# Patient Record
Sex: Female | Born: 1975
Health system: Southern US, Community
[De-identification: ages and names within clinical notes are randomized; demographics above are authoritative.]

## PROBLEM LIST (undated history)

## (undated) DIAGNOSIS — G43909 Migraine, unspecified, not intractable, without status migrainosus: Secondary | ICD-10-CM

## (undated) DIAGNOSIS — R569 Unspecified convulsions: Secondary | ICD-10-CM

## (undated) DIAGNOSIS — F32A Depression, unspecified: Secondary | ICD-10-CM

## (undated) DIAGNOSIS — F410 Panic disorder [episodic paroxysmal anxiety] without agoraphobia: Secondary | ICD-10-CM

## (undated) DIAGNOSIS — F418 Other specified anxiety disorders: Secondary | ICD-10-CM

## (undated) DIAGNOSIS — F329 Major depressive disorder, single episode, unspecified: Secondary | ICD-10-CM

## (undated) DIAGNOSIS — T8859XA Other complications of anesthesia, initial encounter: Secondary | ICD-10-CM

## (undated) HISTORY — PX: DILATION AND CURETTAGE, DIAGNOSTIC / THERAPEUTIC: SUR384

## (undated) HISTORY — DX: Migraine, unspecified, not intractable, without status migrainosus: G43.909

---

## 2007-06-03 ENCOUNTER — Ambulatory Visit: Payer: Self-pay | Admitting: Thoracic Surgery

## 2007-06-16 ENCOUNTER — Inpatient Hospital Stay (HOSPITAL_COMMUNITY): Admission: AD | Admit: 2007-06-16 | Discharge: 2007-06-23 | Payer: Self-pay | Admitting: Thoracic Surgery

## 2007-06-17 ENCOUNTER — Encounter: Payer: Self-pay | Admitting: Thoracic Surgery

## 2007-06-17 HISTORY — PX: SOFT TISSUE TUMOR RESECTION: SHX1054

## 2007-06-17 HISTORY — PX: OTHER SURGICAL HISTORY: SHX169

## 2007-06-19 ENCOUNTER — Ambulatory Visit: Payer: Self-pay | Admitting: Thoracic Surgery

## 2007-07-01 ENCOUNTER — Ambulatory Visit: Payer: Self-pay | Admitting: Thoracic Surgery

## 2007-07-01 ENCOUNTER — Encounter: Admission: RE | Admit: 2007-07-01 | Discharge: 2007-07-01 | Payer: Self-pay | Admitting: Thoracic Surgery

## 2007-07-08 ENCOUNTER — Encounter: Admission: RE | Admit: 2007-07-08 | Discharge: 2007-07-08 | Payer: Self-pay | Admitting: Thoracic Surgery

## 2007-07-08 ENCOUNTER — Ambulatory Visit: Payer: Self-pay | Admitting: Thoracic Surgery

## 2007-07-11 ENCOUNTER — Ambulatory Visit: Payer: Self-pay | Admitting: Cardiothoracic Surgery

## 2007-07-15 ENCOUNTER — Ambulatory Visit: Payer: Self-pay | Admitting: Thoracic Surgery

## 2007-07-22 ENCOUNTER — Ambulatory Visit: Payer: Self-pay | Admitting: Thoracic Surgery

## 2007-07-30 ENCOUNTER — Ambulatory Visit: Payer: Self-pay | Admitting: Thoracic Surgery

## 2007-08-05 ENCOUNTER — Ambulatory Visit (HOSPITAL_COMMUNITY): Admission: RE | Admit: 2007-08-05 | Discharge: 2007-08-05 | Payer: Self-pay | Admitting: Thoracic Surgery

## 2007-08-05 ENCOUNTER — Ambulatory Visit: Payer: Self-pay | Admitting: Thoracic Surgery

## 2007-08-14 ENCOUNTER — Ambulatory Visit: Payer: Self-pay | Admitting: Thoracic Surgery

## 2007-08-26 ENCOUNTER — Ambulatory Visit: Payer: Self-pay | Admitting: Thoracic Surgery

## 2007-09-10 ENCOUNTER — Ambulatory Visit: Payer: Self-pay | Admitting: Thoracic Surgery

## 2007-10-02 ENCOUNTER — Ambulatory Visit: Payer: Self-pay | Admitting: Thoracic Surgery

## 2007-10-29 ENCOUNTER — Ambulatory Visit: Payer: Self-pay | Admitting: Thoracic Surgery

## 2008-10-13 ENCOUNTER — Ambulatory Visit: Payer: Self-pay | Admitting: Thoracic Surgery

## 2008-10-13 ENCOUNTER — Encounter: Admission: RE | Admit: 2008-10-13 | Discharge: 2008-10-13 | Payer: Self-pay | Admitting: Thoracic Surgery

## 2009-04-20 ENCOUNTER — Encounter: Admission: RE | Admit: 2009-04-20 | Discharge: 2009-04-20 | Payer: Self-pay | Admitting: Thoracic Surgery

## 2009-04-20 ENCOUNTER — Ambulatory Visit: Payer: Self-pay | Admitting: Thoracic Surgery

## 2009-05-17 ENCOUNTER — Emergency Department (HOSPITAL_BASED_OUTPATIENT_CLINIC_OR_DEPARTMENT_OTHER): Admission: EM | Admit: 2009-05-17 | Discharge: 2009-05-17 | Payer: Self-pay | Admitting: Emergency Medicine

## 2009-05-17 ENCOUNTER — Ambulatory Visit: Payer: Self-pay | Admitting: Radiology

## 2009-10-19 ENCOUNTER — Encounter: Admission: RE | Admit: 2009-10-19 | Discharge: 2009-10-19 | Payer: Self-pay | Admitting: Thoracic Surgery

## 2009-10-19 ENCOUNTER — Ambulatory Visit: Payer: Self-pay | Admitting: Thoracic Surgery

## 2010-02-02 ENCOUNTER — Ambulatory Visit: Payer: Self-pay | Admitting: Family Medicine

## 2010-02-02 LAB — CONVERTED CEMR LAB: Rapid Strep: NEGATIVE

## 2010-03-07 ENCOUNTER — Ambulatory Visit: Payer: Self-pay | Admitting: Family Medicine

## 2010-03-07 DIAGNOSIS — J069 Acute upper respiratory infection, unspecified: Secondary | ICD-10-CM | POA: Insufficient documentation

## 2010-03-07 DIAGNOSIS — M940 Chondrocostal junction syndrome [Tietze]: Secondary | ICD-10-CM | POA: Insufficient documentation

## 2010-03-07 DIAGNOSIS — D499 Neoplasm of unspecified behavior of unspecified site: Secondary | ICD-10-CM | POA: Insufficient documentation

## 2010-04-02 ENCOUNTER — Ambulatory Visit: Payer: Self-pay | Admitting: Emergency Medicine

## 2010-04-02 DIAGNOSIS — M62838 Other muscle spasm: Secondary | ICD-10-CM

## 2010-04-19 ENCOUNTER — Ambulatory Visit: Payer: Self-pay | Admitting: Thoracic Surgery

## 2010-04-19 ENCOUNTER — Encounter
Admission: RE | Admit: 2010-04-19 | Discharge: 2010-04-19 | Payer: Self-pay | Source: Home / Self Care | Attending: Thoracic Surgery | Admitting: Thoracic Surgery

## 2010-05-21 ENCOUNTER — Encounter: Payer: Self-pay | Admitting: Thoracic Surgery

## 2010-05-30 NOTE — Assessment & Plan Note (Signed)
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  She was lifting heavy baskets of clothes yesterday morning, then went to [employer] and was pushing around a cart for a long time.  Felt constant L shoulder/neck pain, cramping, tightness.  Worse with movement and pressing on that area.  Of note, she slipped and fell on ice 10 months ago and went to ER, normal Xrays, and got better.  However since then she has had intermittant discomfort in that same area.  No neurologic signs or radiation of pain.  Current Meds ADVIL 200 MG TABS (IBUPROFEN) 4 prn TYLENOL 325 MG TABS (ACETAMINOPHEN) 2 prn FLEXERIL 10 MG TABS (CYCLOBENZAPRINE HCL) 1/2-1 tab by mouth up to three times a day as needed for spasms MELOXICAM 7.5 MG TABS (MELOXICAM) 1 tab by mouth two times a day for 5 days, then prn  REVIEW OF SYSTEMS Constitutional Symptoms      Denies fever, chills, night sweats, weight loss, weight gain, and fatigue.  Eyes       Denies change in vision, eye pain, eye discharge, glasses, contact lenses, and eye surgery. Ear/Nose/Throat/Mouth       Denies hearing loss/aids, change in hearing, ear pain, ear discharge, dizziness, frequent runny nose, frequent nose bleeds, sinus problems, sore throat, hoarseness, and tooth pain or bleeding.  Respiratory       Denies dry cough, productive cough, wheezing, shortness of breath, asthma,  bronchitis, and emphysema/COPD.  Cardiovascular       Denies murmurs, chest pain, and tires easily with exhertion.    Gastrointestinal       Denies stomach pain, nausea/vomiting, diarrhea, constipation, blood in bowel movements, and indigestion. Genitourniary       Denies painful urination, kidney stones, and loss of urinary control. Neurological       Denies paralysis, seizures, and fainting/blackouts. Musculoskeletal       Complains of muscle pain and joint pain.      Denies joint stiffness, decreased range of motion, redness, swelling, muscle weakness, and gout.  Skin       Denies bruising, unusual mles/lumps or sores, and hair/skin or nail changes.  Psych       Denies mood changes, temper/anger issues, anxiety/stress, speech problems, depression, and sleep problems.  Past History:  Past Medical History: Reviewed history from 02/02/2010 and no changes required. Unremarkable  Past Surgical History: Reviewed history from 02/02/2010 and no changes required. 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 Mother, Anemia Father, D, MVA  Social History: Reviewed history from 02/02/2010 and no changes required. Married Never Smoked Alcohol use-no Drug  use-no Regular exercise-no Physical Exam General appearance: well developed, obese, no acute distress Neck: FROM, full strength, Spurling neg.  No bony midline tenderness.  Resisted lateral deviation and rotation cause discomfort on the L side Extremities: L shoulder exam normal.  +TTP Left middle and upper trapezius with palpable spasm.  Neurological: grossly intact and non-focal Skin: no obvious rashes or lesions MSE: oriented to time, place, and person Assessment New Problems: MUSCLE SPASM, TRAPEZIUS MUSCLE, LEFT (ICD-728.85)   Patient Education: Patient and/or caregiver instructed in the following: rest.  Plan New Medications/Changes: MELOXICAM 7.5 MG TABS  (MELOXICAM) 1 tab by mouth two times a day for 5 days, then prn  #24 x 0, 04/02/2010, Hoyt Koch MD FLEXERIL 10 MG TABS (CYCLOBENZAPRINE HCL) 1/2-1 tab by mouth up to three times a day as needed for spasms  #15 x 0, 04/02/2010, Hoyt Koch MD  New Orders: Est. Patient Level III [id number] Planning Comments:   Rx for Flexeril and Mobic.  Go buy a heating pad and use a few times a day for 10-15 mins.  Home massage.  Since this seems to be a recurrent problem, also wrote referral to Physical therapy to work on shoulder & neck strength, ultrasound, massage, etc to try to prevent further occurences.  Follow-up with your primary care physician if not improving or if getting worse, but will likely take at least 5-7 days to feel better.  Hold off on Xray since last one was negative.    The patient and/or caregiver has been counseled thoroughly with regard to medications prescribed including dosage, schedule, interactions, rationale for use, and possible side effects and they verbalize understanding.  Diagnoses and expected course of recovery discussed and will return if not improved as expected or if the condition worsens. Patient and/or caregiver verbalized understanding.  Prescriptions: MELOXICAM 7.5 MG TABS (MELOXICAM) 1 tab by mouth two times a day for 5 days, then prn  #24 x 0   Entered and Authorized by:   Hoyt Koch MD   Signed by:   Hoyt Koch MD on 04/02/2010   Method used:   Print then Give to Patient   RxID:   [id number] FLEXERIL 10 MG TABS (CYCLOBENZAPRINE HCL) 1/2-1 tab by mouth up to three times a day as needed for spasms  #15 x 0   Entered and Authorized by:   Hoyt Koch MD   Signed by:   Hoyt Koch MD on 04/02/2010   Method used:   Print then Give to Patient   RxID:   [id number]   Orders Added: 1)  Est. Patient Level III [29528]

## 2010-05-30 NOTE — Assessment & Plan Note (Signed)
Summary: Cough-dryx 2 dys, chest congestion x 6 dys rm 4   Vital Signs:  Patient Profile:   35 Years Old Female CC:      Cold & URI symptoms Height:     64 inches Weight:      250 pounds O2 Sat:      98 % O2 treatment:    Room Air Temp:     98.0 degrees F oral Pulse rate:   83 / minute Pulse rhythm:   regular Resp:     14 per minute BP sitting:   115 / 79  (left arm) Cuff size:   regular  Vitals Entered By: Areta Haber CMA (March 07, 2010 8:48 AM)                History of Present Illness Chief Complaint: Cold & URI symptoms History of Present Illness:  Subjective: Patient complains of URI symptoms that started 6 days ago with scratchy throat and sinus congestion, now improved.  She developed a cough 2 days ago.  She complains of sternal chest discomfort with cough.  She had a thoracic cystic teratoma resected in 2009, and tends to have anterior chest pain with any recurrent URI.  She had episodes of frequent bronchitis prior to her surgery.  Cough is non-productive and worse at night.   No pleuritic pain No wheezing + post-nasal drainage No sinus pain/pressure No itchy/red eyes No earache No hemoptysis No SOB No fever/chills No nausea No vomiting No abdominal pain No diarrhea No skin rashes + fatigue No myalgias No headache Used OTC meds without relief   Current Problems: Hx of TERATOMA (ICD-239.9) COSTOCHONDRITIS, ACUTE (ICD-733.6) URI (ICD-465.9) GROUP B STREPTOCOCCUS INFECTION, FAMILY HX (ICD-V18.8)   Current Meds AZITHROMYCIN 250 MG TABS (AZITHROMYCIN) Two tabs by mouth on day 1, then 1 tab daily on days 2 through 5 BENZONATATE 200 MG CAPS (BENZONATATE) One by mouth hs as needed cough  REVIEW OF SYSTEMS Constitutional Symptoms      Denies fever, chills, night sweats, weight loss, weight gain, and fatigue.  Eyes       Denies change in vision, eye pain, eye discharge, glasses, contact lenses, and eye surgery. Ear/Nose/Throat/Mouth  Denies hearing loss/aids, change in hearing, ear pain, ear discharge, dizziness, frequent runny nose, frequent nose bleeds, sinus problems, sore throat, hoarseness, and tooth pain or bleeding.  Respiratory       Complains of dry cough, wheezing, and shortness of breath.      Denies productive cough, asthma, bronchitis, and emphysema/COPD.      Comments: chest congestion x 6dys Cardiovascular       Denies murmurs, chest pain, and tires easily with exhertion.    Gastrointestinal       Denies stomach pain, nausea/vomiting, diarrhea, constipation, blood in bowel movements, and indigestion. Genitourniary       Denies painful urination, kidney stones, and loss of urinary control. Neurological       Denies paralysis, seizures, and fainting/blackouts. Musculoskeletal       Denies muscle pain, joint pain, joint stiffness, decreased range of motion, redness, swelling, muscle weakness, and gout.  Skin       Denies bruising, unusual mles/lumps or sores, and hair/skin or nail changes.  Psych       Denies mood changes, temper/anger issues, anxiety/stress, speech problems, depression, and sleep problems. Other Comments: cough x 2 dys; Pt has not seen her PCP for this.   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  Extraocular movement is intact.  Conjunctivae are not inflamed.  Ears:  Canals normal.  Tympanic membranes normal.   Nose:  Normal septum.  Normal turbinates, mildly  congested.    No sinus tenderness present.  Pharynx:  Normal  Neck:  Supple.  No adenopathy is present.  No thyromegaly is present  Lungs:  Clear to auscultation.  Breath sounds are equal.  Chest:  Tenderness over mid-sternum Heart:  Regular rate and rhythm without murmurs, rubs, or gallops.  Abdomen:  Nontender without masses or hepatosplenomegaly.  Bowel sounds are present.  No CVA or flank tenderness.  Extremities:  No edema.   Assessment New Problems: Hx of TERATOMA (ICD-239.9) COSTOCHONDRITIS, ACUTE (ICD-733.6) URI (ICD-465.9)  VIRAL URI.  PATIENT PROBABLY MORE LIKELY TO HAVE COMPLICATION OF BACTERIAL BRONCHITIS WITH HER PAST THORACIC SURGICAL HISTORY  Plan New Medications/Changes: BENZONATATE 200 MG CAPS (BENZONATATE) One by mouth hs as needed cough  #12 x 0, 03/07/2010, Donna Christen MD AZITHROMYCIN 250 MG TABS (AZITHROMYCIN) Two tabs by mouth on day 1, then 1 tab daily on days 2 through 5  #6 tabs x 0, 03/07/2010, Donna Christen MD  New Orders: Est. Patient Level IV [04540] Planning Comments:   Begin Z-pack, expectorant/decongestant daytime, cough suppressant at night, increase fluids. Begin Ibuprofen 200mg , 4 tabs every 8 hours with food for anterior chest discomfort. Follow-up with PCP if not improving.   The patient and/or caregiver has been counseled thoroughly with regard to medications prescribed including dosage, schedule, interactions, rationale for use, and possible side effects and they verbalize understanding.  Diagnoses and expected course of recovery discussed and will return if not improved as expected or if the condition worsens. Patient and/or caregiver verbalized understanding.  Prescriptions: BENZONATATE 200 MG CAPS (BENZONATATE) One by mouth hs as needed cough  #12 x 0   Entered and Authorized by:   Donna Christen MD   Signed by:   Donna Christen MD on 03/07/2010   Method used:   Print then Give to Patient   RxID:   [id number] AZITHROMYCIN 250 MG TABS  (AZITHROMYCIN) Two tabs by mouth on day 1, then 1 tab daily on days 2 through 5  #6 tabs x 0   Entered and Authorized by:   Donna Christen MD   Signed by:   Donna Christen MD on 03/07/2010   Method used:   Print then Give to Patient   RxID:   [id number]   Patient Instructions: 1)  May use Mucinex D (guaifenesin with decongestant) twice daily for congestion. 2)  Increase fluid intake, rest. 3)  May use Afrin nasal spray (or generic oxymetazoline) twice daily for about 5 days.  Also recommend using saline nasal spray several times daily and/or saline nasal irrigation. 4)  Followup with family doctor if not improving 7 to 10 days.  Orders Added: 1)  Est. Patient Level IV [13244]

## 2010-05-30 NOTE — Assessment & Plan Note (Signed)
Summary: SORE THROAT/TJ x last night rm 5   Vital Signs:  Patient Profile:   35 Years Old Female CC:      Sorethroat Height:     64 inches Weight:      239 pounds O2 Sat:      100 % O2 treatment:    Room Air Temp:     97.7 degrees F oral Pulse rate:   83 / minute Pulse rhythm:   regular Resp:     16 per minute BP sitting:   113 / 77  (left arm) Cuff size:   regular  Vitals Entered By: Areta Haber CMA (February 02, 2010 5:58 PM)                  Prior Medication List:  No prior medications documented  History of Present Illness Chief Complaint: Sorethroat History of Present Illness: Patient is here for treatment of strep . Her oldest daughter DX today and the rest of the families have alll had sore throats and coughs. She has a sore throat and swo,len glands in thebL eft of her throat.  Current Problems: GROUP B STREPTOCOCCUS INFECTION, FAMILY HX (ICD-V18.8) ACUTE NASOPHARYNGITIS (ICD-460)   REVIEW OF SYSTEMS Constitutional Symptoms      Denies fever, chills, night sweats, weight loss, weight gain, and fatigue.  Eyes       Denies change in vision, eye pain, eye discharge, glasses, contact lenses, and eye surgery. Ear/Nose/Throat/Mouth       Complains of frequent runny nose and sore throat.      Denies hearing loss/aids, change in hearing, ear pain, ear discharge, dizziness, frequent nose bleeds, sinus problems, hoarseness, and tooth pain or bleeding.      Comments: x last night Respiratory       Denies dry cough, productive cough, wheezing, shortness of breath, asthma, bronchitis, and emphysema/COPD.  Cardiovascular       Denies murmurs, chest pain, and tires easily with exhertion.    Gastrointestinal       Denies stomach pain, nausea/vomiting, diarrhea, constipation, blood in bowel movements, and indigestion. Genitourniary       Denies painful urination, kidney stones, and loss of urinary control. Neurological       Denies paralysis, seizures, and  fainting/blackouts. Musculoskeletal       Denies muscle pain, joint pain, joint stiffness, decreased range of motion, redness, swelling, muscle weakness, and gout.  Skin       Denies bruising, unusual mles/lumps or sores, and hair/skin or nail changes.  Psych       Denies mood changes, temper/anger issues, anxiety/stress, speech problems, depression, and sleep problems.  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  Social History: Reviewed history and no changes required. Married Never Smoked Alcohol use-no Drug use-no Regular exercise-no Smoking Status:  never Drug Use:  no Does Patient Exercise:  no Physical Exam General appearance: well developed, well nourished, no acute distress Head: normocephalic, atraumatic Ears: normal, no lesions or deformities Neck: neck supple,  trachea midline, no masses Chest/Lungs: no rales, wheezes, or rhonchi bilateral, breath sounds equal without effort Heart: regular rate and  rhythm, no murmur Skin: no obvious rashes or lesions MSE: oriented to time, place, and person scar over chest where  teratoma was removed Assessment New Problems: GROUP B STREPTOCOCCUS INFECTION, FAMILY HX (ICD-V18.8) ACUTE NASOPHARYNGITIS (ICD-460)  sore throat  nasal pharyngitis  exposure to strep  Plan New Orders: New Patient Level III [04540] Rapid Strep [87880] Bicillin LA 1.2 million units Injection [J0561] Admin of Therapeutic Inj  intramuscular or subcutaneous [96372] Follow Up: Follow up on an as needed basis, Follow up with Primary Physician  The patient and/or caregiver has been counseled thoroughly with regard to medications prescribed including  dosage, schedule, interactions, rationale for use, and possible side effects and they verbalize understanding.  Diagnoses and expected course of recovery discussed and will return if not improved as expected or if the condition worsens. Patient and/or caregiver verbalized understanding.   Patient Instructions: 1)  Please schedule a follow-up appointment as needed. 2)  Please schedule an appointment with your primary doctor in 7to 14 days if not feeeling better.   Medication Administration  Injection # 1:    Medication: Bicillin LA 1.2 million units Injection    Diagnosis: GROUP B STREPTOCOCCUS INFECTION, FAMILY HX (ICD-V18.8)    Route: IM    Site: RUOQ gluteus    Exp Date: 04/29/2012    Lot #: [id number]    Mfr: Brooke Dare    Patient tolerated injection without complications    Given by: Areta Haber CMA (February 02, 2010 7:19 PM)  Orders Added: 1)  New Patient Level III [99203] 2)  Rapid Strep [14782] 3)  Bicillin LA 1.2 million units Injection [J0561] 4)  Admin of Therapeutic Inj  intramuscular or subcutaneous [96372]  Laboratory Results  Date/Time Received: February 02, 2010 6:47 PM  Date/Time Reported: February 02, 2010 6:47 PM   Other Tests  Rapid Strep: negative  Kit Test Internal QC: Negative   (Normal Range: Negative)   Medication Administration  Injection # 1:    Medication: Bicillin LA 1.2 million units Injection    Diagnosis: GROUP B STREPTOCOCCUS INFECTION, FAMILY HX (ICD-V18.8)    Route: IM    Site: RUOQ gluteus    Exp Date: 04/29/2012    Lot #: [id number]    Mfr: Brooke Dare    Patient tolerated injection without complications    Given by: Areta Haber CMA (February 02, 2010 7:19 PM)  Orders Added: 1)  New Patient Level III [99203] 2)  Rapid Strep [30865] 3)  Bicillin LA 1.2 million units Injection [J0561] 4)  Admin of Therapeutic Inj  intramuscular or subcutaneous [78469]

## 2010-08-31 ENCOUNTER — Other Ambulatory Visit: Payer: Self-pay | Admitting: Thoracic Surgery

## 2010-08-31 DIAGNOSIS — J9859 Other diseases of mediastinum, not elsewhere classified: Secondary | ICD-10-CM

## 2010-08-31 DIAGNOSIS — D369 Benign neoplasm, unspecified site: Secondary | ICD-10-CM

## 2010-09-12 NOTE — Assessment & Plan Note (Signed)
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  Her chest x-ray  shows no evidence of recurrence today.  She did ask about the reading on  some mild central airway thickening and I told her that is just some  thickening of her trachea and not to be concerned about this.  We will  see her back again in 6 months with a CT scan.  Her blood pressure is  139/82, pulse 84, respirations 18.  I appreciate the opportunity of  seeing the patient.   Ines Bloomer, M.D.  Electronically Signed   DPB/MEDQ  D:  04/19/2010  T:  04/19/2010  Job:  [id number]

## 2010-09-12 NOTE — Op Note (Signed)
NAMECHRYSTINE, FROGGE                  ACCOUNT NO.:  [id number]   MEDICAL RECORD NO.:  [id number]          PATIENT TYPE:  INP   LOCATION:  2302                         FACILITY:  [facility]   PHYSICIAN:  Ines Bloomer, M.D. DATE OF BIRTH:  03-31-1976   DATE OF PROCEDURE:  06/17/2007  DATE OF DISCHARGE:                               OPERATIVE REPORT   PREOPERATIVE DIAGNOSIS:  Probable cystic teratoma.   POSTOPERATIVE DIAGNOSIS:  Cystic teratoma.   OPERATION PERFORMED:  Median sternotomy, resection of cystic teratoma  with pump standby.   SURGEON:  Ines Bloomer, M.D.   CO SURGEON:  Evelene Croon, M.D.   ANESTHESIA:  General anesthesia.   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  It was stuck to the fifth third  intercostal space as well as the lung and was compressing the right  mainstem bronchus.  It was multiloculated but at least 15 x 15 cm with  diameter at the largest space.   PROCEDURE IN DETAIL:  She was brought to the operating room for  resection.  After general anesthesia she was prepped and draped in the  usual sterile manner.  Midline incision was made and dissection was  carried down through the subcutaneous tissue to the sternum.  The  sternum was divided with saw and Kinney retractor was inserted with deep  blades.  The patient was obese and had large breasts which further  complicated the situation.  Dissection was first started up around the  superior to innominate vein and the part of the thymus gland was  dissected off and then we realized that the tumor was just enormous.  It  was involving the right lateral wall of the pericardium and pressing on  the right atrium.  We first freed it up superiorly off the chest wall  and then also superiorly up off the thymus  gland, resecting portions of  the thymus gland and then it went down inferiorly to the cardiophrenic  angle at the diaphragm and we dissected it off there.  After it had been  dissected with both sharp and blunt dissection as much as possible we  then opened the pericardium and found that there was a marked amount of  pericardial adhesions requiring careful taking down of the adhesions  with electrocautery as well as by sharp dissection.  We identified the  atrial appendage and dissected the atrial appendage off of the  pericardium and then inferiorly we divided the pericardium down to the  inferior vena cava.  Superiorly we freed up the ascending arch as well  as the superior vena cava but it was obvious that the cyst was stuck to  the right atrium such that removing the cyst off the right atrium could  possibly cause catastrophic bleeding.  We also identified that it was  stuck to the phrenic nerve over a long  period of time.  There was a  small cyst superiorly which we resected and it was approximately 4 cm in  size.  Frozen section was benign.  So we elected to open this  mediastinal mass and remove at least 1 liter of proteinaceous material  that had calcium in it and we sent part of it for culture although it  did not appear to be infected.  After we decompressed the mass cyst we  then resected the cyst leaving approximately 3-5 cm in diameter on the  right atrium and approximately 2 cm along the phrenic nerve.  We were  able to get it off the lung although we had leaking from the lung that  had to be oversewn with 3-0 Vicryl.  Finally after a long tedious  dissection the entire mass was removed and frozen section revealed it  was a cystic teratoma.  We coagulated the area over the right atrium.  We irrigated copiously, irrigated all material and then put in 3 chest  tubes, a straight 28 chest tube in the right chest, a right angle chest  tube and a straight 36 chest tube around the  mediastinum and then closed  the chest with double stranded wires in a twisted fashion.  The fascia  with #1 Dexon, subcutaneous tissue with 2-0 Dexon and 3-0 Vicryl at  subcutaneous.  Steri-Strips were also applied.  The patient was returned  on the ventilator to the SICU in stable condition.      Ines Bloomer, M.D.  Electronically Signed     DPB/MEDQ  D:  06/17/2007  T:  06/18/2007  Job:  [id number]   cc:   Evelene Croon, M.D.

## 2010-09-12 NOTE — Assessment & Plan Note (Signed)
OFFICE VISIT   Regina [name]  DOB:  [birth year]                                        August 26, 2007  CHART #:  [id number]   SUBJECTIVE:  The patient comes back today postoperative secondary wound  closure which was done on August 05, 2007 by Dr. Edwyna [name].  The patient is  without complaints.  She denies fever, drainage, opening of wound.  She  states that she has been treated recently for strep throat.   PHYSICAL EXAMINATION:  Vital Signs:  Blood pressure 120/82, pulse 94,  respirations 18, O2 sat 97%.  Respiratory:  Clear to auscultation  bilaterally.  Cardiac:  Regular rate and rhythm.  Wound:  The distal  sternal wound is healing well.  No cellulitis or purulent drainage.  Good granulation tissue noted.   IMPRESSION AND PLAN:  The patient is status post secondary wound  closure.  Dr. Edwyna [name] saw and evaluated the patient.  Remaining sutures  were removed.  Silver nitrate applied.  The patient tolerated it well.  Refill on Lortab was given 7.5/500, total of 30.  The patient is  released to return to work.  We will plan to follow up with her in 2  weeks.  She is instructed that if she develops fevers, opening or  drainage from her wound, she is to contact us prior.  The patient  acknowledged her understanding.   Regina [name], M.D.  Electronically Signed   KMD/MEDQ  D:  08/26/2007  T:  08/26/2007  Job:  [id number]   cc:   Regina [name], M.D.

## 2010-09-12 NOTE — Assessment & Plan Note (Signed)
OFFICE VISIT   Regina [name], Regina [name]  DOB:  11/07/1975                                        July 22, 2007  CHART #:  [id number]   The patient is status post resection of cystic teratoma on June 17, 2007.  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  The patient was last seen July 15, 2007.  The patient was switched to Keflex and doxycycline.  She returns  today for further evaluation.  The patient is without complaint.   PHYSICAL EXAMINATION:  VITAL SIGNS:  Blood pressure 132/88, pulse 98,  respirations 20, O2 saturation 99% on room air.  SKIN:  The patient's distal sternal incision is clean with good  granulation tissue.  There was no cellulitis or purulent drainage noted.  One stitch was removed from the wound.  Noted to be healing well.   IMPRESSION AND PLAN:  The patient seen with sternal wound which is  healing well.  Dr. Edwyna Shell evaluated the patient.  He discussed with  patient following back up in one week for delayed closure of her distal  sternal wound.  She is to continue Keflex and doxycycline.  Wound was  redressed with a wet dressing and bandaged with sterile gauze.  The  patient was instructed if she develops any fevers, increased pain,  opening drainage, erythema around wound, she is to contact our office.  Will plan to see the patient in one week.   Ines Bloomer, M.D.  Electronically Signed   KMD/MEDQ  D:  07/22/2007  T:  07/22/2007  Job:  [id number]

## 2010-09-12 NOTE — Letter (Signed)
October 13, 2008   Gwendlyn Deutscher, MD  [address]  [place], [place] [address]   Re:  WAYNETTE, TOWERS                  DOB:  11-May-1975   Dear Dr. Jeanie Sewer:   I saw the patient back today 1 year after her surgery and her wounds are  all healed.  Blood pressure was 130/70, pulse 78, respirations 18, and  saturations were 97%.  Lungs were clear to auscultation and percussion.  Doing well overall.  CT scan showed no evidence of recurrence of her  teratoma.  Because of this we will continue to follow her with CT scans.  I will see her in 6 months with a chest x-ray and then we will probably  repeat her CT scan in 1 year.  By at least 1 year, things look good.  I  appreciate the opportunity of seeing the patient.   Sincerely,   Ines Bloomer, M.D.  Electronically Signed   DPB/MEDQ  D:  10/13/2008  T:  10/14/2008  Job:  [id number]

## 2010-09-12 NOTE — Assessment & Plan Note (Signed)
OFFICE VISIT   [name], [name]  DOB:  May 14, 1975                                        Sep 10, 2007  CHART #:  [id number]   Patient returns today and I examined the area of secondary healing in  her lower portion of her sternal incision.  There still was some  granulation tissue, but it was healing better and was clean and I think  should go ahead and heal completely within the next week or so.  I will  see her back in three weeks for a final check.   Ines Bloomer, M.D.  Electronically Signed   DPB/MEDQ  D:  09/10/2007  T:  09/10/2007  Job:  [id number]

## 2010-09-12 NOTE — H&P (Signed)
NAME:  Regina [name], Regina [name]               ACCOUNT NO.:  [id number]   MEDICAL RECORD NO.:  [id number]           PATIENT TYPE:   LOCATION:                                 FACILITY:   PHYSICIAN:  Ines Bloomer, M.D.      DATE OF BIRTH:   DATE OF ADMISSION:  DATE OF DISCHARGE:                              HISTORY & PHYSICAL   CHIEF COMPLAINT:  Shortness of breath.   HISTORY OF PRESENT ILLNESS:  This 35 year old Caucasian female had a  chest x-ray and subsequent CT scan because of shortness of breath.  She  also has morbid obesity.  A CT scan showed a 12 cm anterior mediastinal  mass with cystic in nature and areas of calcification.  It was growing  into the second intercostal space and there was some compression of the  superior vena cava and trachea.  She has had no recent weight loss,  fever, chills, excessive sputum, or hemoptysis.  Pulmonary function test  showed an FVC of 2.12, with an FEV1 of 1.71 or 59% of predicted.   ALLERGIES:  No known drug allergies.   PAST SURGICAL HISTORY:  No previous surgeries.   MEDICATIONS:  Doxycycline.   FAMILY HISTORY:  Noncontributory.   SOCIAL HISTORY:  She is married to a Engineer, civil (consulting).  She has three children,  works in [employer], and does not drink alcohol on a regular  basis.  She does not smoke.   REVIEW OF SYSTEMS:  She is 235 pounds and she is 5 feet 3 inches.  CARDIOVASCULAR:  She has shortness of breath with exertion and some  tightness.  No angina or atrial fibrillation.  PULMONARY:  She has had a  cough and bronchitis, rhinitis.  GASTROINTESTINAL:  No nausea, vomiting,  constipation, diarrhea, or GERD.  GENITOURINARY:  No kidney disease,  dysuria, or frequent urination.  VASCULAR:  No claudication, DVT, TIA.  NEUROLOGY:  No headaches, blackouts, or seizures.  MUSCULOSKELETAL:  No  joint pain or rash.  PSYCHIATRIC:  No psychiatric illness.  HEENT:  No  changes in her eye sight or hearing.  No problems with bleeding or  clotting  disorders or anemia.   PHYSICAL EXAMINATION:  GENERAL:  She is an obese Caucasian female in no  acute distress.  VITAL SIGNS:  Blood pressure 145/86, pulse 99, respirations 18, and  saturations are 92%.  HEENT:  Atraumatic.  Pupils equal, round, and reactive to light and  accommodation.  Extraocular movements normal.  Ears; tympanic membranes  are intact.  Nose; there is no septal deviation.  Throat is without  lesion.  Uvula is in the midline.  NECK:  Supple without thyromegaly.  No supraclavicular or axillary  adenopathy.  CHEST:  There are decreased breath sounds in the right upper lobe,  normal breath sounds on the left.  HEART:  Regular sinus rhythm with no murmurs.  BREASTS:  No masses, but they are pendulous.  ABDOMEN:  Obese.  There is no hepatosplenomegaly.  Bowel sounds are  normal.  There are no masses.  EXTREMITIES:  1+ pulses and no edema.  NEUROLOGY:  She is oriented x3.  Sensory and motor intact.  Cranial  nerves II-XII grossly intact.   IMPRESSION:  1. Right upper lobe anterior mediastinal mass, rule out a cystic      teratoma.  2. Morbid obesity.   PLAN:  Median sternotomy with resection, possible pump standby.      Ines Bloomer, M.D.  Electronically Signed     DPB/MEDQ  D:  06/11/2007  T:  06/12/2007  Job:  [id number]

## 2010-09-12 NOTE — Letter (Signed)
October 19, 2009   Weston Settle, MD  [address]  [place], [place] [address]   Re:  Regina [name], GARDUNO                  DOB:  16-Dec-1975   Dear Dr. Melvyn Neth:   I saw the patient back today.  We repeated her CT scan.  She is now  almost 2 years since we did a surgery.  There is no evidence of  recurrence of her immature teratoma.  Her incisions are well healed.  Her blood pressure was 120/82, pulse 63, respirations 18, and sats were  99%.  She still complains of cold sensitivity and dysesthesias where we  did her median sternotomy.  I plan to see her back again in 6 months  with a chest x-ray.   Ines Bloomer, M.D.  Electronically Signed   DPB/MEDQ  D:  10/19/2009  T:  10/19/2009  Job:  [id number]   cc:   Gwendlyn Deutscher II, M.D.

## 2010-09-12 NOTE — Assessment & Plan Note (Signed)
OFFICE VISIT   [name], [name]  DOB:  12-04-1975                                        July 15, 2007  CHART #:  [id number]   The patient came today and removed 1 suture from her wound.  It seems to  be starting to granulate well and looks clean.  We switched her to  Keflex and doxycycline as this was MRSA and we will continue to refill  her pain medications.  I will see her back again in 1 week for wound  check.   Ines Bloomer, M.D.  Electronically Signed   DPB/MEDQ  D:  07/15/2007  T:  07/16/2007  Job:  [id number]

## 2010-09-12 NOTE — Op Note (Signed)
Regina [name], Regina [name]                  ACCOUNT NO.:  [id number]   MEDICAL RECORD NO.:  [id number]          PATIENT TYPE:  AMB   LOCATION:  SDS                          FACILITY:  [facility]   PHYSICIAN:  Ines Bloomer, M.D. DATE OF BIRTH:  [birth year]   DATE OF PROCEDURE:  08/05/2007  DATE OF DISCHARGE:                               OPERATIVE REPORT   PREOPERATIVE DIAGNOSIS:  Status post resection of cystic teratoma with  partial wound dehiscence, median sternotomy.   POSTOPERATIVE DIAGNOSIS:  Status post resection of cystic teratoma with  partial wound dehiscence, median sternotomy.   OPERATION PERFORMED:  Secondary wound closure.   SURGEON:  Dr. Patricia Nettle. Bur[place].   ANESTHESIA:  General anesthesia.   After adequate general anesthesia, the area of wound necrosis that was  near the xiphoid was prepped and draped in usual sterile manner.  It was  very clean and it was closed with interrupted #1 Prolene and 0 Vicryl  using alternating stitches.  The patient tolerated the procedure well  and was returned to the recovery room in stable condition.      Ines Bloomer, M.D.  Electronically Signed     DPB/MEDQ  D:  08/05/2007  T:  08/05/2007  Job:  [id number]

## 2010-09-12 NOTE — Letter (Signed)
April 20, 2009   DeQuincy A. Melvyn Neth, MD  [address]  [place], [place] [address]   Re:  MAYZEE, REICHENBACH                  DOB:  06-25-1975   Dear Dr. Melvyn Neth,   I saw the patient back today and checked her chest x-ray.  There is no  evidence of recurrence of her cancer.  Incision is well healed.  She is  doing well overall.  Her blood pressure is 126/90, pulse 76,  respirations 18.  I have rescheduled for another CT scan in June.  I  appreciate the opportunity of seeing the patient.   Sincerely,   Ines Bloomer, M.D.  Electronically Signed   DPB/MEDQ  D:  04/20/2009  T:  04/20/2009  Job:  [id number]

## 2010-09-12 NOTE — Assessment & Plan Note (Signed)
OFFICE VISIT   Regina [name], Regina [name]  DOB:  [birth year]                                        July 30, 2007  CHART #:  [id number]   [name] came today and her wounds continue to heal well.  I think it  is time for a secondary wound closure and I will plan to do this on  April 7 under light, general anesthesia at [facility].  We will get  an EKG, chest x-ray and lab work prior to her surgery.   Ines Bloomer, M.D.  Electronically Signed   DPB/MEDQ  D:  07/30/2007  T:  07/30/2007  Job:  [id number]

## 2010-09-12 NOTE — Letter (Signed)
July 01, 2007   Gwendlyn Deutscher, M.D.  [address]  [place], [place] [address]   Re:  Regina, SHANDS                  DOB:  03-13-1976   Dear Dr. Jeanie Sewer:   I saw [name] back for her first postoperative visit.  We did a median  sternotomy and resection of a gigantic cystic teratoma which was benign.  We did have to leave part of it on the right atrium, but that should not  cause any problems.  Her chest x-ray today showed normal postoperative  changes.  Her incision was well-healed except for one small area in the  inferior portion of the sternum which is just a very superficial area  which should heal without difficulty.  We removed her chest tube sutures  and they are healing well.  I appreciate the opportunity of seeing this  very interesting case.  This is one of the largest mediastinal masses  that I have ever had to remove growing into the right chest and taking  it off the heart.  I will see her back again in one week.  Her blood  pressure is 119/78, pulse 96, respirations 97.  Lungs are clear to  auscultation and percussion.   Ines Bloomer, M.D.  Electronically Signed   DPB/MEDQ  D:  07/01/2007  T:  07/01/2007  Job:  [id number]

## 2010-09-12 NOTE — Discharge Summary (Signed)
Regina [name], Regina [name]                  ACCOUNT NO.:  [id number]   MEDICAL RECORD NO.:  [id number]          PATIENT TYPE:  INP   LOCATION:  3308                         FACILITY:  [facility]   PHYSICIAN:  Ines Bloomer, M.D. DATE OF BIRTH:  1975-10-10   DATE OF ADMISSION:  06/16/2007  DATE OF DISCHARGE:                               DISCHARGE SUMMARY   FINAL DIAGNOSIS:  Cystic teratoma.   IN-HOSPITAL DIAGNOSES:  Acute blood loss anemia postoperatively.   SECONDARY DIAGNOSES:  History of anxiety.   IN-HOSPITAL OPERATIONS AND PROCEDURES:  Median sternotomy with resection  of cystic teratoma with pump standby.   HISTORY AND PHYSICAL AND HOSPITAL COURSE:  The patient is 35 year old  Caucasian female who had a chest x-ray and had subsequent CT scan  secondary to shortness of breath.  CT scan showed a 12 cm anterior  mediastinal mass that was cystic in nature and areas of calcification.  It was growing into the second intercostal space, and there was some  compression of the superior vena cava and trachea.  The patient has had  no recent weight loss, fevers, chills, excessive sputum hemoptysis.  PFTs done showed an FVC of 2.12 with an FEV-1 of 1.7 or 59% of  predicted.  The patient was seen and evaluated by Dr. Edwyna Shell.  Dr.  Edwyna Shell discussed with the patient undergoing resection of the cystic  teratoma.  He discussed risks and benefits with the patient.  The  patient acknowledged her understanding and agreed to proceed.  Surgery  was scheduled for June 17, 2007.   PAST MEDICAL HISTORY AND PHYSICAL EXAMINATION:  For details, please see  dictated H&P.   The patient was taken to the operating room on June 17, 2007 where  she underwent median sternotomy and resection of cystic teratoma with  pump standby.  The patient tolerated this procedure and was transferred  to the intensive care unit in stable condition.  The patient was noted  to be stable with a low hematocrit of 27%.  She  received 1 unit of  packed red blood cells at this time.  The patient remained intubated  overnight.   By postoperative day #1, the patient was stable and was ready to be  weaned and extubated.  She was extubated without difficulty.  Post  extubation, the patient was noted to be alert and oriented x4.  Neurologically intact.  She was placed on 2 liters nasal cannula.  The  patient's hemoglobin and hematocrit continued to be monitored.  It  continued to be low.  The patient received another unit on postoperative  day #2, and the third unit on postoperative day #3.  The patient was  eventually started on p.o. iron.  The patient's hemoglobin and  hematocrit were followed closely.  By June 22, 2007, she was stable  with a hemoglobin of 9.6.  Postoperatively, the patient had daily chest  x-rays obtained.  These were stable.  The patient had minimal drainage  from the mediastinal tube, and this was able to be discontinued on  postoperative day #2.  Follow-up chest x-ray  remained stable.  The  patient was encouraged to use her incentive spirometer and was able to  be weaned off oxygen, sating greater than 90% on room air.  Postoperatively. the patient was ambulating well without difficulty.  She was tolerating diet well.  No nausea or vomiting noted.  She is  tentatively ready for discharge home in the a.m., June 23, 2007.  On  June 22, 2007, the patient's vital signs were noted to be stable.  She was afebrile.  She was satting greater than 90% on room air.  The  patient will need to obtain PA and lateral chest x-ray 30 minutes prior  to her appointment.  Laboratories on June 22, 2007 showed a white  count of 10.1, hemoglobin 9.6, hematocrit 28.1, platelet count of 301.  Sodium was 136, potassium 3.2, chloride of 99, bicarbonate of 28, BUN of  9, creatinine 0.51, glucose 110.   FOLLOW-UP APPOINTMENTS:  Follow-up appointment will be arranged with Dr.  Edwyna Shell for in 1 week.  Our  office will contact the patient with this  information.  The patient will need to obtain PA and lateral chest x-ray  30 minutes prior to this appointment.   ACTIVITY:  Patient instructed no driving until released to do so, no  heavy lifting over 10 pounds.  She is told to ambulate 3-4 times per day  and progress as tolerated.  Continue breathing exercises.   INCISIONAL CARE:  The patient is told to shower, washing her incisions  using soap and water.  She is to contact the office if she develops any  drainage or opening from any of her incision sites.   DIET:  The patient was educated on diet to be low-fat, low-salt.   DISCHARGE MEDICATIONS:  1. Niferex 150 mg daily.  2. Xanax 0.25 mg p.r.n.  3. Percocet 5/325, 1-2 tablets q.4-6h. p.r.n.      Theda Belfast, [place]      Ines Bloomer, M.D.  Electronically Signed    KMD/MEDQ  D:  06/22/2007  T:  06/23/2007  Job:  [id number]

## 2010-09-12 NOTE — Letter (Signed)
June 03, 2007   Gwendlyn Deutscher, M.D.  [address]  [place], [place] [address]   Re:  KILA, GODINA                 DOB:  1976/01/30   Dear Dr. Jeanie Sewer:   I appreciate the opportunity of seeing Ms. Regina [name].  This 35 year old  Caucasian female obtained a chest x-ray and then a subsequent CT scan.  She had had some shortness of breath.  She also has morbid obesity.  The  CT scan showed a large 12 cm anterior mediastinal mass that was cystic  in nature and had some areas of calcification.  It was growing into the  second intercostal space and also had compression on the superior vena  cava and trachea.  She has had no recent weight loss, no hemoptysis,  fever, chills or excessive sputum.  Pulmonary function tests showed an  FVC of 2.12 with an FEV1 of 1.71 or 59% of predicted.  She has had no  change in her weight.   Her medications include doxicycline.  She has no allergies.  No previous  surgery.   FAMILY HISTORY:  Noncontributory.   SOCIAL HISTORY:  She is married to a Engineer, civil (consulting).  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  She does not smoke or drink alcohol on a  regular basis.   REVIEW OF SYSTEMS:  Her weight is 235, 5 feet 3 inches.  CARDIAC:  She gets shortness of breath with exertion and some chest  tightness.  No angina or atrial fibrillation.  PULMONARY:  She has a cough and bronchitis.  No hemoptysis or asthma.  GI:  No nausea, vomiting, constipation or diarrhea.  GU:  No kidney disease or dysuria.  VASCULAR:  No claudication, DVTs, TIAs.  NEUROLOGICAL:  No headaches or seizures.  MUSCULOSKELETAL:  No arthritis, joint pain or rash.  PSYCHIATRIC:  No psychiatric illnesses.  EYE/ENT:  No change in eyesight or hearing.  HEMOLOGICAL:  No problems with bleeding or clotting disorders.   PHYSICAL EXAMINATION:  She is an obese, Caucasian female in no acute  distress.  Her blood pressure is 145/86, pulse 99, respiration s18.  Saturations were 92%.  Head, ears, eyes,  nose and throat are  unremarkable.  Neck:  Supple without thyromegaly.  There is no  supraclavicular axial adenopathy.  Chest:  There are decreased breath  sounds in the right upper lobe, normal breath sounds on  the left.  Heart:  Regular sinus rhythm, no murmurs.  Breasts:  No masses, but very  pendulous and very large breasts.  Abdomen:  Obese.  There is  hepatosplenomegaly.  Bowel sounds are normal.  No palpable masses.  Extremities:  There are 1+ pulses.  There is no edema.  Neurological:  She is oriented x3.  Sensory and motor are intact.  Cranial nerves  intact.   The CT scan is very suggestive that his is a cystic teratoma,  particularly with calcifications in the cystic mass.  She will require a  median sternotomy and full resection, and hopefully it can be completely  resected.   We plan to do this on the 17th at [facility] with pump stand-by.  I  have explained the risks of the procedure to her and she agrees with  this.  I also explained the risk of possible infection and wound  problems, given her morbid obesity.   I appreciate the opportunity of seeing Ms. Regina [name].  Ines Bloomer, M.D.  Electronically Signed   DPB/MEDQ  D:  06/03/2007  T:  06/04/2007  Job:  [id number]   cc:   Weston Settle, MD

## 2010-09-12 NOTE — Assessment & Plan Note (Signed)
OFFICE VISIT   [name], [name]  DOB:  10/20/1975                                        August 14, 2007  CHART #:  [id number]   She came back today after a secondary wound closure.  We removed about  two thirds of the sutures, getting every other one.  The wound is  healing much better than I thought it was.  We will see her back in a  week to remove the rest of the sutures.   Her blood pressure was 118/76, pulse 96, respirations 18, sats were 100.   Ines Bloomer, M.D.  Electronically Signed   DPB/MEDQ  D:  08/14/2007  T:  08/14/2007  Job:  [id number]

## 2010-09-12 NOTE — Assessment & Plan Note (Signed)
OFFICE VISIT   [name], [name]  DOB:  11-29-1975                                        July 11, 2007  CHART #:  [id number]   CURRENT PROBLEMS:  1. Status post mediastinotomy for resection of a cystic benign      teratoma by Dr. Edwyna Shell on June 16, 2007.  2. Superficial necrosis of the lower sternal incision.   HISTORY OF PRESENT ILLNESS:  This patient is a 35 year old female who  underwent a sternotomy for resection of a large cystic teratoma which  was benign.  She has been followed in the office with a non-healing area  of the lower sternal incision.  The patient has noticed increased  drainage and she presents to the office today for wound check.  She  denies fever.  She denies shortness of breath.   PHYSICAL EXAMINATION:  Vital signs:  Blood pressure 130/70, pulse 90,  saturation 98%, temperature 98 degrees.  General:  She is alert and  calm.  Lungs:  Breath sounds are clear.  Cardiac:  Rhythm is regular.  Chest:  The upper sternal incision is stable and well healed.  The lower  part of the sternal incision at the inter breast segment has some skin  separation with a subcutaneous suture showing and some fat necrosis.   This area is examined and a Q-Tip can pass approximately 2-cm in a  tunnel going superiorly.  The exposed suture is removed and some  necrotic fat is debrided and a 2 x 2 wet-to-dry dressing is applied and  packed into the defect.   PLAN:  The patient will continue b.i.d. wet-to-dry dressing changes at  home.  Her husband is a Engineer, civil (consulting).  She has materials at home.  I have  provided her with a prescription for Keflex 500 t.i.d.  x7 days.  She will let us know if she has increased drainage, fever, or  more discomfort.  She will keep her prearranged appointment with Dr.  Edwyna Shell for a wound check later this month.   Kerin Perna, M.D.  Electronically Signed   PV/MEDQ  D:  07/11/2007  T:  07/11/2007  Job:  [id number]

## 2010-09-12 NOTE — Letter (Signed)
October 19, 2009   To Whom It May Concern.   Re:  [name], [name]                  DOB:  03-10-1976   The patient underwent extensive thoracic surgery including a median  sternotomy approximately 2 years ago.  She still has residual  dysesthesias from her incision secondary to her surgery and this causes  her particularly cold sensitivity such that her being outside causes her  discomfort and she also has some pain from her surgical incisions.  Because of which, it would be best to minimize her exposure to cold  particularly in the upcoming winter.  If you have any questions, please  call me at the above number.  We will continue her long-term followup.   Ines Bloomer, M.D.  Electronically Signed   DPB/MEDQ  D:  10/19/2009  T:  10/20/2009  Job:  [id number]

## 2010-09-12 NOTE — Assessment & Plan Note (Signed)
OFFICE VISIT   RAINN, ZUPKO L  DOB:  Jul 06, 1975                                        October 02, 2007  CHART #:  [id number]   Ms. Escudero comes in today for a three week followup.  She is status post  a secondary wound closure of the sternum on 08/05/2007.  She has had  some difficulty with healing in the lower portion of the sternum and has  been doing local wound care.  She has remained stable since her previous  office visit.   PHYSICAL EXAMINATION:  Blood pressure is 124/64.  Pulse is 94,  respirations 18.  Oxygen saturation 97% on room air.  The upper portion  of the sternum is well healed.  The distal portion of the sternum shows  an area that appears to be clean and granulating, but is still not  completely closed.  There is no drainage or purulence, and no erythema  surrounding the area.   ASSESSMENT/PLAN:  Dr. Edwyna Shell has seen and evaluated the patient today,  and has treated this area with topical silver nitrate.  We will plan on  seeing her back again in three more weeks for followup.  She is to  continue local wound care in the interim.   Ines Bloomer, M.D.  Electronically Signed   GC/MEDQ  D:  10/02/2007  T:  10/02/2007  Job:  [id number]

## 2010-09-12 NOTE — Assessment & Plan Note (Signed)
OFFICE VISIT   [name], [name]  DOB:  [birth year]                                        July 08, 2007  CHART #:  [id number]   The patient came today.  Her chest x-ray was stable.  Blood pressure was  134/81.  Pulse 100.  Respirations 16.  Sats were 93%.  Her sternum was  stable.  The area at the distal part of her incision was healing better  and was doing better and the 1 chest tube site was also starting to  heal.  I think it will heal without difficulty.  There is no evidence of  the infection.  She was taking pain medication on a very regular basis  and we cautioned her to start cutting back on her pain medication.  We  did give her a refill for Lortab 7.5/500 at [facility].   Ines Bloomer, M.D.  Electronically Signed   DPB/MEDQ  D:  07/08/2007  T:  07/09/2007  Job:  [id number]

## 2010-09-12 NOTE — Assessment & Plan Note (Signed)
OFFICE VISIT   [name], Regina [name]  DOB:  12/02/1975                                        October 29, 2007  CHART #:  [id number]   The patient came for followup today.  Her incisions are well healed.  She is feeling much better.  Blood pressure is 137/89, pulse 98,  respirations 18, and sats were 98%.  She looks great.  Incision is  well  healed, and I will see her back again in September, and we will get her  first CT scan for followup and then we will do CT scans yearly.   Ines Bloomer, M.D.  Electronically Signed   DPB/MEDQ  D:  10/29/2007  T:  10/30/2007  Job:  [id number]

## 2010-10-10 ENCOUNTER — Other Ambulatory Visit: Payer: Self-pay

## 2010-10-10 ENCOUNTER — Ambulatory Visit: Payer: Self-pay | Admitting: Thoracic Surgery

## 2010-10-11 ENCOUNTER — Ambulatory Visit (INDEPENDENT_AMBULATORY_CARE_PROVIDER_SITE_OTHER): Payer: BC Managed Care – PPO | Admitting: Thoracic Surgery

## 2010-10-11 ENCOUNTER — Ambulatory Visit
Admission: RE | Admit: 2010-10-11 | Discharge: 2010-10-11 | Disposition: A | Discharge status: Home / Self Care | Payer: BC Managed Care – PPO | Source: Ambulatory Visit | Attending: Thoracic Surgery | Admitting: Thoracic Surgery

## 2010-10-11 DIAGNOSIS — D369 Benign neoplasm, unspecified site: Secondary | ICD-10-CM

## 2010-10-11 DIAGNOSIS — D150 Benign neoplasm of thymus: Secondary | ICD-10-CM

## 2010-10-11 DIAGNOSIS — J9859 Other diseases of mediastinum, not elsewhere classified: Secondary | ICD-10-CM

## 2010-10-12 NOTE — Assessment & Plan Note (Signed)
OFFICE VISIT  Regina [name], Regina [name] DOB:  07-Mar-1976                                        October 11, 2010 CHART #:  [id number]  [name] comes today.  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  She is doing well overall.  Her blood pressure is 122/78, pulse 80, respirations 20, sats were 98%.  She looks good.  CT scan showed no evidence of recurrence, although we do not have the final report.  We will of course contact her should she need to have anything else.  We will contact her if there is any change in the final report and I will see her again in the year with another CT scan.  Ines Bloomer, M.D. Electronically Signed  DPB/MEDQ  D:  10/11/2010  T:  10/12/2010  Job:  [id number]

## 2011-01-19 LAB — AFB CULTURE WITH SMEAR (NOT AT ARMC): Acid Fast Smear: NONE SEEN

## 2011-01-19 LAB — CROSSMATCH: ABO/RH(D): A POS

## 2011-01-19 LAB — BASIC METABOLIC PANEL
BUN: 5 — ABNORMAL LOW
CO2: 21
CO2: 23
Calcium: 7.8 — ABNORMAL LOW
Chloride: 106
Chloride: 99
Creatinine, Ser: 0.52
GFR calc Af Amer: >60
GFR calc Af Amer: >60
GFR calc non Af Amer: >60
GFR calc non Af Amer: >60
GFR calc non Af Amer: >60
Glucose, Bld: 102 — ABNORMAL HIGH
Glucose, Bld: 110 — ABNORMAL HIGH
Glucose, Bld: 137 — ABNORMAL HIGH
Potassium: 3.2 — ABNORMAL LOW
Potassium: 3.2 — ABNORMAL LOW
Potassium: 3.7
Potassium: 4.1
Sodium: 133 — ABNORMAL LOW
Sodium: 134 — ABNORMAL LOW
Sodium: 136

## 2011-01-19 LAB — URINALYSIS, ROUTINE W REFLEX MICROSCOPIC
Hgb urine dipstick: NEGATIVE
Nitrite: NEGATIVE
Specific Gravity, Urine: 1.018
Urobilinogen, UA: 0.2

## 2011-01-19 LAB — POCT I-STAT 3, ART BLOOD GAS (G3+)
Acid-base deficit: 1
Acid-base deficit: 3 — ABNORMAL HIGH
Bicarbonate: 19.2 — ABNORMAL LOW
Bicarbonate: 20
Bicarbonate: 21.2
Bicarbonate: 23.1
O2 Saturation: 100
O2 Saturation: 95
Operator id: 299391
Patient temperature: 99.4
TCO2: 20
TCO2: 21
TCO2: 22
pCO2 arterial: 25.2 — ABNORMAL LOW
pCO2 arterial: 38.1
pCO2 arterial: 38.1
pH, Arterial: 7.333 — ABNORMAL LOW
pO2, Arterial: 109 — ABNORMAL HIGH
pO2, Arterial: 262 — ABNORMAL HIGH
pO2, Arterial: 82
pO2, Arterial: 93

## 2011-01-19 LAB — FUNGUS CULTURE W SMEAR

## 2011-01-19 LAB — COMPREHENSIVE METABOLIC PANEL
ALT: 10
ALT: 18
AST: 17
AST: 9
Albumin: 2.2 — ABNORMAL LOW
Albumin: 3.3 — ABNORMAL LOW
Alkaline Phosphatase: 108
Alkaline Phosphatase: 52
BUN: 8
CO2: 24
Chloride: 105
Chloride: 105
GFR calc Af Amer: >60
GFR calc Af Amer: >60
GFR calc non Af Amer: >60
Potassium: 3.7
Potassium: 3.7
Total Bilirubin: 0.4
Total Bilirubin: 0.6

## 2011-01-19 LAB — POCT I-STAT 7, (LYTES, BLD GAS, ICA,H+H)
Calcium, Ion: 1.23
Hemoglobin: 9.9 — ABNORMAL LOW
O2 Saturation: 100
Operator id: 238531
Patient temperature: 35.5
Patient temperature: 35.9
Potassium: 3.8
Potassium: 4.2
Sodium: 138
TCO2: 24
pCO2 arterial: 40.9
pH, Arterial: 7.37

## 2011-01-19 LAB — POCT I-STAT 4, (NA,K, GLUC, HGB,HCT)
HCT: 34 — ABNORMAL LOW
Operator id: 3406
Sodium: 136

## 2011-01-19 LAB — NA AND K (SODIUM & POTASSIUM), RAND UR: Potassium Urine: 69

## 2011-01-19 LAB — CBC
HCT: 23.6 — ABNORMAL LOW
HCT: 27.3 — ABNORMAL LOW
HCT: 28.1 — ABNORMAL LOW
HCT: 35.2 — ABNORMAL LOW
Hemoglobin: 9.3 — ABNORMAL LOW
Hemoglobin: 9.6 — ABNORMAL LOW
Hemoglobin: 9.8 — ABNORMAL LOW
MCHC: 34.5
MCV: 82.8
MCV: 83.2
MCV: 84.5
Platelets: 217
Platelets: 244
Platelets: 327
RBC: 2.65 — ABNORMAL LOW
RBC: 3.34 — ABNORMAL LOW
RBC: 3.41 — ABNORMAL LOW
RDW: 12.5
RDW: 13.5
RDW: 13.6
WBC: 10.1
WBC: 10.6 — ABNORMAL HIGH
WBC: 12.8 — ABNORMAL HIGH
WBC: 8.2
WBC: 9.1

## 2011-01-19 LAB — WOUND CULTURE: Culture: NO GROWTH

## 2011-01-23 LAB — CBC
HCT: 35 — ABNORMAL LOW
Hemoglobin: 11.9 — ABNORMAL LOW
MCHC: 34.1
RDW: 13.6

## 2011-01-23 LAB — URINALYSIS, ROUTINE W REFLEX MICROSCOPIC
Glucose, UA: NEGATIVE
Ketones, ur: NEGATIVE
Nitrite: NEGATIVE
Protein, ur: NEGATIVE

## 2011-01-23 LAB — COMPREHENSIVE METABOLIC PANEL
BUN: 7
Calcium: 9.1
Glucose, Bld: 108 — ABNORMAL HIGH
Total Protein: 6.8

## 2011-01-23 LAB — HCG, SERUM, QUALITATIVE: Preg, Serum: NEGATIVE

## 2011-01-23 LAB — APTT: aPTT: 33

## 2011-01-23 LAB — URINE MICROSCOPIC-ADD ON

## 2011-01-23 LAB — PROTIME-INR
INR: 1
Prothrombin Time: 13.2

## 2011-02-27 ENCOUNTER — Encounter: Payer: Self-pay | Admitting: Emergency Medicine

## 2011-02-27 ENCOUNTER — Inpatient Hospital Stay (INDEPENDENT_AMBULATORY_CARE_PROVIDER_SITE_OTHER)
Admission: RE | Admit: 2011-02-27 | Discharge: 2011-02-27 | Disposition: A | Discharge status: Home / Self Care | Payer: BC Managed Care – PPO | Source: Ambulatory Visit | Attending: Emergency Medicine | Admitting: Emergency Medicine

## 2011-02-27 DIAGNOSIS — J069 Acute upper respiratory infection, unspecified: Secondary | ICD-10-CM

## 2011-02-27 DIAGNOSIS — J039 Acute tonsillitis, unspecified: Secondary | ICD-10-CM

## 2011-02-27 DIAGNOSIS — R05 Cough: Secondary | ICD-10-CM

## 2011-03-05 ENCOUNTER — Telehealth (INDEPENDENT_AMBULATORY_CARE_PROVIDER_SITE_OTHER): Payer: Self-pay | Admitting: *Deleted

## 2011-03-09 ENCOUNTER — Encounter: Payer: Self-pay | Admitting: *Deleted

## 2011-03-09 ENCOUNTER — Emergency Department
Admission: EM | Admit: 2011-03-09 | Discharge: 2011-03-09 | Disposition: A | Discharge status: Home / Self Care | Payer: BC Managed Care – PPO | Source: Home / Self Care | Attending: Family Medicine | Admitting: Family Medicine

## 2011-03-09 DIAGNOSIS — J209 Acute bronchitis, unspecified: Secondary | ICD-10-CM

## 2011-03-09 DIAGNOSIS — H6590 Unspecified nonsuppurative otitis media, unspecified ear: Secondary | ICD-10-CM

## 2011-03-09 DIAGNOSIS — J329 Chronic sinusitis, unspecified: Secondary | ICD-10-CM

## 2011-03-09 MED ORDER — FEXOFENADINE-PSEUDOEPHED ER 180-240 MG PO TB24: 1.0000 | ORAL_TABLET | Freq: Every day | ORAL | Status: AC

## 2011-03-09 MED ORDER — LEVOFLOXACIN 750 MG PO TABS: 750.0000 mg | ORAL_TABLET | Freq: Every day | ORAL | Status: AC

## 2011-03-09 MED ORDER — HYDROCOD POLST-CHLORPHEN POLST 10-8 MG/5ML PO LQCR: 5.0000 mL | Freq: Two times a day (BID) | ORAL | Status: DC | PRN

## 2011-03-09 NOTE — ED Notes (Signed)
Pt c/o cough x 12 days. She took dayquil today. She was seen on 02/27/11 given augmentin which she finished 03/06/11 and cheratussin which she is still taking.

## 2011-03-09 NOTE — ED Provider Notes (Signed)
History     CSN: [id number] Arrival date & time: 03/09/2011  4:29 PM   First MD Initiated Contact with Patient 03/09/11 1626      Chief Complaint  Patient presents with  . URI    (Consider location/radiation/quality/duration/timing/severity/associated sxs/prior treatment) Patient is a 35 y.o. female presenting with URI. The history is provided by the patient.  URI The primary symptoms include fatigue, ear pain, swollen glands, cough and wheezing. Primary symptoms do not include fever or nausea. The current episode started more than 1 week ago. This is a recurrent problem. The problem has been gradually worsening.  The ear pain began more than 2 days ago. The ear pain has been gradually worsening since its onset. The left ear is affected. The pain is severe.    The swollen gland was first noticed more than 1 week ago.  The cough began more than 1 week ago. The cough is recurrent. The cough is nocturnal, harsh and barking. The sputum is yellow.  Symptoms associated with the illness include congestion.   Should be noted the patient was seen here about 3 or 4 days at the started she was put on Augmentin 875-8 days injury tests and blood did show some improvement before became worse after she ran out of Augmentin. History reviewed. No pertinent past medical history.  Past Surgical History  Procedure Date  . Soft tissue tumor resection 06/17/07    chest/ wound reclosure 4/09'    Family History  Problem Relation Age of Onset  . Hypertension Mother   . Depression Father     History  Substance Use Topics  . Smoking status: Never Smoker   . Smokeless tobacco: Not on file  . Alcohol Use: No    OB History    Grav Para Term Preterm Abortions TAB SAB Ect Mult Living                  Review of Systems  Constitutional: Positive for fatigue. Negative for fever.  HENT: Positive for ear pain and congestion.   Respiratory: Positive for cough and wheezing.   Gastrointestinal:  Negative for nausea.    Allergies  Review of patient's allergies indicates no known allergies.  Home Medications  No current outpatient prescriptions on file.  BP 133/87  Pulse 84  Temp(Src) 98.3 F (36.8 C) (Oral)  Resp 24  Ht 5\' 4"  (1.626 m)  Wt 266 lb 8 oz (120.884 kg)  BMI 45.74 kg/m2  SpO2 100%  LMP 03/01/2011  Physical Exam  Nursing note and vitals reviewed. Constitutional: She is oriented to person, place, and time. She appears well-developed and well-nourished. No distress.  HENT:  Head: Normocephalic and atraumatic.  Right Ear: Tympanic membrane, external ear and ear canal normal.  Left Ear: Ear canal normal. Tympanic membrane is injected. A middle ear effusion is present.  Nose: Mucosal edema and rhinorrhea present. Right sinus exhibits maxillary sinus tenderness. Left sinus exhibits maxillary sinus tenderness.  Mouth/Throat: Oropharynx is clear and moist. No oral lesions. No oropharyngeal exudate.  Eyes: Right eye exhibits no discharge. Left eye exhibits no discharge. No scleral icterus.  Neck: Neck supple.  Cardiovascular: Normal rate, regular rhythm and normal heart sounds.   Pulmonary/Chest: Effort normal and breath sounds normal. She has no wheezes. She has no rales.       Actively coughing  Lymphadenopathy:    She has no cervical adenopathy.  Neurological: She is alert and oriented to person, place, and time.  Skin: Skin is  warm and dry.    ED Course  Procedures (including critical care time)  Bronchitis  Sinusitis L otitis media     MDM          Hassan Rowan, MD 03/09/11 1658

## 2011-04-02 NOTE — Telephone Encounter (Signed)
  Phone Note Call from Patient   Caller: Patient Summary of Call: Received a message from patient left on thursday 03/01/11. Patient was not feeling any better after 2 days. I called thepatient back and left a message asking her to call back if she is still no better after taking the antibiotic for 6 days.

## 2011-04-02 NOTE — Progress Notes (Signed)
Summary: URI   Vital Signs:  Patient Profile:   35 Years Old Female CC:      productive cough, sore throat, fever, ear pain Height:     64 inches Weight:      262 pounds O2 Sat:      97 % O2 treatment:    Room Air Temp:     98.4 degrees F oral Pulse rate:   82 / minute Resp:     16 per minute BP sitting:   116 / 78  (left arm) Cuff size:   large  Vitals Entered By: Lajean Saver RN (February 27, 2011 9:39 AM)                  Updated Prior Medication List: No Medications Current Allergies: No known allergies History of Present Illness Chief Complaint: productive cough, sore throat, fever, ear pain History of Present Illness: 35 Years Old Female complains of onset of cold symptoms for 5-6 days.  Kallen has been using OTC meds which is helping a little bit. + sore throat + cough No pleuritic pain No wheezing + nasal congestion + post-nasal drainage + sinus pain/pressure No chest congestion No itchy/red eyes No earache No hemoptysis No SOB + chills/sweats + fever No nausea No vomiting No abdominal pain No diarrhea No skin rashes No fatigue No myalgias No headache   REVIEW OF SYSTEMS Constitutional Symptoms       Complains of fever and chills.     Denies night sweats, weight loss, weight gain, and fatigue.  Eyes       Denies change in vision, eye pain, eye discharge, glasses, contact lenses, and eye surgery. Ear/Nose/Throat/Mouth       Complains of ear pain, sinus problems, and sore throat.      Denies hearing loss/aids, change in hearing, ear discharge, dizziness, frequent runny nose, frequent nose bleeds, hoarseness, and tooth pain or bleeding.  Respiratory       Complains of productive cough.      Denies dry cough, wheezing, shortness of breath, asthma, bronchitis, and emphysema/COPD.  Cardiovascular       Denies murmurs, chest pain, and tires easily with exhertion.    Gastrointestinal       Denies stomach pain, nausea/vomiting, diarrhea, constipation,  blood in bowel movements, and indigestion. Genitourniary       Denies painful urination, blood or discharge from vagina, kidney stones, and loss of urinary control. Neurological       Denies paralysis, seizures, and fainting/blackouts. Musculoskeletal       Denies muscle pain, joint pain, joint stiffness, decreased range of motion, redness, swelling, muscle weakness, and gout.  Skin       Denies bruising, unusual mles/lumps or sores, and hair/skin or nail changes.  Psych       Denies mood changes, temper/anger issues, anxiety/stress, speech problems, depression, and sleep problems.  Past History:  Past Medical History: Reviewed history from 02/02/2010 and no changes required. Unremarkable  Past Surgical History: Reviewed history from 02/02/2010 and no changes required. Removal of cystic teratoma in chest D&C  Family History: Reviewed history from 04/02/2010 and no changes required. Mother, Anemia Father, D, MVA  Social History: Reviewed history from 02/02/2010 and no changes required. Married Never Smoked Alcohol use-no Drug use-no Regular exercise-no Physical Exam General appearance: well developed, well nourished, no acute distress Ears: normal, no lesions or deformities Nasal: mucosa pink, nonedematous, no septal deviation, turbinates normal Oral/Pharynx: pharyngeal erythema without exudate, uvula midline without deviation,  tonsils 1-2+ but OP still patent Neck: Tender L ant cerv LAD Chest/Lungs: no rales, wheezes, or rhonchi bilateral, breath sounds equal without effort Heart: regular rate and  rhythm, no murmur MSE: oriented to time, place, and person Assessment New Problems: COUGH (ICD-786.2) UPPER RESPIRATORY INFECTION, ACUTE (ICD-465.9) ACUTE TONSILLITIS (ICD-463)   Plan New Medications/Changes: CHERATUSSIN AC 100-10 MG/5ML SYRP (GUAIFENESIN-CODEINE) 5cc q6 hrs as needed for cough  #5oz x 0, 02/27/2011, Hoyt Koch MD AUGMENTIN [phone] MG TABS  (AMOXICILLIN-POT CLAVULANATE) 1 by mouth two times a day for 8 days  #16 x 6, 02/27/2011, Hoyt Koch MD  New Orders: Est. Patient Level III [04540] Pulse Oximetry (single measurment) [94760] Rapid Strep [98119] T-Culture, Throat [[id number]] Planning Comments:   1)  Take the prescribed antibiotic as instructed. 2)  Use nasal saline solution (over the counter) at least 3 times a day. 3)  Use over the counter decongestants like Zyrtec-D every 12 hours as needed to help with congestion. 4)  Can take tylenol every 6 hours or motrin every 8 hours for pain or fever. 5)  Follow up with your primary doctor  if no improvement in 5-7 days, sooner if increasing pain, fever, or new symptoms.    The patient and/or caregiver has been counseled thoroughly with regard to medications prescribed including dosage, schedule, interactions, rationale for use, and possible side effects and they verbalize understanding.  Diagnoses and expected course of recovery discussed and will return if not improved as expected or if the condition worsens. Patient and/or caregiver verbalized understanding.  Prescriptions: CHERATUSSIN AC 100-10 MG/5ML SYRP (GUAIFENESIN-CODEINE) 5cc q6 hrs as needed for cough  #5oz x 0   Entered and Authorized by:   Hoyt Koch MD   Signed by:   Hoyt Koch MD on 02/27/2011   Method used:   Print then Give to Patient   RxID:   [id number] AUGMENTIN 875-125 MG TABS (AMOXICILLIN-POT CLAVULANATE) 1 by mouth two times a day for 8 days  #16 x 6   Entered and Authorized by:   Hoyt Koch MD   Signed by:   Hoyt Koch MD on 02/27/2011   Method used:   Print then Give to Patient   RxID:   [id number]   Orders Added: 1)  Est. Patient Level III [44034] 2)  Pulse Oximetry (single measurment) [94760] 3)  Rapid Strep [74259] 4)  T-Culture, Throat [[id number]]    Laboratory Results  Date/Time Received: February 27, 2011 9:41 AM  Date/Time Reported:  February 27, 2011 9:41 AM   Other Tests  Rapid Strep: negative  Kit Test Internal QC: Negative   (Normal Range: Negative)

## 2011-06-11 ENCOUNTER — Encounter: Payer: Self-pay | Admitting: *Deleted

## 2011-06-11 ENCOUNTER — Emergency Department
Admission: EM | Admit: 2011-06-11 | Discharge: 2011-06-11 | Disposition: A | Discharge status: Home / Self Care | Payer: BC Managed Care – PPO | Source: Home / Self Care | Attending: Family Medicine | Admitting: Family Medicine

## 2011-06-11 DIAGNOSIS — J069 Acute upper respiratory infection, unspecified: Secondary | ICD-10-CM

## 2011-06-11 DIAGNOSIS — M940 Chondrocostal junction syndrome [Tietze]: Secondary | ICD-10-CM

## 2011-06-11 MED ORDER — HYDROCOD POLST-CHLORPHEN POLST 10-8 MG/5ML PO LQCR: 5.0000 mL | Freq: Every evening | ORAL | Status: DC | PRN

## 2011-06-11 MED ORDER — AZITHROMYCIN 250 MG PO TABS: ORAL_TABLET | ORAL | Status: AC

## 2011-06-11 NOTE — ED Notes (Signed)
Patient c/o dry cough, congestion, and left ear pain x 6 days. Deniers fever. Taken nyquil OTC.

## 2011-06-11 NOTE — ED Provider Notes (Addendum)
History     CSN: [id number]  Arrival date & time 06/11/11  4098   First MD Initiated Contact with Patient 06/11/11 [phone]      Chief Complaint  Patient presents with  . URI     HPI Comments: Patient complains of approximately 6 day history of gradually progressive URI symptoms beginning with a mild sore throat (now improved), followed by progressive nasal congestion.  A cough started about 4 days ago.  Complains of fatigue but no myalgias.  Cough is now worse at night and generally non-productive during the day.  There has been no pleuritic pain, shortness of breath, or wheezes.  However, she complains of pain in her sternum at site of surgical scar.  The history is provided by the patient.    History reviewed. No pertinent past medical history.  Past Surgical History  Procedure Date  . Soft tissue tumor resection 06/17/07    chest/ wound reclosure 4/09'  . Dilation and curettage, diagnostic / therapeutic     Family History  Problem Relation Age of Onset  . Hypertension Mother   . Depression Father     History  Substance Use Topics  . Smoking status: Never Smoker   . Smokeless tobacco: Not on file  . Alcohol Use: No    OB History    Grav Para Term Preterm Abortions TAB SAB Ect Mult Living                  Review of Systems + sore throat resolved + cough No pleuritic pain but has soreness over sternum No wheezing + nasal congestion ? post-nasal drainage No sinus pain/pressure No itchy/red eyes No earache but left ear feels clogged No hemoptysis No SOB No fever, + chills No nausea No vomiting No abdominal pain No diarrhea No urinary symptoms No skin rashes + fatigue No myalgias No headache Used OTC meds without relief  Allergies  Review of patient's allergies indicates no known allergies.  Home Medications   Current Outpatient Rx  Name Route Sig Dispense Refill  . AZITHROMYCIN 250 MG PO TABS  Take 2 tabs today; then begin one tab once daily for  4 more days (Rx void after 06/19/11) 6 each 0  . HYDROCOD POLST-CPM POLST ER 10-8 MG/5ML PO LQCR Oral Take 5 mLs by mouth at bedtime as needed. 115 mL 0  . FEXOFENADINE-PSEUDOEPHED ER 180-240 MG PO TB24 Oral Take 1 tablet by mouth daily. 30 tablet 1    BP 110/64  Pulse 88  Temp(Src) 98.2 F (36.8 C) (Oral)  Resp 16  Ht 5\' 2"  (1.575 m)  Wt 262 lb (118.842 kg)  BMI 47.92 kg/m2  SpO2 98%  Physical Exam Nursing notes and Vital Signs reviewed. Appearance:  Patient appears obese, but otherwise healthy, stated age, and in no acute distress Eyes:  Pupils are equal, round, and reactive to light and accomodation.  Extraocular movement is intact.  Conjunctivae are not inflamed  Ears:  Canals normal.  Tympanic membranes normal.  Nose:  Mildly congested turbinates.  No sinus tenderness.   Pharynx:  Normal Neck:  Supple.   Tender shotty anterior/posterior nodes are palpated bilaterally  Lungs:  Clear to auscultation.  Breath sounds are equal.  Chest:  Distinct tenderness to palpation over the mid-sternum.  Heart:  Regular rate and rhythm without murmurs, rubs, or gallops.  Abdomen:  Nontender without masses or hepatosplenomegaly.  Bowel sounds are present.  No CVA or flank tenderness.  Extremities:  No edema.  No calf tenderness Skin:  No rash present.   ED Course  Procedures none      1. Acute upper respiratory infections of unspecified site   2. Costochondritis, acute       MDM  There is no evidence of bacterial infection today.   Treat symptomatically for now: Take Mucinex D (guaifenesin with decongestant) twice daily for congestion.  Increase fluid intake, rest. May use Afrin nasal spray (or generic oxymetazoline) twice daily for about 5 days.  Also recommend using saline nasal spray several times daily and saline nasal irrigation (AYR is a common brand) Stop all antihistamines for now, and other non-prescription cough/cold preparations. May take Ibuprofen 200mg , 4 tabs every 8  hours with food for chest/sternum discomfort. Begin Azithromycin if not improving about 5 days or if persistent fever develops (given Rx to hold) Follow-up with family doctor if not improving 7 to 10 days.        Donna Christen, MD 06/11/11 1019  Addendum:  Rx for Tussionex at bedtime.  Donna Christen, MD 06/11/11 1019

## 2011-09-06 ENCOUNTER — Other Ambulatory Visit: Payer: Self-pay | Admitting: Thoracic Surgery

## 2011-09-06 DIAGNOSIS — D150 Benign neoplasm of thymus: Secondary | ICD-10-CM

## 2011-10-10 ENCOUNTER — Ambulatory Visit: Payer: BC Managed Care – PPO | Admitting: Thoracic Surgery

## 2011-10-10 ENCOUNTER — Ambulatory Visit (INDEPENDENT_AMBULATORY_CARE_PROVIDER_SITE_OTHER): Payer: BC Managed Care – PPO | Admitting: Thoracic Surgery

## 2011-10-10 ENCOUNTER — Ambulatory Visit
Admission: RE | Admit: 2011-10-10 | Discharge: 2011-10-10 | Disposition: A | Discharge status: Home / Self Care | Payer: BC Managed Care – PPO | Source: Ambulatory Visit | Attending: Thoracic Surgery | Admitting: Thoracic Surgery

## 2011-10-10 ENCOUNTER — Encounter: Payer: Self-pay | Admitting: Thoracic Surgery

## 2011-10-10 VITALS — BP 148/96 | HR 82 | Resp 18 | Ht 62.0 in | Wt 270.0 lb

## 2011-10-10 DIAGNOSIS — J9859 Other diseases of mediastinum, not elsewhere classified: Secondary | ICD-10-CM

## 2011-10-10 DIAGNOSIS — D150 Benign neoplasm of thymus: Secondary | ICD-10-CM

## 2011-10-10 DIAGNOSIS — R222 Localized swelling, mass and lump, trunk: Secondary | ICD-10-CM

## 2011-10-10 NOTE — Progress Notes (Signed)
HPI CT scan today showed no evidence for recurrence of her cystic malignant teratoma. She is now 4 years since resection. She is doing well overall. Incisions are well-healed. We will follow by her primary care physician. Current Outpatient Prescriptions  Medication Sig Dispense Refill  . chlorpheniramine-HYDROcodone (TUSSIONEX PENNKINETIC ER) 10-8 MG/5ML LQCR Take 5 mLs by mouth at bedtime as needed.  115 mL  0  . fexofenadine-pseudoephedrine (ALLEGRA-D 24 HOUR) 180-240 MG per 24 hr tablet Take 1 tablet by mouth daily.  30 tablet  1     Review of Systems: Unchanged  Physical Exam lungs are clear to auscultation percussion   Diagnostic Tests: CT scan showed no evidence of recurrence   Impression: Status post resection of teratoma   Plan: Return as needed

## 2012-01-15 ENCOUNTER — Encounter: Payer: Self-pay | Admitting: *Deleted

## 2012-01-15 ENCOUNTER — Emergency Department
Admission: EM | Admit: 2012-01-15 | Discharge: 2012-01-15 | Disposition: A | Discharge status: Home / Self Care | Payer: BC Managed Care – PPO | Source: Home / Self Care

## 2012-01-15 ENCOUNTER — Telehealth: Payer: Self-pay | Admitting: *Deleted

## 2012-01-15 ENCOUNTER — Ambulatory Visit (HOSPITAL_BASED_OUTPATIENT_CLINIC_OR_DEPARTMENT_OTHER)
Admission: RE | Admit: 2012-01-15 | Discharge: 2012-01-15 | Disposition: A | Discharge status: Home / Self Care | Payer: BC Managed Care – PPO | Source: Ambulatory Visit | Attending: Family Medicine | Admitting: Family Medicine

## 2012-01-15 ENCOUNTER — Other Ambulatory Visit: Payer: Self-pay | Admitting: Family Medicine

## 2012-01-15 DIAGNOSIS — R51 Headache: Secondary | ICD-10-CM

## 2012-01-15 DIAGNOSIS — Z808 Family history of malignant neoplasm of other organs or systems: Secondary | ICD-10-CM | POA: Insufficient documentation

## 2012-01-15 MED ORDER — PROMETHAZINE HCL 25 MG/ML IJ SOLN: 25.0000 mg | Freq: Four times a day (QID) | INTRAMUSCULAR | Status: DC | PRN

## 2012-01-15 MED ORDER — KETOROLAC TROMETHAMINE 60 MG/2ML IM SOLN
60.0000 mg | Freq: Once | INTRAMUSCULAR | Status: AC
  Administered 2012-01-15: 60 mg via INTRAMUSCULAR

## 2012-01-15 MED ORDER — PROMETHAZINE HCL 25 MG/ML IJ SOLN
25.0000 mg | Freq: Once | INTRAMUSCULAR | Status: AC
  Administered 2012-01-15: 25 mg via INTRAMUSCULAR

## 2012-01-15 MED ORDER — IOHEXOL 300 MG/ML  SOLN
80.0000 mL | Freq: Once | INTRAMUSCULAR | Status: AC | PRN
  Administered 2012-01-15: 80 mL via INTRAVENOUS

## 2012-01-15 MED ORDER — SUMATRIPTAN SUCCINATE 25 MG PO TABS: 25.0000 mg | ORAL_TABLET | ORAL | Status: DC | PRN

## 2012-01-15 MED ORDER — PROMETHAZINE HCL 25 MG PO TABS: 25.0000 mg | ORAL_TABLET | Freq: Four times a day (QID) | ORAL | Status: DC | PRN

## 2012-01-15 NOTE — ED Notes (Signed)
Patient reports HA started post panic attack 2 days ago. Taken IBF without much relief. No HX of migraines, c/o nausea and vomiting.

## 2012-01-15 NOTE — ED Provider Notes (Addendum)
History     CSN: [id number]  Arrival date & time 01/15/12  0940   First MD Initiated Contact with Patient 01/15/12 [phone]      Chief Complaint  Patient presents with  . Headache   Patient is a 36 y.o. female presenting with headaches.  Headache The primary symptoms include headaches (headache is described as primarily unilateral; on R side. Throbbing in nature with eye pain. Some eye tearing. + photophobia and nausea. ). Episode onset: 2-3 days ago  The symptoms are unchanged. The neurological symptoms are focal. Context: following severe anxiety attack   The headache is associated with photophobia.  Additional symptoms include photophobia. Associated symptoms comments: Nausea  . 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 Grand mother also currently with brain cancer ). Medical issues do not include seizures. Associated medical issues comments: Pt also reports + family hx/o migraine in mother who had similar onset of migraine in 30s with similar headache pattern. . Workup history does not include CT scan.  Pt denies any prior history of migraine prior to this.    History reviewed. No pertinent past medical history.  Past Surgical History  Procedure Date  . Soft tissue tumor resection 06/17/07    chest/ wound reclosure 4/09'  . Dilation and curettage, diagnostic / therapeutic     Family History  Problem Relation Age of Onset  . Hypertension Mother   . Migraines Mother   . Depression Father     History  Substance Use Topics  . Smoking status: Never Smoker   . Smokeless tobacco: Not on file  . Alcohol Use: No    OB History    Grav Para Term Preterm Abortions TAB SAB Ect Mult Living                  Review of Systems  Eyes: Positive for photophobia.  Neurological: Positive for headaches (headache is described as primarily unilateral; on R side. Throbbing in nature with eye pain. Some eye tearing. + photophobia and nausea.  ).  All other systems reviewed and are negative.    Allergies  Review of patient's allergies indicates no known allergies.  Home Medications   Current Outpatient Rx  Name Route Sig Dispense Refill  . HYDROCOD POLST-CPM POLST ER 10-8 MG/5ML PO LQCR Oral Take 5 mLs by mouth at bedtime as needed. 115 mL 0  . FEXOFENADINE-PSEUDOEPHED ER 180-240 MG PO TB24 Oral Take 1 tablet by mouth daily. 30 tablet 1    BP 134/91  Pulse 91  Resp 16  Ht 5\' 2"  (1.575 m)  Wt 266 lb (120.657 kg)  BMI 48.65 kg/m2  SpO2 100%  LMP 12/17/2011  Physical Exam  Constitutional: She is oriented to person, place, and time.       Obese    HENT:  Head: Normocephalic and atraumatic.  Right Ear: External ear normal.  Left Ear: External ear normal.  Mouth/Throat: Oropharynx is clear and moist.  Eyes: Pupils are equal, round, and reactive to light. Right eye exhibits no discharge.       Mild nystagmus bilaterally. PERRL  + photophobia with R eye funduscopic examination.  No papilledema noted on exam.    Neck: Normal range of motion. Neck supple.  Cardiovascular: Normal rate, regular rhythm and normal heart sounds.   Pulmonary/Chest: Effort normal and breath sounds normal.  Abdominal: Soft.  Musculoskeletal: Normal range of motion.  Lymphadenopathy:    She has cervical adenopathy.  Neurological:  She is alert and oriented to person, place, and time. She displays normal reflexes. No cranial nerve deficit (mild nystagmus; otherwise negative ). She exhibits normal muscle tone. Coordination normal.  Skin: Skin is warm. No erythema.    ED Course  Procedures (including critical care time)  Labs Reviewed - No data to display No results found.   1. Headache       MDM  Suspect this is new onset migraine/cluster headache variant given sxs and family history.  However, given medical history ( prior hx/o mediastinal mass s/p resection and family hx/o brain ca) as well as this being a new onset headache,  will send pt for imaging to r/o intracranial mass causing sxs.  Pt given toradol 60mg  and phenergan 25mg  IM x1 for symptomatic treatment.  Rx for po phenergan called in.  Will place on trial of imitrex pending imaging.  Discussed neuro and general red flags at [facility].  Follow up with PCP in 1-2 weeks.      The patient and/or caregiver has been counseled thoroughly with regard to treatment plan and/or medications prescribed including dosage, schedule, interactions, rationale for use, and possible side effects and they verbalize understanding. Diagnoses and expected course of recovery discussed and will return if not improved as expected or if the condition worsens. Patient and/or caregiver verbalized understanding.             Doree Albee, MD 01/15/12 1036  Update: head CT negative for intracranial mass. Pt notified. Imitrex called in.    Ct Head W Wo Contrast  01/15/2012  *RADIOLOGY REPORT*  Clinical Data: New onset headache, history of mediastinal cystic teratoma, familial history of brain cancer, evaluate for mass  CT HEAD WITHOUT AND WITH CONTRAST  Technique:  Contiguous axial images were obtained from the base of the skull through the vertex without and with intravenous contrast.  Contrast: 80mL OMNIPAQUE IOHEXOL 300 MG/ML  SOLN  Comparison: None.  Findings:  Wallace Cullens white differentiation is maintained.  No CT evidence of acute large territory for infarct.  No enhancing intraparenchymal or extra-axial mass.  No intraparenchymal or extra-axial hemorrhage. Normal size and configuration of the ventricles and basilar cisterns.  No midline shift.  Limited visualization of the paranasal sinuses and mastoid air cells are normal.  Regional soft tissues are normal.  No displaced calvarial fracture.  IMPRESSION:  Negative pre and post contrast head CT.  Specifically, no evidence of enhancing intracranial mass.  Further evaluation with brain MRI may be performed as clinically indicated.    Original Report Authenticated By: Waynard Reeds, M.D.      Doree Albee, MD 01/15/12 1213

## 2012-03-10 ENCOUNTER — Ambulatory Visit (INDEPENDENT_AMBULATORY_CARE_PROVIDER_SITE_OTHER): Payer: BC Managed Care – PPO | Admitting: Sports Medicine

## 2012-03-10 ENCOUNTER — Encounter: Payer: Self-pay | Admitting: Sports Medicine

## 2012-03-10 VITALS — BP 120/86 | HR 86 | Temp 98.4°F | Resp 16 | Ht 62.0 in | Wt 269.0 lb

## 2012-03-10 DIAGNOSIS — G43909 Migraine, unspecified, not intractable, without status migrainosus: Secondary | ICD-10-CM

## 2012-03-10 DIAGNOSIS — D499 Neoplasm of unspecified behavior of unspecified site: Secondary | ICD-10-CM

## 2012-03-10 DIAGNOSIS — E669 Obesity, unspecified: Secondary | ICD-10-CM

## 2012-03-10 DIAGNOSIS — Z299 Encounter for prophylactic measures, unspecified: Secondary | ICD-10-CM

## 2012-03-10 DIAGNOSIS — Z0000 Encounter for general adult medical examination without abnormal findings: Secondary | ICD-10-CM | POA: Insufficient documentation

## 2012-03-10 LAB — COMPREHENSIVE METABOLIC PANEL WITH GFR
AST: 11 U/L (ref 0–37)
Albumin: 3.8 g/dL (ref 3.5–5.2)
Alkaline Phosphatase: 79 U/L (ref 39–117)
BUN: 10 mg/dL (ref 6–23)
Glucose, Bld: 84 mg/dL (ref 70–99)
Potassium: 4.2 meq/L (ref 3.5–5.3)
Sodium: 137 meq/L (ref 135–145)
Total Bilirubin: 0.3 mg/dL (ref 0.3–1.2)

## 2012-03-10 LAB — COMPREHENSIVE METABOLIC PANEL
ALT: 12 U/L (ref 0–35)
CO2: 23 mEq/L (ref 19–32)
Calcium: 8.6 mg/dL (ref 8.4–10.5)
Chloride: 105 mEq/L (ref 96–112)
Creat: 0.56 mg/dL (ref 0.50–1.10)
Total Protein: 6.5 g/dL (ref 6.0–8.3)

## 2012-03-10 LAB — CBC
HCT: 36.9 % (ref 36.0–46.0)
Hemoglobin: 13.2 g/dL (ref 12.0–15.0)
MCH: 28.8 pg (ref 26.0–34.0)
MCHC: 35.8 g/dL (ref 30.0–36.0)
MCV: 80.4 fL (ref 78.0–100.0)
Platelets: 296 10*3/uL (ref 150–400)
RBC: 4.59 MIL/uL (ref 3.87–5.11)
RDW: 12.5 % (ref 11.5–15.5)
WBC: 7.6 10*3/uL (ref 4.0–10.5)

## 2012-03-10 LAB — LIPID PANEL
Cholesterol: 189 mg/dL (ref 0–200)
HDL: 39 mg/dL — ABNORMAL LOW (ref >39)
LDL Cholesterol: 129 mg/dL — ABNORMAL HIGH (ref 0–99)
Total CHOL/HDL Ratio: 4.8 Ratio
Triglycerides: 105 mg/dL (ref <150)
VLDL: 21 mg/dL (ref 0–40)

## 2012-03-10 LAB — TSH: TSH: 2.22 u[IU]/mL (ref 0.350–4.500)

## 2012-03-10 LAB — HEMOGLOBIN A1C
Hgb A1c MFr Bld: 5.1 % (ref <5.7)
Mean Plasma Glucose: 100 mg/dL (ref <117)

## 2012-03-10 MED ORDER — SUMATRIPTAN SUCCINATE 25 MG PO TABS
25.0000 mg | ORAL_TABLET | ORAL | Status: DC | PRN
Start: 2012-03-10 — End: 2013-02-13

## 2012-03-10 NOTE — Progress Notes (Signed)
Subjective:    CC: Establish care.   HPI:  History of teratoma: Status post excision with median sternotomy. Needs chest x-rays yearly.  Headaches: As a throbbing right parietal headache no more often than once per month, associated with photophobia, phonophobia, and nausea. Imitrex in the past has worked well, she does not desire to be preventive treatment. Pain is localized, does not radiate. She did have a CT head when this first occurred with her history of teratoma, this was negative  Preventive care: Up-to-date on Tdap, flu, Pap smear.  Past medical history, Surgical history, Family history, Social history, Allergies, and medications have been entered into the medical record, reviewed, and no changes needed.   Review of Systems: No headache, visual changes, nausea, vomiting, diarrhea, constipation, dizziness, abdominal pain, skin rash, fevers, chills, night sweats, swollen lymph nodes, weight loss, chest pain, body aches, joint swelling, muscle aches, or shortness of breath.   Objective:    General: Well Developed, well nourished, and in no acute distress.  Neuro: Alert and oriented x3, extra-ocular muscles intact.  HEENT: Normocephalic, atraumatic, pupils equal round reactive to light, neck supple, no masses, no lymphadenopathy, thyroid nonpalpable. Oropharynx unremarkable with the exception of enlarged tonsils. Skin: Warm and dry, no rashes noted.  Cardiac: Regular rate and rhythm, no murmurs rubs or gallops.  Respiratory: Clear to auscultation bilaterally. Not using accessory muscles, speaking in full sentences.  Abdominal: Soft, nontender, nondistended, positive bowel sounds, no masses, no organomegaly.  Musculoskeletal: Shoulder, elbow, wrist, hip, knee, ankle stable, and with full range of motion.  Impression and Recommendations:    The patient was counselled, risk factors were discussed, anticipatory guidance given.

## 2012-03-10 NOTE — Assessment & Plan Note (Signed)
Checking a TSH. At future visits we work on weight loss strategies.

## 2012-03-10 NOTE — Assessment & Plan Note (Signed)
Up to date on all preventive care. Checking lipids, TSH, CMET, CBC, A1c.

## 2012-03-10 NOTE — Assessment & Plan Note (Signed)
Headaches present at the most frequently q. monthly. I think that Imitrex when necessary is a good option for her.

## 2012-03-10 NOTE — Assessment & Plan Note (Signed)
Had a CT scan earlier this year, will x-ray next year

## 2012-03-11 ENCOUNTER — Encounter: Payer: Self-pay | Admitting: Sports Medicine

## 2012-03-11 DIAGNOSIS — E785 Hyperlipidemia, unspecified: Secondary | ICD-10-CM | POA: Insufficient documentation

## 2012-03-12 ENCOUNTER — Telehealth: Payer: Self-pay | Admitting: *Deleted

## 2012-03-12 ENCOUNTER — Encounter: Payer: Self-pay | Admitting: *Deleted

## 2012-03-12 MED ORDER — ATORVASTATIN CALCIUM 40 MG PO TABS: 40.0000 mg | ORAL_TABLET | Freq: Every day | ORAL | Status: DC

## 2012-03-12 NOTE — Telephone Encounter (Signed)
I looked at her medicine closet, we do not have any samples of the cholesterol medicine that she needs to be on unfortunately. I will be happy to send in some Lipitor. She should not worry about the holidays, 2 days of binge eating should not make a huge impact on her lipid levels

## 2012-03-12 NOTE — Telephone Encounter (Signed)
Pt states she would like samples if you have any of what you want her to take. If not, then she states to send an rx to the pharmacy. She is concerned that with the holidays that she will not have her cholesterol under control in 6 weeks. Please advise.

## 2012-04-08 ENCOUNTER — Ambulatory Visit (INDEPENDENT_AMBULATORY_CARE_PROVIDER_SITE_OTHER): Payer: BC Managed Care – PPO | Admitting: Sports Medicine

## 2012-04-08 ENCOUNTER — Encounter: Payer: Self-pay | Admitting: Sports Medicine

## 2012-04-08 VITALS — BP 139/98 | HR 80 | Temp 98.0°F | Wt 270.0 lb

## 2012-04-08 DIAGNOSIS — J069 Acute upper respiratory infection, unspecified: Secondary | ICD-10-CM | POA: Insufficient documentation

## 2012-04-08 LAB — POCT INFLUENZA A/B: Influenza A, POC: NEGATIVE

## 2012-04-08 MED ORDER — HYDROCOD POLST-CHLORPHEN POLST 10-8 MG/5ML PO LQCR: 5.0000 mL | Freq: Two times a day (BID) | ORAL | Status: DC | PRN

## 2012-04-08 NOTE — Progress Notes (Signed)
Subjective:    CC: Cough  HPI: Husband comes in to see me for a one-day history of subjective fevers, cough not productive, body aches, chills. She denies any nasal drainage, ear pain, sinus pressure, GI symptoms. Cough is keeping her up at night. She has 3 daughters, one of which was diagnosed with a positive flu test, the other 2 have fevers that are actually recorded, as well as body aches an influenza-like illness symptoms. The first daughter has been already treated with Tamiflu. She is wondering if the other 2 can be treated.  Past medical history, Surgical history, Family history, Social history, Allergies, and medications have been entered into the medical record, reviewed, and no changes needed.   Review of Systems: No fevers, chills, night sweats, weight loss, chest pain, or shortness of breath.   Objective:    General: Well Developed, well nourished, and in no acute distress.  Neuro: Alert and oriented x3, extra-ocular muscles intact.  HEENT: Normocephalic, atraumatic, pupils equal round reactive to light, neck supple, no masses, shotty lymphadenopathy, thyroid nonpalpable. External ear canals, oropharynx, nasopharynx unremarkable. Skin: Warm and dry, no rashes. Cardiac: Regular rate and rhythm, no murmurs rubs or gallops.  Respiratory: Clear to auscultation bilaterally. Not using accessory muscles, speaking in full sentences.  Rapid flu test is negative.  Impression and Recommendations:

## 2012-04-08 NOTE — Assessment & Plan Note (Addendum)
Tussionex, over-the-counter cold medication. Flu test negative. Return to clinic as needed.  Of note, her daughters are febrile, and one has tested positive for influenza. I'm treating her other daughters Wynona Canes and Cataleia Gade) with Tamiflu.

## 2012-05-23 ENCOUNTER — Ambulatory Visit (INDEPENDENT_AMBULATORY_CARE_PROVIDER_SITE_OTHER): Payer: BC Managed Care – PPO | Admitting: Physician Assistant

## 2012-05-23 VITALS — BP 148/81 | HR 76 | Wt 271.0 lb

## 2012-05-23 DIAGNOSIS — R062 Wheezing: Secondary | ICD-10-CM

## 2012-05-23 DIAGNOSIS — R0602 Shortness of breath: Secondary | ICD-10-CM

## 2012-05-23 DIAGNOSIS — R059 Cough, unspecified: Secondary | ICD-10-CM

## 2012-05-23 DIAGNOSIS — R05 Cough: Secondary | ICD-10-CM

## 2012-05-23 DIAGNOSIS — J069 Acute upper respiratory infection, unspecified: Secondary | ICD-10-CM

## 2012-05-23 MED ORDER — PREDNISONE 50 MG PO TABS: ORAL_TABLET | ORAL | Status: DC

## 2012-05-23 MED ORDER — HYDROCOD POLST-CHLORPHEN POLST 10-8 MG/5ML PO LQCR: 5.0000 mL | Freq: Two times a day (BID) | ORAL | Status: DC | PRN

## 2012-05-23 MED ORDER — ALBUTEROL SULFATE HFA 108 (90 BASE) MCG/ACT IN AERS: 2.0000 | INHALATION_SPRAY | Freq: Four times a day (QID) | RESPIRATORY_TRACT | Status: DC | PRN

## 2012-05-23 NOTE — Patient Instructions (Addendum)
Will get Chest X-ray on Monday if not improving.   Cough- honey lozengers/delsym/Umcka cold care   Upper Respiratory Infection, Adult An upper respiratory infection (URI) is also known as the common cold. It is often caused by a type of germ (virus). Colds are easily spread (contagious). You can pass it to others by kissing, coughing, sneezing, or drinking out of the same glass. Usually, you get better in 1 or 2 weeks.  HOME CARE   Only take medicine as told by your doctor.  Use a warm mist humidifier or breathe in steam from a hot shower.  Drink enough water and fluids to keep your pee (urine) clear or pale yellow.  Get plenty of rest.  Return to work when your temperature is back to normal or as told by your doctor. You may use a face mask and wash your hands to stop your cold from spreading. GET HELP RIGHT AWAY IF:   After the first few days, you feel you are getting worse.  You have questions about your medicine.  You have chills, shortness of breath, or brown or red spit (mucus).  You have yellow or brown snot (nasal discharge) or pain in the face, especially when you bend forward.  You have a fever, puffy (swollen) neck, pain when you swallow, or white spots in the back of your throat.  You have a bad headache, ear pain, sinus pain, or chest pain.  You have a high-pitched whistling sound when you breathe in and out (wheezing).  You have a lasting cough or cough up blood.  You have sore muscles or a stiff neck. MAKE SURE YOU:   Understand these instructions.  Will watch your condition.  Will get help right away if you are not doing well or get worse. Document Released: 10/03/2007 Document Revised: 07/09/2011 Document Reviewed: 08/21/2010 [facility] Patient Information 2013 [place], [place].

## 2012-05-23 NOTE — Progress Notes (Signed)
  Subjective:    Patient ID: Regina [name], female    DOB: [birth year], 37 y.o.   MRN: [id number]  HPI Patient comes into clinic today with cough for going on 6 days. There is no production but chest fills tight. Denies any fever, chills, muscle aches, ear pain, or ST. She does have wheezing more at night and feels very SOB. Nyquil works about 4 hours at night but she wakes up in the middle of the night coughing. 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 No history of asthma.    Review of Systems     Objective:   Physical Exam  Constitutional: She is oriented to person, place, and time. She appears well-developed.  HENT:  Head: Normocephalic and atraumatic.  Right Ear: External ear normal.  Left Ear: External ear normal.  Nose: Nose normal.  Mouth/Throat: Oropharynx is clear and moist.       TM's clear bilaterally.  Negative for maxillary and frontal tenderness.   Neck: Normal range of motion. Neck supple.  Cardiovascular: Normal rate, regular rhythm and normal heart sounds.   Pulmonary/Chest: Effort normal.       Wheezing heard bilateral lungs. Able to talk in full sentences without being out of breath.   Lymphadenopathy:    She has no cervical adenopathy.  Neurological: She is alert and oriented to person, place, and time.  Skin: Skin is warm and dry.  Psychiatric: She has a normal mood and affect.          Assessment & Plan:  SOB/Cough/wheezing/URI-Gave albuterol inhaler to start. Gave hycodan to use at night for cough. Prednisone burst for 5 days. Encouraged to try mucinex twice a day with lots of water. Call if not improving. If not improving will consider abx.

## 2012-05-25 ENCOUNTER — Encounter: Payer: Self-pay | Admitting: Physician Assistant

## 2012-05-27 ENCOUNTER — Encounter: Payer: Self-pay | Admitting: Physician Assistant

## 2012-07-22 ENCOUNTER — Encounter: Payer: Self-pay | Admitting: *Deleted

## 2012-07-22 ENCOUNTER — Telehealth: Payer: Self-pay

## 2012-07-22 ENCOUNTER — Emergency Department
Admission: EM | Admit: 2012-07-22 | Discharge: 2012-07-22 | Disposition: A | Discharge status: Home / Self Care | Payer: BC Managed Care – PPO | Source: Home / Self Care

## 2012-07-22 DIAGNOSIS — J069 Acute upper respiratory infection, unspecified: Secondary | ICD-10-CM

## 2012-07-22 MED ORDER — HYDROCOD POLST-CHLORPHEN POLST 10-8 MG/5ML PO LQCR: 5.0000 mL | Freq: Two times a day (BID) | ORAL | Status: DC | PRN

## 2012-07-22 MED ORDER — BENZONATATE 200 MG PO CAPS: 200.0000 mg | ORAL_CAPSULE | Freq: Three times a day (TID) | ORAL | Status: DC | PRN

## 2012-07-22 NOTE — ED Notes (Signed)
Foy c/o productive cough and sinus congestion x 3 days.

## 2012-07-22 NOTE — Telephone Encounter (Signed)
Patient does want the tessalon perles sent to the pharmacy.     See other note.

## 2012-07-22 NOTE — Telephone Encounter (Signed)
Yes, I can't call in tussionex since its a controlled substance, I can add tessalon perles and then she would need to come in to be seen at some point.  Theres also urgent care for after hours care after work.

## 2012-07-22 NOTE — Telephone Encounter (Signed)
Regina [name] was seen in January for a cough and given Tussionex. She would like a refill on the Tussionex. She states she can not take time off of work to come in for an appointment. The cough is keeping her up at night. I advised her that Tussionex is one of those medications we normally do not refill.

## 2012-07-22 NOTE — Telephone Encounter (Signed)
Left detailed message.   

## 2012-07-22 NOTE — Telephone Encounter (Signed)
Done

## 2012-07-22 NOTE — ED Provider Notes (Signed)
History     CSN: [id number]  Arrival date & time 07/22/12  1713   None     Chief Complaint  Patient presents with  . URI    HPI  URI Symptoms Onset: 3-4 days  Description: rhinorrhea, nasal congestion, cough Modifying factors:  Multiple sick contacts  Symptoms Nasal discharge: yes Fever: no Sore throat: minimal Cough: yes Wheezing: no Ear pain: no GI symptoms: no Sick contacts: yes  Red Flags  Stiff neck: no Dyspnea: no Rash: no Swallowing difficulty: no  Sinusitis Risk Factors Headache/face pain: no Double sickening: no tooth pain: no  Allergy Risk Factors Sneezing: no Itchy scratchy throat: no Seasonal symptoms: no  Flu Risk Factors Headache: no muscle aches: no severe fatigue: no   Past Medical History  Diagnosis Date  . Migraine     Past Surgical History  Procedure Laterality Date  . Soft tissue tumor resection  06/17/07    chest/ wound reclosure 4/09'  . Dilation and curettage, diagnostic / therapeutic    . 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 Hypertension Mother   . Migraines Mother   . Depression Father   . Suicidality Father     History  Substance Use Topics  . Smoking status: Never Smoker   . Smokeless tobacco: Not on file  . Alcohol Use: No    OB History   Grav Para Term Preterm Abortions TAB SAB Ect Mult Living   5 3 3   0     3      Review of Systems  All other systems reviewed and are negative.    Allergies  Review of patient's allergies indicates no known allergies.  Home Medications   Current Outpatient Rx  Name  Route  Sig  Dispense  Refill  . albuterol (PROVENTIL HFA;VENTOLIN HFA) 108 (90 BASE) MCG/ACT inhaler   Inhalation   Inhale 2 puffs into the lungs every 6 (six) hours as needed for wheezing.   1 Inhaler   1   . atorvastatin (LIPITOR) 40 MG tablet   Oral   Take 1 tablet (40 mg total) by mouth daily.   90 tablet   3   . benzonatate  (TESSALON) 200 MG capsule   Oral   Take 1 capsule (200 mg total) by mouth 3 (three) times daily as needed for cough.   45 capsule   0   . chlorpheniramine-HYDROcodone (TUSSIONEX) 10-8 MG/5ML LQCR   Oral   Take 5 mLs by mouth every 12 (twelve) hours as needed (cough, will cause drowsiness.).   115 mL   0   . Multiple Vitamin (MULTIVITAMIN) tablet   Oral   Take 1 tablet by mouth daily.         . predniSONE (DELTASONE) 50 MG tablet      Take 1 tab for 5 days.   5 tablet   0   . promethazine (PHENERGAN) 25 MG tablet   Oral   Take 1 tablet (25 mg total) by mouth every 6 (six) hours as needed for nausea.   30 tablet   0   . SUMAtriptan (IMITREX) 25 MG tablet   Oral   Take 1 tablet (25 mg total) by mouth every 2 (two) hours as needed for migraine.   10 tablet   3     BP 126/80  Pulse 85  Temp(Src) 98.1 F (36.7 C) (Oral)  Wt 275 lb (124.739  kg)  BMI 50.29 kg/m2  SpO2 99%  LMP 07/05/2012  Physical Exam  Constitutional:  Obese    HENT:  Head: Normocephalic and atraumatic.  Right Ear: External ear normal.  Left Ear: External ear normal.  +nasal erythema, rhinorrhea bilaterally, + post oropharyngeal erythema    Eyes: Conjunctivae are normal. Pupils are equal, round, and reactive to light.  Neck: Normal range of motion. Neck supple.  Cardiovascular: Normal rate, regular rhythm and normal heart sounds.   Pulmonary/Chest: Effort normal.  Abdominal: Soft. Bowel sounds are normal.  Musculoskeletal: Normal range of motion.  Neurological: She is alert.  Skin: Skin is warm.    ED Course  Procedures (including critical care time)  Labs Reviewed - No data to display No results found.   1. URI (upper respiratory infection)       MDM  Likely viral process Supportive care.  Tussionex for cough.  Discussed supportive care and infectious/resp red flags Follow up as needed.     The patient and/or caregiver has been counseled thoroughly with regard to  treatment plan and/or medications prescribed including dosage, schedule, interactions, rationale for use, and possible side effects and they verbalize understanding. Diagnoses and expected course of recovery discussed and will return if not improved as expected or if the condition worsens. Patient and/or caregiver verbalized understanding.             Doree Albee, MD 07/22/12 [phone]

## 2013-01-22 ENCOUNTER — Encounter: Payer: Self-pay | Admitting: Family Medicine

## 2013-01-22 ENCOUNTER — Ambulatory Visit (INDEPENDENT_AMBULATORY_CARE_PROVIDER_SITE_OTHER): Payer: BC Managed Care – PPO | Admitting: Family Medicine

## 2013-01-22 VITALS — BP 125/86 | HR 83 | Wt 274.0 lb

## 2013-01-22 DIAGNOSIS — G43909 Migraine, unspecified, not intractable, without status migrainosus: Secondary | ICD-10-CM

## 2013-01-22 DIAGNOSIS — R110 Nausea: Secondary | ICD-10-CM

## 2013-01-22 MED ORDER — PROMETHAZINE HCL 25 MG PO TABS: 25.0000 mg | ORAL_TABLET | Freq: Four times a day (QID) | ORAL | Status: DC | PRN

## 2013-01-22 MED ORDER — METHYLPREDNISOLONE ACETATE 80 MG/ML IJ SUSP
80.0000 mg | Freq: Once | INTRAMUSCULAR | Status: AC
  Administered 2013-01-22: 80 mg via INTRAMUSCULAR

## 2013-01-22 MED ORDER — KETOROLAC TROMETHAMINE 60 MG/2ML IM SOLN
60.0000 mg | Freq: Once | INTRAMUSCULAR | Status: AC
  Administered 2013-01-22: 60 mg via INTRAMUSCULAR

## 2013-01-22 MED ORDER — PROMETHAZINE HCL 25 MG/ML IJ SOLN
25.0000 mg | Freq: Four times a day (QID) | INTRAMUSCULAR | Status: DC | PRN
  Administered 2013-01-22: 25 mg via INTRAMUSCULAR

## 2013-01-22 NOTE — Progress Notes (Addendum)
CC: Regina [name] is a 37 y.o. female is here for migraine since tues   Subjective: HPI:  Patient complains of a left-sided pulsatile headache is localized above the ear somewhat near the occiput that has been present since Tuesday of this week. Is currently moderate in severity it has not improved with 2 doses of Imitrex t Tuesday nor one dose yesterday. Has been associated with photophobia, nausea, without any other motor or sensory disturbances. It is identical in character and severity to prior migraines most recently having one in April. She denies worst headache of life, positional component, awakening because of headache nor any differences to prior migraines.  She denies fevers, chills, vision loss, confusion, coordination difficulty, recent tick bite, nor neck or back pain.    Review Of Systems Outlined In HPI  Past Medical History  Diagnosis Date  . Migraine      Family History  Problem Relation Age of Onset  . Hypertension Mother   . Migraines Mother   . Depression Father   . Suicidality Father      History  Substance Use Topics  . Smoking status: Never Smoker   . Smokeless tobacco: Not on file  . Alcohol Use: No     Objective: Filed Vitals:   01/22/13 1052  BP: 125/86  Pulse: 83    General: Alert and Oriented, No Acute Distress HEENT: Pupils equal, round, reactive to light. Conjunctivae clear.  External ears unremarkable, canals clear with intact TMs with appropriate landmarks.  Middle ear appears open without effusion. Pink inferior turbinates.  Moist mucous membranes, pharynx without inflammation nor lesions.  Neck supple without palpable lymphadenopathy nor abnormal masses. Lungs: Clear to auscultation bilaterally, no wheezing/ronchi/rales.  Comfortable work of breathing. Good air movement. Neuro: CN II-XII grossly intact, full strength/rom of all four extremities, C5/L4/S1 DTRs 2/4 bilaterally, gait normal, rapid alternating movements normal, heel-shin test  normal, Rhomberg normal. Cardiac: Regular rate and rhythm. Extremities: No peripheral edema.  Strong peripheral pulses.  Mental Status: No depression, anxiety, nor agitation. Skin: Warm and dry.  Assessment & Plan: Regina [name] was seen today for migraine since tues.  Diagnoses and associated orders for this visit:  Migraine - promethazine (PHENERGAN) 25 MG tablet; Take 1 tablet (25 mg total) by mouth every 6 (six) hours as needed for nausea.  Nausea alone - promethazine (PHENERGAN) 25 MG tablet; Take 1 tablet (25 mg total) by mouth every 6 (six) hours as needed for nausea.    Migraine: Given classic presentation of migraine along with her prior migraines will hold off on neuroimaging keeping in mind her history of teratoma. Fortunately no red flags right now. We'll treat with IM Phenergan 25 mg, Toradol 60 mg IM, Depo-Medrol 80 mg. She is not driving Nausea: Refilled promethazine  Given sample of Zomig Nasil 5mg  to try for future migraines since imitrex no help this round.  Return if symptoms worsen or fail to improve.

## 2013-01-22 NOTE — Addendum Note (Signed)
Addended by: Avon Gully C on: 01/22/2013 11:24 AM   Modules accepted: Orders

## 2013-02-02 ENCOUNTER — Emergency Department
Admission: EM | Admit: 2013-02-02 | Discharge: 2013-02-02 | Disposition: A | Discharge status: Home / Self Care | Payer: BC Managed Care – PPO | Source: Home / Self Care | Attending: Family Medicine | Admitting: Family Medicine

## 2013-02-02 DIAGNOSIS — J069 Acute upper respiratory infection, unspecified: Secondary | ICD-10-CM

## 2013-02-02 DIAGNOSIS — J029 Acute pharyngitis, unspecified: Secondary | ICD-10-CM

## 2013-02-02 MED ORDER — AZITHROMYCIN 250 MG PO TABS: ORAL_TABLET | ORAL | Status: DC

## 2013-02-02 MED ORDER — GUAIFENESIN-CODEINE 100-10 MG/5ML PO SOLN: 5.0000 mL | Freq: Three times a day (TID) | ORAL | Status: DC | PRN

## 2013-02-02 NOTE — ED Provider Notes (Signed)
CSN: [id number]     Arrival date & time 02/02/13  1703 History   First MD Initiated Contact with Patient 02/02/13 1723     Chief Complaint  Patient presents with  . Sore Throat    x few days  . Cough    worse last night    HPI  URI Symptoms Onset: 3-4 days  Description: rhinorrhea, nasal congestion, sore throat  Modifying factors:  + strep exposure in daughter  Symptoms Nasal discharge: yes Fever: no Sore throat: yes Cough: yes Wheezing: no Ear pain: no GI symptoms: no Sick contacts: yes  Red Flags  Stiff neck: no Dyspnea: no Rash: no Swallowing difficulty: no  Sinusitis Risk Factors Headache/face pain: no Double sickening: no tooth pain: no  Allergy Risk Factors Sneezing: no Itchy scratchy throat: no Seasonal symptoms: no  Flu Risk Factors Headache: no muscle aches: no severe fatigue: no   Past Medical History  Diagnosis Date  . Migraine    Past Surgical History  Procedure Laterality Date  . Soft tissue tumor resection  06/17/07    chest/ wound reclosure 4/09'  . Dilation and curettage, diagnostic / therapeutic    . 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 Hypertension Mother   . Migraines Mother   . Depression Father   . Suicidality Father    History  Substance Use Topics  . Smoking status: Never Smoker   . Smokeless tobacco: Not on file  . Alcohol Use: No   OB History   Grav Para Term Preterm Abortions TAB SAB Ect Mult Living   5 3 3   0     3     Review of Systems  All other systems reviewed and are negative.    Allergies  Review of patient's allergies indicates no known allergies.  Home Medications   Current Outpatient Rx  Name  Route  Sig  Dispense  Refill  . fexofenadine-pseudoephedrine (ALLEGRA-D 24) 180-240 MG per 24 hr tablet   Oral   Take 1 tablet by mouth daily.         . Multiple Vitamin (MULTIVITAMIN) tablet   Oral   Take 1 tablet by mouth daily.         .  promethazine (PHENERGAN) 25 MG tablet   Oral   Take 1 tablet (25 mg total) by mouth every 6 (six) hours as needed for nausea.   30 tablet   1   . SUMAtriptan (IMITREX) 25 MG tablet   Oral   Take 1 tablet (25 mg total) by mouth every 2 (two) hours as needed for migraine.   10 tablet   3   . azithromycin (ZITHROMAX) 250 MG tablet      Take 2 tabs PO x 1 dose, then 1 tab PO QD x 4 days   6 tablet   0   . guaiFENesin-codeine 100-10 MG/5ML syrup   Oral   Take 5 mLs by mouth 3 (three) times daily as needed for cough.   120 mL   0    BP 135/86  Pulse 87  Temp(Src) 98.2 F (36.8 C) (Oral)  Ht 5\' 2"  (1.575 m)  Wt 278 lb (126.1 kg)  BMI 50.83 kg/m2  SpO2 95% Physical Exam  Constitutional:  Obese    HENT:  Head: Normocephalic and atraumatic.  Right Ear: External ear normal.  Left Ear: External ear normal.  Mouth/Throat: No oropharyngeal exudate.  +nasal  erythema, rhinorrhea bilaterally, + post oropharyngeal erythema    Eyes: Pupils are equal, round, and reactive to light.  Neck: Normal range of motion. Neck supple.  Cardiovascular: Normal rate, regular rhythm and normal heart sounds.   Pulmonary/Chest: Effort normal and breath sounds normal.  Abdominal: Soft.  Musculoskeletal: Normal range of motion.  Lymphadenopathy:    She has no cervical adenopathy.  Neurological: She is alert.  Skin: Skin is warm.    ED Course  Procedures (including critical care time) Labs Review Labs Reviewed  POCT RAPID STREP A (OFFICE) - Normal   Imaging Review No results found.  MDM   1. Sore throat   2. URI (upper respiratory infection)    Likley viral process Rapid strep negative.  Will place on zpak if sxs worsen given sick contact.  Codeine with mucinex for cough.  Discussed supportive care and infectious red flags.     The patient and/or caregiver has been counseled thoroughly with regard to treatment plan and/or medications prescribed including dosage, schedule,  interactions, rationale for use, and possible side effects and they verbalize understanding. Diagnoses and expected course of recovery discussed and will return if not improved as expected or if the condition worsens. Patient and/or caregiver verbalized understanding.         Doree Albee, MD 02/02/13 [phone]

## 2013-02-02 NOTE — ED Notes (Signed)
Regina [name] complains of sore throat, cough and congestion for a few days. Denies fever, chills or sweats.

## 2013-02-05 ENCOUNTER — Telehealth: Payer: Self-pay | Admitting: *Deleted

## 2013-02-13 ENCOUNTER — Telehealth: Payer: Self-pay | Admitting: *Deleted

## 2013-02-13 MED ORDER — TRAMADOL HCL 50 MG PO TABS: ORAL_TABLET | ORAL | Status: DC

## 2013-02-13 NOTE — Telephone Encounter (Signed)
Pt seen Dr. Ivan Anchors on 01/22/13 for a migraine and he gave her some injections of toradol, phenergan and solumedrol. Pt states the Imitrex that was given makes her heart flutter. States she is getting another migraine and would like to know if you can prescribe her something else.  Meyer Cory, LPN

## 2013-02-13 NOTE — Telephone Encounter (Signed)
LMOM.  Misty Ahmad, LPN  

## 2013-02-13 NOTE — Telephone Encounter (Signed)
Discontinue Imitrex, we will switch to tramadol as needed. If she continues to have migraines she should come back to see me and we can consider a migraine preventative medication. Prescription is in my out box.

## 2013-03-25 ENCOUNTER — Ambulatory Visit (INDEPENDENT_AMBULATORY_CARE_PROVIDER_SITE_OTHER): Payer: BC Managed Care – PPO | Admitting: Sports Medicine

## 2013-03-25 ENCOUNTER — Encounter: Payer: Self-pay | Admitting: Sports Medicine

## 2013-03-25 ENCOUNTER — Ambulatory Visit (INDEPENDENT_AMBULATORY_CARE_PROVIDER_SITE_OTHER): Payer: BC Managed Care – PPO

## 2013-03-25 VITALS — BP 129/85 | HR 88 | Wt 273.0 lb

## 2013-03-25 DIAGNOSIS — D499 Neoplasm of unspecified behavior of unspecified site: Secondary | ICD-10-CM

## 2013-03-25 DIAGNOSIS — F32A Depression, unspecified: Secondary | ICD-10-CM | POA: Insufficient documentation

## 2013-03-25 DIAGNOSIS — G43909 Migraine, unspecified, not intractable, without status migrainosus: Secondary | ICD-10-CM

## 2013-03-25 DIAGNOSIS — Z09 Encounter for follow-up examination after completed treatment for conditions other than malignant neoplasm: Secondary | ICD-10-CM

## 2013-03-25 DIAGNOSIS — Z299 Encounter for prophylactic measures, unspecified: Secondary | ICD-10-CM

## 2013-03-25 DIAGNOSIS — F39 Unspecified mood [affective] disorder: Secondary | ICD-10-CM

## 2013-03-25 DIAGNOSIS — F329 Major depressive disorder, single episode, unspecified: Secondary | ICD-10-CM | POA: Insufficient documentation

## 2013-03-25 DIAGNOSIS — J069 Acute upper respiratory infection, unspecified: Secondary | ICD-10-CM

## 2013-03-25 DIAGNOSIS — E785 Hyperlipidemia, unspecified: Secondary | ICD-10-CM

## 2013-03-25 DIAGNOSIS — Z0000 Encounter for general adult medical examination without abnormal findings: Secondary | ICD-10-CM

## 2013-03-25 LAB — CBC
HCT: 37.1 % (ref 36.0–46.0)
Hemoglobin: 12.6 g/dL (ref 12.0–15.0)
MCH: 27.8 pg (ref 26.0–34.0)
MCHC: 34 g/dL (ref 30.0–36.0)
MCV: 81.9 fL (ref 78.0–100.0)
Platelets: 338 K/uL (ref 150–400)
RBC: 4.53 MIL/uL (ref 3.87–5.11)
RDW: 13.3 % (ref 11.5–15.5)
WBC: 8 K/uL (ref 4.0–10.5)

## 2013-03-25 LAB — COMPREHENSIVE METABOLIC PANEL WITH GFR
ALT: 12 U/L (ref 0–35)
AST: 12 U/L (ref 0–37)
Albumin: 3.9 g/dL (ref 3.5–5.2)
Alkaline Phosphatase: 78 U/L (ref 39–117)
Calcium: 8.5 mg/dL (ref 8.4–10.5)
Chloride: 104 meq/L (ref 96–112)
Potassium: 3.9 meq/L (ref 3.5–5.3)
Sodium: 137 meq/L (ref 135–145)
Total Protein: 6.5 g/dL (ref 6.0–8.3)

## 2013-03-25 LAB — LIPID PANEL
Cholesterol: 195 mg/dL (ref 0–200)
HDL: 45 mg/dL (ref >39)
LDL Cholesterol: 125 mg/dL — ABNORMAL HIGH (ref 0–99)
Total CHOL/HDL Ratio: 4.3 Ratio
Triglycerides: 123 mg/dL (ref <150)
VLDL: 25 mg/dL (ref 0–40)

## 2013-03-25 LAB — COMPREHENSIVE METABOLIC PANEL
BUN: 11 mg/dL (ref 6–23)
CO2: 26 mEq/L (ref 19–32)
Creat: 0.46 mg/dL — ABNORMAL LOW (ref 0.50–1.10)
Glucose, Bld: 81 mg/dL (ref 70–99)
Total Bilirubin: 0.4 mg/dL (ref 0.3–1.2)

## 2013-03-25 MED ORDER — CITALOPRAM HYDROBROMIDE 20 MG PO TABS: 20.0000 mg | ORAL_TABLET | Freq: Every day | ORAL | Status: DC

## 2013-03-25 MED ORDER — BENZONATATE 200 MG PO CAPS
200.0000 mg | ORAL_CAPSULE | Freq: Three times a day (TID) | ORAL | Status: DC | PRN
Start: 2013-03-25 — End: 2013-05-21

## 2013-03-25 MED ORDER — TOPIRAMATE 50 MG PO TABS: ORAL_TABLET | ORAL | Status: DC

## 2013-03-25 NOTE — Assessment & Plan Note (Signed)
Has had 4-5 in the last month. These do seem to be catamenial however she declines birth control. Adding Topamax, Imitrex does not abort the migraine tramadol dose.

## 2013-03-25 NOTE — Assessment & Plan Note (Signed)
Mild. A chest x-ray, Tessalon Perles. Return in 2 weeks if no better.

## 2013-03-25 NOTE — Assessment & Plan Note (Signed)
Up-to-date on cervical cancer screening. Next Pap will be next year in May.

## 2013-03-25 NOTE — Assessment & Plan Note (Signed)
Status post excision. Ordering routine chest x-ray.

## 2013-03-25 NOTE — Assessment & Plan Note (Signed)
Related to multiple work stressors, likely seasonal affective disorder as well, and memories of her father suicide. She will establish with psychotherapy at work. I am going to start citalopram like to see her back in 2 weeks. Predominant symptom is anxiety.

## 2013-03-25 NOTE — Assessment & Plan Note (Signed)
Rechecking routine blood work and lipids.

## 2013-03-25 NOTE — Progress Notes (Signed)
  Subjective:    CC: CPE  HPI:  Preventive measure: Up-to-date on cervical cancer screening, complete physical will be performed today.  Stress: Lots of work stress, also thinking about her father committed suicide sometime ago, the weather is not helping, predominant symptoms are stress and anxiety rather than depression, PHQ9 score was 12. She does desire pharmacologic intervention.  History of teratoma excision: Due for chest x-ray for routine monitoring.   Migraines: Multiple migraines over the past month, he seemed to be well controlled with tramadol however they are recurrent. She declines any form of birth control pill they do seem catamenial and occur in close association with her menstrual cycles.  Cough: recently saw Dr. Alvester Morin in urgent care, still has a mild cough, nonproductive.  Past medical history, Surgical history, Family history not pertinant except as noted below, Social history, Allergies, and medications have been entered into the medical record, reviewed, and no changes needed.   Review of Systems: No headache, visual changes, nausea, vomiting, diarrhea, constipation, dizziness, abdominal pain, skin rash, fevers, chills, night sweats, swollen lymph nodes, weight loss, chest pain, body aches, joint swelling, muscle aches, shortness of breath, mood changes, visual or auditory hallucinations.  Objective:    General: Well Developed, well nourished, and in no acute distress.  Neuro: Alert and oriented x3, extra-ocular muscles intact, sensation grossly intact.  HEENT: Normocephalic, atraumatic, pupils equal round reactive to light, neck supple, no masses, no lymphadenopathy, thyroid nonpalpable. Oropharynx, external ear canals unremarkable. Skin: Warm and dry, no rashes noted.  Cardiac: Regular rate and rhythm, no murmurs rubs or gallops.  Respiratory: Clear to auscultation bilaterally. Not using accessory muscles, speaking in full sentences.  Abdominal: Soft, nontender,  nondistended, positive bowel sounds, no masses, no organomegaly.  Musculoskeletal: Shoulder, elbow, wrist, hip, knee, ankle stable, and with full range of motion  Impression and Recommendations:    The patient was counselled, risk factors were discussed, anticipatory guidance given.

## 2013-04-09 ENCOUNTER — Encounter: Payer: Self-pay | Admitting: Sports Medicine

## 2013-04-09 ENCOUNTER — Ambulatory Visit (INDEPENDENT_AMBULATORY_CARE_PROVIDER_SITE_OTHER): Payer: BC Managed Care – PPO | Admitting: Sports Medicine

## 2013-04-09 VITALS — BP 136/86 | HR 82 | Wt 268.0 lb

## 2013-04-09 DIAGNOSIS — D499 Neoplasm of unspecified behavior of unspecified site: Secondary | ICD-10-CM

## 2013-04-09 DIAGNOSIS — F39 Unspecified mood [affective] disorder: Secondary | ICD-10-CM

## 2013-04-09 MED ORDER — TRAZODONE HCL 150 MG PO TABS: 150.0000 mg | ORAL_TABLET | Freq: Every day | ORAL | Status: DC

## 2013-04-09 NOTE — Assessment & Plan Note (Signed)
Stable, most recent chest x-ray was negative.

## 2013-04-09 NOTE — Assessment & Plan Note (Signed)
Mood is overall the same as expected. Worst symptom currently is insomnia and I am going to add the atypical antidepressant trazodone to help. This will work in conjunction with citalopram.

## 2013-04-09 NOTE — Progress Notes (Signed)
  Subjective:    CC: Followup  HPI: Anxiety/depression: Started citalopram to a half weeks ago, not yet feeling a response as expected, no worsening, no suicidal or homicidal ideation. Currently her worse symptoms insomnia and she thinks she would feel a lot better if she could just sleep through the night.  Migraines: Stable. Did have a single migraine since starting Topamax, also understands that this could take some time to start working.  Mediastinal teratoma: Stable, most recent chest x-ray showed no change. He symptomatically  Past medical history, Surgical history, Family history not pertinant except as noted below, Social history, Allergies, and medications have been entered into the medical record, reviewed, and no changes needed.   Review of Systems: No fevers, chills, night sweats, weight loss, chest pain, or shortness of breath.   Objective:    General: Well Developed, well nourished, and in no acute distress.  Neuro: Alert and oriented x3, extra-ocular muscles intact, sensation grossly intact.  HEENT: Normocephalic, atraumatic, pupils equal round reactive to light, neck supple, no masses, no lymphadenopathy, thyroid nonpalpable.  Skin: Warm and dry, no rashes. Cardiac: Regular rate and rhythm, no murmurs rubs or gallops, no lower extremity edema.  Respiratory: Clear to auscultation bilaterally. Not using accessory muscles, speaking in full sentences.  Impression and Recommendations:

## 2013-05-04 ENCOUNTER — Ambulatory Visit (INDEPENDENT_AMBULATORY_CARE_PROVIDER_SITE_OTHER): Payer: BC Managed Care – PPO | Admitting: Sports Medicine

## 2013-05-04 ENCOUNTER — Encounter: Payer: Self-pay | Admitting: Sports Medicine

## 2013-05-04 VITALS — BP 116/69 | HR 85 | Wt 268.0 lb

## 2013-05-04 DIAGNOSIS — F39 Unspecified mood [affective] disorder: Secondary | ICD-10-CM

## 2013-05-04 DIAGNOSIS — E669 Obesity, unspecified: Secondary | ICD-10-CM

## 2013-05-04 NOTE — Assessment & Plan Note (Signed)
Not yet ready to discuss weight loss medication, she is going to think about it and will return in the middle of February with a decision.

## 2013-05-04 NOTE — Assessment & Plan Note (Signed)
Extremely well controlled with 20 mg of citalopram, persistent insomnia was also mitigated with the addition of trazodone. No changes.

## 2013-05-04 NOTE — Progress Notes (Signed)
  Subjective:    CC: Followup  HPI: Depression/anxiety: Well controlled on citalopram, stable.  Insomnia: Now well controlled with trazodone each bedtime.  Obesity: Desires to discuss this at a future visit.  Past medical history, Surgical history, Family history not pertinant except as noted below, Social history, Allergies, and medications have been entered into the medical record, reviewed, and no changes needed.   Review of Systems: No fevers, chills, night sweats, weight loss, chest pain, or shortness of breath.   Objective:    General: Well Developed, well nourished, and in no acute distress.  Neuro: Alert and oriented x3, extra-ocular muscles intact, sensation grossly intact.  HEENT: Normocephalic, atraumatic, pupils equal round reactive to light, neck supple, no masses, no lymphadenopathy, thyroid nonpalpable.  Skin: Warm and dry, no rashes. Cardiac: Regular rate and rhythm, no murmurs rubs or gallops, no lower extremity edema.  Respiratory: Clear to auscultation bilaterally. Not using accessory muscles, speaking in full sentences.  Impression and Recommendations:

## 2013-05-15 ENCOUNTER — Telehealth: Payer: Self-pay

## 2013-05-15 MED ORDER — TRAMADOL HCL 50 MG PO TABS: ORAL_TABLET | ORAL | Status: DC

## 2013-05-15 NOTE — Telephone Encounter (Signed)
Prescription is in my box 

## 2013-05-15 NOTE — Telephone Encounter (Signed)
Patient called stated that she was prescribed Tramadol for migraines. She is requesting a refill? Regina [name],CMA

## 2013-05-16 ENCOUNTER — Encounter: Payer: Self-pay | Admitting: Emergency Medicine

## 2013-05-16 ENCOUNTER — Emergency Department
Admission: EM | Admit: 2013-05-16 | Discharge: 2013-05-16 | Disposition: A | Discharge status: Home / Self Care | Payer: BC Managed Care – PPO | Source: Home / Self Care | Attending: Emergency Medicine | Admitting: Emergency Medicine

## 2013-05-16 DIAGNOSIS — R4589 Other symptoms and signs involving emotional state: Secondary | ICD-10-CM

## 2013-05-16 DIAGNOSIS — R0789 Other chest pain: Secondary | ICD-10-CM

## 2013-05-16 DIAGNOSIS — F430 Acute stress reaction: Principal | ICD-10-CM

## 2013-05-16 DIAGNOSIS — F410 Panic disorder [episodic paroxysmal anxiety] without agoraphobia: Secondary | ICD-10-CM

## 2013-05-16 HISTORY — DX: Morbid (severe) obesity due to excess calories: E66.01

## 2013-05-16 HISTORY — DX: Panic disorder (episodic paroxysmal anxiety): F41.0

## 2013-05-16 HISTORY — DX: Other specified anxiety disorders: F41.8

## 2013-05-16 MED ORDER — ALPRAZOLAM 0.25 MG PO TABS: ORAL_TABLET | ORAL | Status: DC

## 2013-05-16 NOTE — ED Notes (Signed)
PCP not available today. Desires help with panic attacks.+

## 2013-05-16 NOTE — ED Provider Notes (Signed)
CSN: [id number]     Arrival date & time 05/16/13  1035 History   First MD Initiated Contact with Patient 05/16/13 1042     Chief Complaint  Patient presents with  . Panic Attack   patient is seen in urgent care on Saturday 05/16/13  Patient is a 38 y.o. female presenting with anxiety. The history is provided by the patient.  Anxiety This is a recurrent problem. The current episode started 3 to 5 hours ago. The problem has been gradually worsening. Associated symptoms include chest pain (nonexertional. No radiation) and shortness of breath (At rest.). Pertinent negatives include no abdominal pain and no headaches. Exacerbated by: Discussing acute family stressful situation with another family members. Nothing (But she recalls that when she had her last panic attack in 2005, Xanax helped) relieves the symptoms. She has tried nothing for the symptoms.   Just yesterday, there is a very stressful family situation, discussed amongst various family members she feels that it's caused acute severe stress and anxiety. Then early this morning felt what she describes as a panic attack, shortness of breath and air hunger at rest, improves somewhat when she doesn't think about stressful family situation. Denies exertional chest pain or dyspnea. No radiation. No syncope or focal neurologic symptoms. Occasionally her hands felt tingly when she feels panicky but that resolved when she relaxes. Past Medical History  Diagnosis Date  . Migraine   . Depression with anxiety   . Panic disorder   . Morbid obesity    Past Surgical History  Procedure Laterality Date  . Soft tissue tumor resection  06/17/07    chest/ wound reclosure 4/09'  . Dilation and curettage, diagnostic / therapeutic    . 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 Hypertension Mother   . Migraines Mother   . Depression Father   . Suicidality Father    History  Substance Use Topics  .  Smoking status: Never Smoker   . Smokeless tobacco: Not on file  . Alcohol Use: No   OB History   Grav Para Term Preterm Abortions TAB SAB Ect Mult Living   5 3 3   0     3     Review of Systems  HENT: Negative.   Respiratory: Positive for shortness of breath (At rest.). Negative for cough and wheezing.   Cardiovascular: Positive for chest pain (nonexertional. No radiation). Negative for palpitations and leg swelling.  Gastrointestinal: Negative.  Negative for abdominal pain.  Neurological: Negative.  Negative for headaches.  Psychiatric/Behavioral: Positive for dysphoric mood (mild). Negative for suicidal ideas and self-injury. The patient is nervous/anxious.   All other systems reviewed and are negative.    Allergies  Sumatriptan  Home Medications   Current Outpatient Rx  Name  Route  Sig  Dispense  Refill  . ALPRAZolam (XANAX) 0.25 MG tablet      Take 1 or 2 tablets every 8 hours as needed for anxiety   20 tablet   0   . benzonatate (TESSALON) 200 MG capsule   Oral   Take 1 capsule (200 mg total) by mouth 3 (three) times daily as needed for cough.   45 capsule   0   . citalopram (CELEXA) 20 MG tablet   Oral   Take 1 tablet (20 mg total) by mouth daily.   90 tablet   0   . fexofenadine-pseudoephedrine (ALLEGRA-D 24) 180-240 MG per 24  hr tablet   Oral   Take 1 tablet by mouth daily.         . Multiple Vitamin (MULTIVITAMIN) tablet   Oral   Take 1 tablet by mouth daily.         . promethazine (PHENERGAN) 25 MG tablet   Oral   Take 1 tablet (25 mg total) by mouth every 6 (six) hours as needed for nausea.   30 tablet   1   . topiramate (TOPAMAX) 50 MG tablet      One half tab by mouth twice a day for one week then one tab by mouth daily.   90 tablet   0   . traMADol (ULTRAM) 50 MG tablet      1-2 tabs by mouth Q8 hours, maximum 6 tabs per day.   90 tablet   0   . traZODone (DESYREL) 150 MG tablet   Oral   Take 1 tablet (150 mg total) by mouth  at bedtime.   30 tablet   3    BP 129/84  Pulse 82  Temp(Src) 97.7 F (36.5 C) (Oral)  Resp 16  SpO2 97%  LMP 05/06/2013 Physical Exam  Nursing note and vitals reviewed. Constitutional: She is oriented to person, place, and time. Vital signs are normal. She appears well-developed and well-nourished. She is cooperative.  Non-toxic appearance. No distress.  Mildly anxious, but alert and cooperative, no acute cardiorespiratory distress.  HENT:  Head: Normocephalic and atraumatic.  Eyes: Conjunctivae and EOM are normal. Pupils are equal, round, and reactive to light. No scleral icterus.  Neck: Normal range of motion.  Cardiovascular: Normal rate, regular rhythm, normal heart sounds and intact distal pulses.  Exam reveals no gallop and no friction rub.   No murmur heard. Pulmonary/Chest: Effort normal and breath sounds normal. No respiratory distress. She has no wheezes. She has no rales.  Abdominal: Soft. She exhibits no distension. There is no tenderness.  Musculoskeletal: Normal range of motion.  Neurological: She is alert and oriented to person, place, and time.  Skin: Skin is warm. She is not diaphoretic.  Psychiatric: She has a normal mood and affect.   Extremities: No cyanosis clubbing or edema. O2 saturation 97% on room air ED Course  Procedures (including critical care time) Labs Review Labs Reviewed - No data to display Imaging Review No results found.  EKG Interpretation    Date/Time:    Ventricular Rate:    PR Interval:    QRS Duration:   QT Interval:    QTC Calculation:   R Axis:     Text Interpretation:              MDM   1. Panic attack due to exceptional stress   2. Atypical chest pain    Clinically, she has classic panic attack/anxiety without any red flags for cardiorespiratory event. She declined chest x-ray or EKG or any testing  Treatment options discussed, as well as risks, benefits, alternatives. Patient voiced understanding and  agreement with the following plans:  Over 25 minutes spent, greater than 50% of the time spent for counseling and coordination of care.  We found in Epic chart that patient left a message yesterday, Friday after 5 PM, requesting refill of tramadol for her headaches, and message was not received by Dr. Dianah Field until yesterday, Friday evening, after the office had closed, and Dr. Dianah Field had left a written refill for tramadol in the family medicine office. We were able to locate the  hard copy of that tramadol prescription, and I gave patient Dr. Landry Corporal original prescription.  I also prescribed today Xanax 0.25 mg. #20. No refills. Precautions and instructions.  Followup with Dr. Dianah Field and/or counselor next week Precautions discussed. Red flags discussed. Questions invited and answered. Patient voiced understanding and agreement.   Jacqulyn Cane, MD 05/16/13 2145

## 2013-05-18 ENCOUNTER — Other Ambulatory Visit: Payer: Self-pay

## 2013-05-18 MED ORDER — TRAMADOL HCL 50 MG PO TABS: ORAL_TABLET | ORAL | Status: DC

## 2013-05-18 NOTE — Telephone Encounter (Signed)
Rx has been faxed to [facility]. Rhonda Cunningham,CMA

## 2013-05-21 ENCOUNTER — Ambulatory Visit (INDEPENDENT_AMBULATORY_CARE_PROVIDER_SITE_OTHER): Payer: BC Managed Care – PPO | Admitting: Sports Medicine

## 2013-05-21 ENCOUNTER — Encounter: Payer: Self-pay | Admitting: Sports Medicine

## 2013-05-21 VITALS — BP 110/79 | HR 71 | Wt 264.0 lb

## 2013-05-21 DIAGNOSIS — F4323 Adjustment disorder with mixed anxiety and depressed mood: Secondary | ICD-10-CM

## 2013-05-21 MED ORDER — CITALOPRAM HYDROBROMIDE 20 MG PO TABS: 30.0000 mg | ORAL_TABLET | Freq: Every day | ORAL | Status: DC

## 2013-05-21 MED ORDER — ALPRAZOLAM 1 MG PO TABS: ORAL_TABLET | ORAL | Status: DC

## 2013-05-21 NOTE — Assessment & Plan Note (Signed)
Increased anxiety and panic revolves around a family member being convicted of sexual indecencies with a child. Overall doing well on citalopram however we will increase the dose to 30 mg temporarily during this period of stress. I am happy with her taking an occasional alprazolam, we are going to increase to 1 mg tablets, she will start with one half tab as needed. I'd like to see her back in one month to see how things are going.

## 2013-05-21 NOTE — Progress Notes (Signed)
  Subjective:    CC: Followup  HPI: [employer] is back for followup after an urgent care visit for a panic attack. She is having some family,, one of her family members has been convicted of sexual indecencies with a minor. This is placed great stress to her family, and she has episodes of crying, trouble breathing, chest pain, and feelings of doom without any radiation to the arm or exertional component. She was seen in urgent care, alprazolam was prescribed at 0.25 mg and tends to help her symptoms somewhat. Symptoms are moderate, persistent, in between flares of panic her depression is fairly well controlled.  Past medical history, Surgical history, Family history not pertinant except as noted below, Social history, Allergies, and medications have been entered into the medical record, reviewed, and no changes needed.   Review of Systems: No fevers, chills, night sweats, weight loss, chest pain, or shortness of breath.   Objective:    General: Well Developed, well nourished, and in no acute distress. Tearful in exam room. Neuro: Alert and oriented x3, extra-ocular muscles intact, sensation grossly intact.  HEENT: Normocephalic, atraumatic, pupils equal round reactive to light, neck supple, no masses, no lymphadenopathy, thyroid nonpalpable.  Skin: Warm and dry, no rashes. Cardiac: Regular rate and rhythm, no murmurs rubs or gallops, no lower extremity edema.  Respiratory: Clear to auscultation bilaterally. Not using accessory muscles, speaking in full sentences. Impression and Recommendations:

## 2013-06-04 ENCOUNTER — Encounter: Payer: Self-pay | Admitting: Sports Medicine

## 2013-06-04 ENCOUNTER — Telehealth: Payer: Self-pay

## 2013-06-04 NOTE — Telephone Encounter (Signed)
Letter in box. 

## 2013-06-04 NOTE — Telephone Encounter (Signed)
Patient left a vm stating that she needs a note faxed her job stating that she is being treated for migraines. Fax number is [phone]. Rhonda Cunningham,CMA

## 2013-06-04 NOTE — Telephone Encounter (Signed)
Letter has been faxed to [phone]. Rhonda Cunningham,CMA

## 2013-06-07 ENCOUNTER — Encounter: Payer: Self-pay | Admitting: Emergency Medicine

## 2013-06-07 ENCOUNTER — Emergency Department
Admission: EM | Admit: 2013-06-07 | Discharge: 2013-06-07 | Disposition: A | Discharge status: Home / Self Care | Payer: BC Managed Care – PPO | Source: Home / Self Care | Attending: Family Medicine | Admitting: Family Medicine

## 2013-06-07 DIAGNOSIS — R059 Cough, unspecified: Secondary | ICD-10-CM

## 2013-06-07 DIAGNOSIS — R05 Cough: Secondary | ICD-10-CM

## 2013-06-07 DIAGNOSIS — R5383 Other fatigue: Secondary | ICD-10-CM

## 2013-06-07 DIAGNOSIS — R69 Illness, unspecified: Principal | ICD-10-CM

## 2013-06-07 DIAGNOSIS — R5381 Other malaise: Secondary | ICD-10-CM

## 2013-06-07 DIAGNOSIS — J111 Influenza due to unidentified influenza virus with other respiratory manifestations: Secondary | ICD-10-CM

## 2013-06-07 LAB — POCT INFLUENZA A/B
INFLUENZA A, POC: NEGATIVE
INFLUENZA B, POC: NEGATIVE

## 2013-06-07 MED ORDER — BENZONATATE 200 MG PO CAPS: 200.0000 mg | ORAL_CAPSULE | Freq: Every day | ORAL | Status: DC

## 2013-06-07 MED ORDER — OSELTAMIVIR PHOSPHATE 75 MG PO CAPS: 75.0000 mg | ORAL_CAPSULE | Freq: Two times a day (BID) | ORAL | Status: DC

## 2013-06-07 NOTE — ED Provider Notes (Signed)
CSN: [id number]     Arrival date & time 06/07/13  1310 History   First MD Initiated Contact with Patient 06/07/13 1327     Chief Complaint  Patient presents with  . Nasal Congestion  . Cough      HPI Comments: Patient developed malaise and fatigue yesterday, followed by a cough, arthralgias, myalgias, mild sore throat, cough, mild sinus congestion, and cough.  The history is provided by the patient.    Past Medical History  Diagnosis Date  . Migraine   . Depression with anxiety   . Panic disorder   . Morbid obesity    Past Surgical History  Procedure Laterality Date  . Soft tissue tumor resection  06/17/07    chest/ wound reclosure 4/09'  . Dilation and curettage, diagnostic / therapeutic    . 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 Hypertension Mother   . Migraines Mother   . Depression Father   . Suicidality Father    History  Substance Use Topics  . Smoking status: Never Smoker   . Smokeless tobacco: Not on file  . Alcohol Use: No   OB History   Grav Para Term Preterm Abortions TAB SAB Ect Mult Living   5 3 3   0     3     Review of Systems + sore throat + cough + hoarse No pleuritic pain No wheezing + nasal congestion + post-nasal drainage No sinus pain/pressure No itchy/red eyes No earache No hemoptysis No SOB No fever, + chills No nausea No vomiting No abdominal pain No diarrhea No urinary symptoms No skin rash + fatigue + myalgias No headache Used OTC meds without relief  Allergies  Sumatriptan  Home Medications   Current Outpatient Rx  Name  Route  Sig  Dispense  Refill  . ALPRAZolam (XANAX) 1 MG tablet      One half to one tablet up to 3 times a day as needed for anxiety/panic.   30 tablet   0   . benzonatate (TESSALON) 200 MG capsule   Oral   Take 1 capsule (200 mg total) by mouth at bedtime. Take as needed for cough   12 capsule   0   . citalopram (CELEXA) 20 MG tablet  Oral   Take 1.5 tablets (30 mg total) by mouth daily.   135 tablet   1   . fexofenadine-pseudoephedrine (ALLEGRA-D 24) 180-240 MG per 24 hr tablet   Oral   Take 1 tablet by mouth daily.         . Multiple Vitamin (MULTIVITAMIN) tablet   Oral   Take 1 tablet by mouth daily.         Marland Kitchen oseltamivir (TAMIFLU) 75 MG capsule   Oral   Take 1 capsule (75 mg total) by mouth every 12 (twelve) hours.   10 capsule   0   . promethazine (PHENERGAN) 25 MG tablet   Oral   Take 1 tablet (25 mg total) by mouth every 6 (six) hours as needed for nausea.   30 tablet   1   . topiramate (TOPAMAX) 50 MG tablet      One half tab by mouth twice a day for one week then one tab by mouth daily.   90 tablet   0   . traMADol (ULTRAM) 50 MG tablet      1-2 tabs by mouth Q8 hours, maximum 6 tabs  per day.   90 tablet   0   . traZODone (DESYREL) 150 MG tablet   Oral   Take 1 tablet (150 mg total) by mouth at bedtime.   30 tablet   3    BP 104/69  Pulse 71  Temp(Src) 97.9 F (36.6 C) (Oral)  Ht 5\' 3"  (1.6 m)  Wt 256 lb 8 oz (116.348 kg)  BMI 45.45 kg/m2  SpO2 98%  LMP 05/06/2013 Physical Exam Nursing notes and Vital Signs reviewed. Appearance:  Patient appears stated age, and in no acute distress.  Patient is obese (BMI 45.5) Eyes:  Pupils are equal, round, and reactive to light and accomodation.  Extraocular movement is intact.  Conjunctivae are not inflamed  Ears:  Canals normal.  Tympanic membranes normal.  Nose:  Mildly congested turbinates.  No sinus tenderness.   Pharynx:  Normal Neck:  Supple.   Tender shotty left posterior nodes. Lungs:  Clear to auscultation.  Breath sounds are equal.  Heart:  Regular rate and rhythm without murmurs, rubs, or gallops.  Abdomen:  Nontender without masses or hepatosplenomegaly.  Bowel sounds are present.  No CVA or flank tenderness.  Extremities:  No edema.  No calf tenderness Skin:  No rash present.   ED Course  Procedures  none    Labs  Reviewed  POCT INFLUENZA A/B negative         MDM   1. Influenza-like illness; ?false negative flu test     Begin Tamiflu.  Prescription written for Benzonatate [facility]) to take at bedtime for night-time cough.  Take plain Mucinex (1200 mg guaifenesin) twice daily for cough and congestion.  May add Sudafed for sinus congestion.   Increase fluid intake, rest. May use Afrin nasal spray (or generic oxymetazoline) twice daily for about 5 days.  Also recommend using saline nasal spray several times daily and saline nasal irrigation (AYR is a common brand) Try warm salt water gargles for sore throat.  Stop all antihistamines for now, and other non-prescription cough/cold preparations. May take Ibuprofen 200mg , 4 tabs every 8 hours with food for joint pain, headache, etc. Follow-up with family doctor if not improving about 5 to 7 days.    Kandra Nicolas, MD 06/10/13 [phone]

## 2013-06-07 NOTE — Discharge Instructions (Signed)
Take plain Mucinex (1200 mg guaifenesin) twice daily for cough and congestion.  May add Sudafed for sinus congestion.   Increase fluid intake, rest. May use Afrin nasal spray (or generic oxymetazoline) twice daily for about 5 days.  Also recommend using saline nasal spray several times daily and saline nasal irrigation (AYR is a common brand) Try warm salt water gargles for sore throat.  Stop all antihistamines for now, and other non-prescription cough/cold preparations. May take Ibuprofen 200mg , 4 tabs every 8 hours with food for joint pain, headache, etc. Follow-up with family doctor if not improving about 5 to 7 days.

## 2013-06-07 NOTE — ED Notes (Signed)
Pt states she suddenly started feeling bad yesterday.  Pt experiencing cough that is sometimes productive with yellow and bloody mucous, hoarse voice, swollen glands, body aches, and congestion.

## 2013-06-09 ENCOUNTER — Telehealth: Payer: Self-pay

## 2013-06-09 MED ORDER — HYDROCOD POLST-CHLORPHEN POLST 10-8 MG/5ML PO LQCR: 5.0000 mL | Freq: Two times a day (BID) | ORAL | Status: DC | PRN

## 2013-06-09 NOTE — Telephone Encounter (Signed)
Prescription is in my box 

## 2013-06-09 NOTE — Telephone Encounter (Signed)
Patient has been notified that Rx for Tussinex was ready for pick up. Nevada Kirchner,CMA

## 2013-06-09 NOTE — Telephone Encounter (Signed)
Patient was seen in [facility] she was tested negative for Flu she says that her cough is worse and she needs a Rx for Tussinex. Precious Gilchrest,CMA

## 2013-06-15 ENCOUNTER — Ambulatory Visit: Payer: BC Managed Care – PPO | Admitting: Sports Medicine

## 2013-06-20 ENCOUNTER — Other Ambulatory Visit: Payer: Self-pay | Admitting: Sports Medicine

## 2013-06-22 ENCOUNTER — Ambulatory Visit (INDEPENDENT_AMBULATORY_CARE_PROVIDER_SITE_OTHER): Payer: BC Managed Care – PPO | Admitting: Sports Medicine

## 2013-06-22 ENCOUNTER — Encounter: Payer: Self-pay | Admitting: Sports Medicine

## 2013-06-22 VITALS — BP 123/77 | HR 85 | Ht 63.0 in | Wt 260.0 lb

## 2013-06-22 DIAGNOSIS — F4323 Adjustment disorder with mixed anxiety and depressed mood: Secondary | ICD-10-CM

## 2013-06-22 DIAGNOSIS — G43909 Migraine, unspecified, not intractable, without status migrainosus: Secondary | ICD-10-CM

## 2013-06-22 NOTE — Assessment & Plan Note (Signed)
Stable, FMLA forms filled out today for work.

## 2013-06-22 NOTE — Assessment & Plan Note (Signed)
Doing extremely well, she is really not use her Xanax, continues to use citalopram 30 mg daily.

## 2013-06-22 NOTE — Progress Notes (Signed)
  Subjective:    CC: Followup  HPI: Regina [name] returns for followup of recent increase in anxiety and depression due to an adjustment disorder on top of overlying major depression and anxiety disorder. I gave her a small amount of alprazolam, and increased her citalopram 30 mg daily. She does report that her symptoms have all resolved and she is very happy.  Migraines: Needs me to fill out FMLA paperwork for occasional migraines that she gets, that keep her out of work.  Past medical history, Surgical history, Family history not pertinant except as noted below, Social history, Allergies, and medications have been entered into the medical record, reviewed, and no changes needed.   Review of Systems: No fevers, chills, night sweats, weight loss, chest pain, or shortness of breath.   Objective:    General: Well Developed, well nourished, and in no acute distress.  Neuro: Alert and oriented x3, extra-ocular muscles intact, sensation grossly intact.  HEENT: Normocephalic, atraumatic, pupils equal round reactive to light, neck supple, no masses, no lymphadenopathy, thyroid nonpalpable.  Skin: Warm and dry, no rashes. Cardiac: Regular rate and rhythm, no murmurs rubs or gallops, no lower extremity edema.  Respiratory: Clear to auscultation bilaterally. Not using accessory muscles, speaking in full sentences.  Impression and Recommendations:

## 2013-08-04 ENCOUNTER — Ambulatory Visit (INDEPENDENT_AMBULATORY_CARE_PROVIDER_SITE_OTHER): Payer: BC Managed Care – PPO | Admitting: Nurse Practitioner

## 2013-08-04 ENCOUNTER — Encounter: Payer: Self-pay | Admitting: Nurse Practitioner

## 2013-08-04 ENCOUNTER — Other Ambulatory Visit: Payer: Self-pay | Admitting: Sports Medicine

## 2013-08-04 VITALS — BP 121/80 | HR 75 | Resp 16 | Ht 63.0 in | Wt 254.0 lb

## 2013-08-04 DIAGNOSIS — F419 Anxiety disorder, unspecified: Secondary | ICD-10-CM

## 2013-08-04 DIAGNOSIS — F32A Depression, unspecified: Secondary | ICD-10-CM

## 2013-08-04 DIAGNOSIS — F3289 Other specified depressive episodes: Secondary | ICD-10-CM

## 2013-08-04 DIAGNOSIS — G43009 Migraine without aura, not intractable, without status migrainosus: Secondary | ICD-10-CM

## 2013-08-04 DIAGNOSIS — F329 Major depressive disorder, single episode, unspecified: Secondary | ICD-10-CM

## 2013-08-04 DIAGNOSIS — F411 Generalized anxiety disorder: Secondary | ICD-10-CM

## 2013-08-04 DIAGNOSIS — G4700 Insomnia, unspecified: Secondary | ICD-10-CM

## 2013-08-04 MED ORDER — ONDANSETRON 8 MG PO TBDP: 8.0000 mg | ORAL_TABLET | Freq: Three times a day (TID) | ORAL | Status: DC | PRN

## 2013-08-04 MED ORDER — TOPIRAMATE 100 MG PO TABS: 100.0000 mg | ORAL_TABLET | Freq: Every day | ORAL | Status: DC

## 2013-08-04 MED ORDER — NAPROXEN SODIUM 550 MG PO TABS: 550.0000 mg | ORAL_TABLET | Freq: Two times a day (BID) | ORAL | Status: DC

## 2013-08-04 MED ORDER — RIZATRIPTAN BENZOATE 10 MG PO TABS: 10.0000 mg | ORAL_TABLET | ORAL | Status: DC | PRN

## 2013-08-04 NOTE — Patient Instructions (Signed)
Migraine Headache A migraine headache is an intense, throbbing pain on one or both sides of your head. A migraine can last for 30 minutes to several hours. CAUSES  The exact cause of a migraine headache is not always known. However, a migraine may be caused when nerves in the brain become irritated and release chemicals that cause inflammation. This causes pain. Certain things may also trigger migraines, such as:  Alcohol.  Smoking.  Stress.  Menstruation.  Aged cheeses.  Foods or drinks that contain nitrates, glutamate, aspartame, or tyramine.  Lack of sleep.  Chocolate.  Caffeine.  Hunger.  Physical exertion.  Fatigue.  Medicines used to treat chest pain (nitroglycerine), birth control pills, estrogen, and some blood pressure medicines. SIGNS AND SYMPTOMS  Pain on one or both sides of your head.  Pulsating or throbbing pain.  Severe pain that prevents daily activities.  Pain that is aggravated by any physical activity.  Nausea, vomiting, or both.  Dizziness.  Pain with exposure to bright lights, loud noises, or activity.  General sensitivity to bright lights, loud noises, or smells. Before you get a migraine, you may get warning signs that a migraine is coming (aura). An aura may include:  Seeing flashing lights.  Seeing bright spots, halos, or zig-zag lines.  Having tunnel vision or blurred vision.  Having feelings of numbness or tingling.  Having trouble talking.  Having muscle weakness. DIAGNOSIS  A migraine headache is often diagnosed based on:  Symptoms.  Physical exam.  A CT scan or MRI of your head. These imaging tests cannot diagnose migraines, but they can help rule out other causes of headaches. TREATMENT Medicines may be given for pain and nausea. Medicines can also be given to help prevent recurrent migraines.  HOME CARE INSTRUCTIONS  Only take over-the-counter or prescription medicines for pain or discomfort as directed by your  health care provider. The use of long-term narcotics is not recommended.  Lie down in a dark, quiet room when you have a migraine.  Keep a journal to find out what may trigger your migraine headaches. For example, write down:  What you eat and drink.  How much sleep you get.  Any change to your diet or medicines.  Limit alcohol consumption.  Quit smoking if you smoke.  Get 7 9 hours of sleep, or as recommended by your health care provider.  Limit stress.  Keep lights dim if bright lights bother you and make your migraines worse. SEEK IMMEDIATE MEDICAL CARE IF:   Your migraine becomes severe.  You have a fever.  You have a stiff neck.  You have vision loss.  You have muscular weakness or loss of muscle control.  You start losing your balance or have trouble walking.  You feel faint or pass out.  You have severe symptoms that are different from your first symptoms. MAKE SURE YOU:   Understand these instructions.  Will watch your condition.  Will get help right away if you are not doing well or get worse. Document Released: 04/16/2005 Document Revised: 02/04/2013 Document Reviewed: 12/22/2012 [employer] Patient Information 2014 [employer].  

## 2013-08-04 NOTE — Progress Notes (Signed)
Diagnosis: Migraine without Aura, Anxiety, Depression, Insomnia  History: Regina [name] ASA 38 y.o. [id number] comes to office today for migraine consultation. She has been seeing her PCP who has her on Topamax for migraine. Trazodone for insomnia and Celexa for anxiety/ depression. She has a history of migraines since her teens. Her Mother and Sister have migraines. She has a negative CT of head. Her main issue today is that she is missing work and her employer wants an Event organiser on file. She is a Licensed conveyancer, happily married with 3 daughters that are 71,37,51 years old. Her main trigger is her menses and she was told by her Cardiologist that she should no use any birth control due to teratoma. She is currently taking about #30 Ultram per month for migraine management. She has a side effect to Imitrex which was not a true allergy. She is willing to trial another Triptan.   Location: Bilateral temples and occipital regions  Number of Headache days/month: Severe: 1 that lasts 1-2 days Moderate: 3 Mild: 4  Current Outpatient Prescriptions on File Prior to Visit  Medication Sig Dispense Refill  . ALPRAZolam (XANAX) 1 MG tablet One half to one tablet up to 3 times a day as needed for anxiety/panic.  30 tablet  0  . citalopram (CELEXA) 20 MG tablet Take 1.5 tablets (30 mg total) by mouth daily.  135 tablet  1  . fexofenadine-pseudoephedrine (ALLEGRA-D 24) 180-240 MG per 24 hr tablet Take 1 tablet by mouth daily.      . Multiple Vitamin (MULTIVITAMIN) tablet Take 1 tablet by mouth daily.      . traMADol (ULTRAM) 50 MG tablet 1-2 tabs by mouth Q8 hours, maximum 6 tabs per day.  90 tablet  0   No current facility-administered medications on file prior to visit.    Acute / prevention: Xanax, Celexa, Topamax, Ultram, Trazodone  Past Medical History  Diagnosis Date  . Migraine   . Depression with anxiety   . Panic disorder   . Morbid obesity    Past Surgical History  Procedure Laterality Date  . Soft tissue  tumor resection  06/17/07    chest/ wound reclosure 4/09'  . Dilation and curettage, diagnostic / therapeutic    . 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 Hypertension Mother   . Migraines Mother   . Depression Father   . Suicidality Father    Social History:  reports that she has never smoked. She does not have any smokeless tobacco history on file. She reports that she does not drink alcohol or use illicit drugs. Allergies:  Allergies  Allergen Reactions  . Codeine Itching  . Sumatriptan     Palpitations    Triggers: menses, stress, weather changes  Birth control: condoms 100%  ROS: positive for migraine without aura, anxiety, depression, insomnia, obesity  Exam: Well developed, well nourished obese caucasian female  General: NAD HEENT: Negative Cardiac:RRR Lungs: clear Neuro:negative Skin: warm and dry/ cardiac scar  Impression: Migraine without aura Anxiety Depression Insomnia  Plan : Discussed the pathophysiology of migraine and medications used. We will increase her Topamax to 100 mg. Add Maxalt, Anaprox and zofran for acute migraine. Advised to reduce the amount Ultram. She is advised to see GYN for advise concerning progesterone only for birth control and to manage menses: Suggest Ceylon IUD. Will fill out FMLA papers. She will return in 6 weeks for follow up   Time  Spent: 45 minutes

## 2013-08-05 ENCOUNTER — Encounter: Payer: Self-pay | Admitting: Sports Medicine

## 2013-08-05 ENCOUNTER — Ambulatory Visit (INDEPENDENT_AMBULATORY_CARE_PROVIDER_SITE_OTHER): Payer: BC Managed Care – PPO | Admitting: Sports Medicine

## 2013-08-05 VITALS — BP 132/85 | HR 83 | Ht 63.0 in | Wt 256.0 lb

## 2013-08-05 DIAGNOSIS — E669 Obesity, unspecified: Secondary | ICD-10-CM

## 2013-08-05 DIAGNOSIS — G43009 Migraine without aura, not intractable, without status migrainosus: Secondary | ICD-10-CM

## 2013-08-05 MED ORDER — PHENTERMINE HCL 37.5 MG PO CAPS: 37.5000 mg | ORAL_CAPSULE | ORAL | Status: DC

## 2013-08-05 MED ORDER — TRAMADOL HCL 50 MG PO TABS: ORAL_TABLET | ORAL | Status: DC

## 2013-08-05 NOTE — Assessment & Plan Note (Signed)
She did see our migraine specialist. Increased Topamax 100 mg and added Maxalt. I'm going to refill her tramadol per her request. She only uses approximately one per day.

## 2013-08-05 NOTE — Assessment & Plan Note (Signed)
Nutrition referral, phentermine, return monthly for weight checks and refills.

## 2013-08-05 NOTE — Progress Notes (Signed)
  Subjective:    CC: Followup  HPI: Migraines: Recently saw the migraine specialist, Topamax was increased to 100 mg, and she was started on Relpax. She continues to use tramadol on an as-needed basis, needs a refill.  Obesity: Desires to start pathway of weight-loss treatments.  Past medical history, Surgical history, Family history not pertinant except as noted below, Social history, Allergies, and medications have been entered into the medical record, reviewed, and no changes needed.   Review of Systems: No fevers, chills, night sweats, weight loss, chest pain, or shortness of breath.   Objective:    General: Well Developed, well nourished, and in no acute distress.  Neuro: Alert and oriented x3, extra-ocular muscles intact, sensation grossly intact.  HEENT: Normocephalic, atraumatic, pupils equal round reactive to light, neck supple, no masses, no lymphadenopathy, thyroid nonpalpable.  Skin: Warm and dry, no rashes. Cardiac: Regular rate and rhythm, no murmurs rubs or gallops, no lower extremity edema.  Respiratory: Clear to auscultation bilaterally. Not using accessory muscles, speaking in full sentences.  Impression and Recommendations:

## 2013-08-06 ENCOUNTER — Encounter: Payer: Self-pay | Admitting: *Deleted

## 2013-09-01 ENCOUNTER — Encounter: Payer: BC Managed Care – PPO | Admitting: Nurse Practitioner

## 2013-09-01 ENCOUNTER — Ambulatory Visit: Payer: BC Managed Care – PPO | Admitting: Sports Medicine

## 2013-09-14 ENCOUNTER — Other Ambulatory Visit: Payer: Self-pay | Admitting: Sports Medicine

## 2013-09-14 MED ORDER — HYDROCOD POLST-CHLORPHEN POLST 10-8 MG/5ML PO LQCR: 5.0000 mL | Freq: Two times a day (BID) | ORAL | Status: DC | PRN

## 2013-09-14 NOTE — Telephone Encounter (Signed)
Current URI with cough. Adding Tussionex. Return if no better in a couple of weeks.

## 2013-09-17 ENCOUNTER — Encounter: Payer: Self-pay | Admitting: Family Medicine

## 2013-09-17 ENCOUNTER — Ambulatory Visit (INDEPENDENT_AMBULATORY_CARE_PROVIDER_SITE_OTHER): Payer: BC Managed Care – PPO | Admitting: Family Medicine

## 2013-09-17 VITALS — BP 102/88 | Temp 98.6°F | Wt 246.0 lb

## 2013-09-17 DIAGNOSIS — J45909 Unspecified asthma, uncomplicated: Secondary | ICD-10-CM

## 2013-09-17 MED ORDER — DOXYCYCLINE HYCLATE 100 MG PO TABS: 100.0000 mg | ORAL_TABLET | Freq: Two times a day (BID) | ORAL | Status: DC

## 2013-09-17 MED ORDER — PREDNISONE 20 MG PO TABS: ORAL_TABLET | ORAL | Status: AC

## 2013-09-17 NOTE — Progress Notes (Signed)
CC: Regina [name] is a 38 y.o. female is here for cough since last friday   Subjective: HPI:  Complains of nonproductive cough that has been present for the last 6-7 days. Seems to be worsening on daily basis. Accompanied by shortness of breath with mild activities, wheezing, and a sore throat which began however this is now resolved. Symptoms are significantly improved with prescription strength cough medication, mildly improved with NyQuil, no benefit from Allegra. Symptoms present all hours of the day and significantly interfere with sleep. She reports subjective fevers and chills but no objective fever. Accompanied by nasal congestion, muffled hearing, frontal sinus pressure. Denies confusion, chest pain, back pain, motor or sensory disturbances nor rashes.   Review Of Systems Outlined In HPI  Past Medical History  Diagnosis Date  . Migraine   . Depression with anxiety   . Panic disorder   . Morbid obesity     Past Surgical History  Procedure Laterality Date  . Soft tissue tumor resection  06/17/07    chest/ wound reclosure 4/09'  . Dilation and curettage, diagnostic / therapeutic    . 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 Hypertension Mother   . Migraines Mother   . Depression Father   . Suicidality Father     History   Social History  . Marital Status: Married    Spouse Name: N/A    Number of Children: N/A  . Years of Education: N/A   Occupational History  . Not on file.   Social History Main Topics  . Smoking status: Never Smoker   . Smokeless tobacco: Not on file  . Alcohol Use: No  . Drug Use: No  . Sexual Activity: Not on file   Other Topics Concern  . Not on file   Social History Narrative  . No narrative on file     Objective: BP 102/88  Temp(Src) 98.6 F (37 C) (Oral)  Wt 246 lb (111.585 kg)  SpO2 99%  PF 200 L/min  General: Alert and Oriented, No Acute Distress HEENT: Pupils equal, round,  reactive to light. Conjunctivae clear.  External ears unremarkable, canals clear with intact TMs with appropriate landmarks.  Middle ear appears to have a serous effusion bilaterally . Pink inferior turbinates.  Moist mucous membranes, pharynx without inflammation nor lesions.  Neck supple without palpable lymphadenopathy nor abnormal masses. Lungs:  distant breath sounds with trace end expiratory wheezing, no rhonchi no rales nor signs of consolidation Cardiac: Regular rate and rhythm. Normal S1/S2.  No murmurs, rubs, nor gallops.   Mental Status: No depression, anxiety, nor agitation. Skin: Warm and dry.  Assessment & Plan: Regina [name] was seen today for cough since last friday.  Diagnoses and associated orders for this visit:  Reactive airway disease - doxycycline (VIBRA-TABS) 100 MG tablet; Take 1 tablet (100 mg total) by mouth 2 (two) times daily. - predniSONE (DELTASONE) 20 MG tablet; Three tabs at once daily for five days.    Reactive airway disease causing cough therefore start doxycycline and prednisone. She would prefer not to use albuterol.Signs and symptoms requring emergent/urgent reevaluation were discussed with the patient. She believes she has enough cough syrup to last through the weekend   Return if symptoms worsen or fail to improve.

## 2013-10-12 ENCOUNTER — Encounter: Payer: Self-pay | Admitting: Sports Medicine

## 2013-10-12 ENCOUNTER — Ambulatory Visit (INDEPENDENT_AMBULATORY_CARE_PROVIDER_SITE_OTHER): Payer: BC Managed Care – PPO | Admitting: Sports Medicine

## 2013-10-12 VITALS — BP 146/96 | HR 87 | Ht 63.0 in | Wt 242.0 lb

## 2013-10-12 DIAGNOSIS — E669 Obesity, unspecified: Secondary | ICD-10-CM

## 2013-10-12 DIAGNOSIS — F4323 Adjustment disorder with mixed anxiety and depressed mood: Secondary | ICD-10-CM

## 2013-10-12 DIAGNOSIS — G43009 Migraine without aura, not intractable, without status migrainosus: Secondary | ICD-10-CM

## 2013-10-12 MED ORDER — PHENTERMINE HCL 37.5 MG PO TABS: ORAL_TABLET | ORAL | Status: DC

## 2013-10-12 MED ORDER — TRAMADOL HCL 50 MG PO TABS: ORAL_TABLET | ORAL | Status: DC

## 2013-10-12 MED ORDER — TRAZODONE HCL 150 MG PO TABS: 150.0000 mg | ORAL_TABLET | Freq: Every day | ORAL | Status: DC

## 2013-10-12 NOTE — Progress Notes (Signed)
  Subjective:    CC: Followup  HPI:  Obesity: [name] unfortunately stop her phentermine as she felt like it was making her more hungry. She has lost over 10 pounds since the last visit.  Anxiety: Has worsened over the past several weeks, she does have the stress of her husband's upcoming spinal surgery. She also tells me that she ran out of trazodone, and this has made it difficult for her to sleep, she also coincides her increased anxiety with running out of the trazodone. Mood is okay, no suicidal or homicidal ideation.  Past medical history, Surgical history, Family history not pertinant except as noted below, Social history, Allergies, and medications have been entered into the medical record, reviewed, and no changes needed.   Review of Systems: No fevers, chills, night sweats, weight loss, chest pain, or shortness of breath.   Objective:    General: Well Developed, well nourished, and in no acute distress.  Neuro: Alert and oriented x3, extra-ocular muscles intact, sensation grossly intact.  HEENT: Normocephalic, atraumatic, pupils equal round reactive to light, neck supple, no masses, no lymphadenopathy, thyroid nonpalpable.  Skin: Warm and dry, no rashes. Cardiac: Regular rate and rhythm, no murmurs rubs or gallops, no lower extremity edema.  Respiratory: Clear to auscultation bilaterally. Not using accessory muscles, speaking in full sentences.  Impression and Recommendations:

## 2013-10-12 NOTE — Assessment & Plan Note (Signed)
We are going to switch phentermine to one half tab twice a day during Return in a month for weight check.

## 2013-10-12 NOTE — Assessment & Plan Note (Signed)
Refilling tramadol

## 2013-10-12 NOTE — Assessment & Plan Note (Signed)
Unfortunately recently discontinued her trazodone. She's now had worsening insomnia and anxiety. Refill trazodone.

## 2013-11-09 ENCOUNTER — Encounter: Payer: Self-pay | Admitting: Sports Medicine

## 2013-11-09 ENCOUNTER — Ambulatory Visit (INDEPENDENT_AMBULATORY_CARE_PROVIDER_SITE_OTHER): Payer: BC Managed Care – PPO | Admitting: Sports Medicine

## 2013-11-09 VITALS — BP 110/68 | HR 84 | Wt 241.0 lb

## 2013-11-09 DIAGNOSIS — E669 Obesity, unspecified: Secondary | ICD-10-CM

## 2013-11-09 MED ORDER — PHENTERMINE HCL 37.5 MG PO TABS: ORAL_TABLET | ORAL | Status: DC

## 2013-11-09 NOTE — Progress Notes (Signed)
  Subjective:    CC: Followup  HPI: Obesity: Has lost 1 pound since last visit, she is currently doing one full tablet a day of phentermine, but does endorse multiple dietary indiscretions over the last month. No side effects.  Past medical history, Surgical history, Family history not pertinant except as noted below, Social history, Allergies, and medications have been entered into the medical record, reviewed, and no changes needed.   Review of Systems: No fevers, chills, night sweats, weight loss, chest pain, or shortness of breath.   Objective:    General: Well Developed, well nourished, and in no acute distress.  Neuro: Alert and oriented x3, extra-ocular muscles intact, sensation grossly intact.  HEENT: Normocephalic, atraumatic, pupils equal round reactive to light, neck supple, no masses, no lymphadenopathy, thyroid nonpalpable.  Skin: Warm and dry, no rashes. Cardiac: Regular rate and rhythm, no murmurs rubs or gallops, no lower extremity edema.  Respiratory: Clear to auscultation bilaterally. Not using accessory muscles, speaking in full sentences.  Impression and Recommendations:

## 2013-11-09 NOTE — Assessment & Plan Note (Signed)
1 pound weight loss but attributes her minimal weight loss on dietary indiscretions from her recent trip. Refill phentermine. Return in a month.

## 2013-12-01 ENCOUNTER — Ambulatory Visit (INDEPENDENT_AMBULATORY_CARE_PROVIDER_SITE_OTHER): Payer: BC Managed Care – PPO | Admitting: Nurse Practitioner

## 2013-12-01 ENCOUNTER — Encounter: Payer: Self-pay | Admitting: Nurse Practitioner

## 2013-12-01 VITALS — BP 137/89 | HR 99 | Resp 16 | Ht 62.5 in | Wt 232.0 lb

## 2013-12-01 DIAGNOSIS — F4323 Adjustment disorder with mixed anxiety and depressed mood: Secondary | ICD-10-CM

## 2013-12-01 DIAGNOSIS — G43009 Migraine without aura, not intractable, without status migrainosus: Secondary | ICD-10-CM | POA: Diagnosis not present

## 2013-12-01 DIAGNOSIS — G4700 Insomnia, unspecified: Secondary | ICD-10-CM | POA: Diagnosis not present

## 2013-12-01 DIAGNOSIS — E669 Obesity, unspecified: Secondary | ICD-10-CM | POA: Diagnosis not present

## 2013-12-01 MED ORDER — TOPIRAMATE 200 MG PO TABS: 200.0000 mg | ORAL_TABLET | Freq: Every day | ORAL | Status: DC

## 2013-12-01 NOTE — Patient Instructions (Signed)

## 2013-12-01 NOTE — Progress Notes (Signed)
History:  Regina [name] is a 38 y.o. [phone] who presents to [facility] clinic today for follow up with her migraines. She was last seen in April and did not keep her 6 weeks follow up appointment. She has determined that her menses is not the trigger for her migraines but that it is more stress related. She has been out of school for summer and that has helped migraines and stress. She has used the Maxalt and it works well for her. She would like to increase Topamax if possible. She is continuing to lose weight and now has lost 52 lbs. Her MD has her on Phentermine.   The following portions of the patient's history were reviewed and updated as appropriate: allergies, current medications, past family history, past medical history, past social history, past surgical history and problem list.  Review of Systems:    Objective:  Physical Exam BP 137/89  Pulse 99  Resp 16  Ht 5' 2.5" (1.588 m)  Wt 232 lb (105.235 kg)  BMI 41.73 kg/m2  LMP 11/10/2013 GENERAL: Well-developed, well-nourished female in no acute distress. Obese HEENT: Normocephalic, atraumatic.  NECK: Supple. Normal thyroid.  LUNGS: Normal rate. Clear to auscultation bilaterally.  HEART: Regular rate and rhythm with no adventitious sounds.  EXTREMITIES: No cyanosis, clubbing, or edema, 2+ distal pulses.   Labs and Imaging No results found.  Assessment & Plan:  Assessment:  Migraine without Aura Insomnia Anxiety  Depression  Plans: Will increase Topamax to 200 mg qhs Continue with Celexa and trazodone Reviewed acute medications and how they are to be used to avoid missing work Follow up 6 months  Olegario Messier, NP 12/01/2013 9:32 AM

## 2013-12-07 ENCOUNTER — Ambulatory Visit: Payer: BC Managed Care – PPO | Admitting: Sports Medicine

## 2013-12-08 ENCOUNTER — Ambulatory Visit: Payer: BC Managed Care – PPO | Admitting: Sports Medicine

## 2013-12-18 ENCOUNTER — Other Ambulatory Visit: Payer: Self-pay | Admitting: Sports Medicine

## 2013-12-21 ENCOUNTER — Ambulatory Visit: Payer: BC Managed Care – PPO | Admitting: Sports Medicine

## 2013-12-25 ENCOUNTER — Encounter: Payer: Self-pay | Admitting: Sports Medicine

## 2013-12-25 ENCOUNTER — Ambulatory Visit (INDEPENDENT_AMBULATORY_CARE_PROVIDER_SITE_OTHER): Payer: BC Managed Care – PPO | Admitting: Sports Medicine

## 2013-12-25 VITALS — BP 133/90 | HR 72 | Ht 62.0 in | Wt 234.0 lb

## 2013-12-25 DIAGNOSIS — E669 Obesity, unspecified: Secondary | ICD-10-CM

## 2013-12-25 MED ORDER — PHENTERMINE HCL 37.5 MG PO TABS: ORAL_TABLET | ORAL | Status: DC

## 2013-12-25 NOTE — Progress Notes (Signed)
  Subjective:    CC: Weight check  HPI: Obesity: Continues to lose weight on phentermine.  Depression: Well controlled.  Migraines: controlled, Topamax was increased.  Past medical history, Surgical history, Family history not pertinant except as noted below, Social history, Allergies, and medications have been entered into the medical record, reviewed, and no changes needed.   Review of Systems: No fevers, chills, night sweats, weight loss, chest pain, or shortness of breath.   Objective:    General: Well Developed, well nourished, and in no acute distress.  Neuro: Alert and oriented x3, extra-ocular muscles intact, sensation grossly intact.  HEENT: Normocephalic, atraumatic, pupils equal round reactive to light, neck supple, no masses, no lymphadenopathy, thyroid nonpalpable.  Skin: Warm and dry, no rashes. Cardiac: Regular rate and rhythm, no murmurs rubs or gallops, no lower extremity edema.  Respiratory: Clear to auscultation bilaterally. Not using accessory muscles, speaking in full sentences.  Impression and Recommendations:

## 2013-12-25 NOTE — Assessment & Plan Note (Signed)
Refilling phentermine. Good weight loss. Take it at 11:00. Return in a month for a weight check and refills.

## 2014-01-22 ENCOUNTER — Ambulatory Visit: Payer: BC Managed Care – PPO | Admitting: Sports Medicine

## 2014-02-05 ENCOUNTER — Ambulatory Visit: Payer: BC Managed Care – PPO | Admitting: Sports Medicine

## 2014-03-01 ENCOUNTER — Encounter: Payer: Self-pay | Admitting: Sports Medicine

## 2014-04-02 ENCOUNTER — Ambulatory Visit (INDEPENDENT_AMBULATORY_CARE_PROVIDER_SITE_OTHER): Payer: BC Managed Care – PPO | Admitting: Sports Medicine

## 2014-04-02 ENCOUNTER — Encounter: Payer: Self-pay | Admitting: Sports Medicine

## 2014-04-02 VITALS — BP 120/75 | HR 78 | Ht 63.0 in | Wt 241.0 lb

## 2014-04-02 DIAGNOSIS — M5412 Radiculopathy, cervical region: Secondary | ICD-10-CM

## 2014-04-02 DIAGNOSIS — G43009 Migraine without aura, not intractable, without status migrainosus: Secondary | ICD-10-CM

## 2014-04-02 MED ORDER — DEXAMETHASONE 4 MG PO TABS: 4.0000 mg | ORAL_TABLET | Freq: Two times a day (BID) | ORAL | Status: DC

## 2014-04-02 MED ORDER — CYCLOBENZAPRINE HCL 10 MG PO TABS: ORAL_TABLET | ORAL | Status: DC

## 2014-04-02 MED ORDER — KETOROLAC TROMETHAMINE 30 MG/ML IJ SOLN
30.0000 mg | Freq: Once | INTRAMUSCULAR | Status: AC
  Administered 2014-04-02: 30 mg via INTRAMUSCULAR

## 2014-04-02 NOTE — Assessment & Plan Note (Signed)
Continue Topamax. Toradol 30 intramuscular. Continue to use Maxalt as needed. Decadron. Return if no improvement.

## 2014-04-02 NOTE — Progress Notes (Signed)
  Subjective:    CC: sided cervical pain  HPI: This is a pleasant 38 year old female, for the past several weeks she's had increasing pain in the left side of her neck with radiation down the posterior aspect of her left arm to the second and third fingers. Moderate, persistent and worse with turning the neck to the left side. No trauma, no constitutional symptoms, no lower extremity symptoms.  Migraine: Is starting to have a migraine headache as before, she currently takes 200 mg of Topamax daily which has been effective at preventing migraines, she has already tried Maxalt without any improvement. Symptoms are moderate, persistent, no visual changes, no nausea. She does have photophobia and phonophobia.  Past medical history, Surgical history, Family history not pertinant except as noted below, Social history, Allergies, and medications have been entered into the medical record, reviewed, and no changes needed.   Review of Systems: No fevers, chills, night sweats, weight loss, chest pain, or shortness of breath.   Objective:    General: Well Developed, well nourished, and in no acute distress.  Neuro: Alert and oriented x3, extra-ocular muscles intact, sensation grossly intact. Cranial nerves II through XII are intact, motor, sensory and coordinative functions are all intact. HEENT: Normocephalic, atraumatic, pupils equal round reactive to light, neck supple, no masses, no lymphadenopathy, thyroid nonpalpable.  Skin: Warm and dry, no rashes. Cardiac: Regular rate and rhythm, no murmurs rubs or gallops, no lower extremity edema.  Respiratory: Clear to auscultation bilaterally. Not using accessory muscles, speaking in full sentences. Neck: Negative spurling's Full neck range of motion Grip strength and sensation normal in bilateral hands Strength good C4 to T1 distribution No sensory change to C4 to T1 Reflexes somewhat brisk on the left side.  Negative Hoffmann sign  bilaterally.  Impression and Recommendations:

## 2014-04-02 NOTE — Assessment & Plan Note (Signed)
Left C7. Single session of physical therapy, x-rays, Flexeril, Decadron. Return to see me in one month, MRI for intervention if no better.

## 2014-04-16 ENCOUNTER — Ambulatory Visit: Payer: BC Managed Care – PPO | Admitting: Physical Therapy

## 2014-05-03 ENCOUNTER — Ambulatory Visit: Payer: BC Managed Care – PPO | Admitting: Sports Medicine

## 2014-05-20 ENCOUNTER — Encounter: Payer: Self-pay | Admitting: Sports Medicine

## 2014-05-20 ENCOUNTER — Ambulatory Visit (INDEPENDENT_AMBULATORY_CARE_PROVIDER_SITE_OTHER): Payer: BC Managed Care – PPO | Admitting: Sports Medicine

## 2014-05-20 VITALS — BP 147/81 | HR 98 | Ht 62.0 in | Wt 236.0 lb

## 2014-05-20 DIAGNOSIS — F332 Major depressive disorder, recurrent severe without psychotic features: Secondary | ICD-10-CM

## 2014-05-20 DIAGNOSIS — G43009 Migraine without aura, not intractable, without status migrainosus: Secondary | ICD-10-CM

## 2014-05-20 MED ORDER — VENLAFAXINE HCL ER 37.5 MG PO CP24: ORAL_CAPSULE | ORAL | Status: DC

## 2014-05-20 MED ORDER — TRAMADOL HCL 50 MG PO TABS: ORAL_TABLET | ORAL | Status: DC

## 2014-05-20 MED ORDER — VENLAFAXINE HCL ER 75 MG PO CP24: 75.0000 mg | ORAL_CAPSULE | Freq: Every day | ORAL | Status: DC

## 2014-05-20 NOTE — Progress Notes (Signed)
  Subjective:    CC: Worsening depression  HPI: This is a pleasant 39 year old female, she has migraines, major depression. For the past couple of months she's noted worsening depressive symptoms including change in her sleep, mood, energy levels, concentration. She stating citalopram 20 mg daily regularly, and denies suicidal or homicidal ideation. She did one refill doesn't disability paperwork today for her migraines. It sounds as though she became flustered and quit her job, and is now regretting it.  Past medical history, Surgical history, Family history not pertinant except as noted below, Social history, Allergies, and medications have been entered into the medical record, reviewed, and no changes needed.   Review of Systems: No fevers, chills, night sweats, weight loss, chest pain, or shortness of breath.   Objective:    General: Well Developed, well nourished, and in no acute distress. Tearful. Neuro: Alert and oriented x3, extra-ocular muscles intact, sensation grossly intact.  HEENT: Normocephalic, atraumatic, pupils equal round reactive to light, neck supple, no masses, no lymphadenopathy, thyroid nonpalpable.  Skin: Warm and dry, no rashes. Cardiac: Regular rate and rhythm, no murmurs rubs or gallops, no lower extremity edema.  Respiratory: Clear to auscultation bilaterally. Not using accessory muscles, speaking in full sentences.  Impression and Recommendations:

## 2014-05-20 NOTE — Assessment & Plan Note (Signed)
Looking into disability for migraines, I don't think this is a realistic plan however I would like her to touch base with her migraine specialist for further details.

## 2014-05-20 NOTE — Assessment & Plan Note (Signed)
Currently in a flare. Down taper citalopram, switching to Effexor. Referral to psychiatry.

## 2014-06-01 ENCOUNTER — Encounter: Payer: BC Managed Care – PPO | Admitting: Nurse Practitioner

## 2014-06-07 ENCOUNTER — Ambulatory Visit (INDEPENDENT_AMBULATORY_CARE_PROVIDER_SITE_OTHER): Payer: BC Managed Care – PPO | Admitting: Family Medicine

## 2014-06-07 ENCOUNTER — Encounter: Payer: Self-pay | Admitting: Family Medicine

## 2014-06-07 VITALS — BP 144/82 | HR 96 | Wt 240.0 lb

## 2014-06-07 DIAGNOSIS — L299 Pruritus, unspecified: Secondary | ICD-10-CM | POA: Diagnosis not present

## 2014-06-07 MED ORDER — HYDROXYZINE HCL 50 MG PO TABS: 50.0000 mg | ORAL_TABLET | Freq: Three times a day (TID) | ORAL | Status: DC | PRN

## 2014-06-07 NOTE — Progress Notes (Signed)
CC: Regina [name] is a 39 y.o. female is here for Rash   Subjective: HPI:  Redness and itching that began on Friday which is localized to the chest and over the week and has spread to involve the back and both proximal appendages. It is slightly improved with Benadryl, no benefit from Mellen. It's constantly itching. It is moderate in severity. Nothing else seems to make it better or worse. Seems to have plateaued in severity since Sunday. She denies any other symptoms that have joined the rash. She denies wheezing, flushing, shortness of breath, angioedema, fevers, diarrhea or pain. Her husband sleeps in the same bed as her and has had no rash whatsoever. She denies any change in personal care products or food.   Review Of Systems Outlined In HPI  Past Medical History  Diagnosis Date  . Migraine   . Depression with anxiety   . Panic disorder   . Morbid obesity     Past Surgical History  Procedure Laterality Date  . Soft tissue tumor resection  06/17/07    chest/ wound reclosure 4/09'  . Dilation and curettage, diagnostic / therapeutic    . 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 Hypertension Mother   . Migraines Mother   . Depression Father   . Suicidality Father     History   Social History  . Marital Status: Married    Spouse Name: N/A    Number of Children: N/A  . Years of Education: N/A   Occupational History  . Not on file.   Social History Main Topics  . Smoking status: Never Smoker   . Smokeless tobacco: Not on file  . Alcohol Use: No  . Drug Use: No  . Sexual Activity: Not on file   Other Topics Concern  . Not on file   Social History Narrative     Objective: BP 144/82 mmHg  Pulse 96  Wt 240 lb (108.863 kg)  General: Alert and Oriented, No Acute Distress HEENT: Pupils equal, round, reactive to light. Conjunctivae clear.  Moist mucous membranes pharynx unremarkable Lungs: Clear to auscultation  bilaterally, no wheezing/ronchi/rales.  Comfortable work of breathing. Good air movement. Cardiac: Regular rate and rhythm. Normal S1/S2.  No murmurs, rubs, nor gallops.   Extremities: No peripheral edema.  Strong peripheral pulses.  Mental Status: No depression, anxiety, nor agitation. Skin: Warm and dry. Maculopapular rash faintly erythematous involving the back and chest mostly but also on the bilateral upper extremities. Sparing the face  Assessment & Plan: Regina [name] was seen today for rash.  Diagnoses and associated orders for this visit:  Itching - hydrOXYzine (ATARAX/VISTARIL) 50 MG tablet; Take 1-2 tablets (50-100 mg total) by mouth 3 (three) times daily as needed.    Suspect itching and rash is due to a mild allergic reaction. No suspicion for scabies or bedbugs, she recently purchased a new mattress and husband has no symptoms. Offered prednisone however she tells me side effects on prednisone are worse than the itching itself, will try to control symptoms alone with hydroxyzine, Sedation warning provided  Return if symptoms worsen or fail to improve.

## 2014-06-14 ENCOUNTER — Other Ambulatory Visit: Payer: Self-pay | Admitting: Sports Medicine

## 2014-06-21 ENCOUNTER — Ambulatory Visit (INDEPENDENT_AMBULATORY_CARE_PROVIDER_SITE_OTHER): Payer: BC Managed Care – PPO | Admitting: Sports Medicine

## 2014-06-21 ENCOUNTER — Encounter: Payer: Self-pay | Admitting: Sports Medicine

## 2014-06-21 VITALS — BP 139/72 | HR 100 | Ht 63.0 in | Wt 236.0 lb

## 2014-06-21 DIAGNOSIS — Z111 Encounter for screening for respiratory tuberculosis: Secondary | ICD-10-CM | POA: Diagnosis not present

## 2014-06-21 DIAGNOSIS — E785 Hyperlipidemia, unspecified: Secondary | ICD-10-CM | POA: Diagnosis not present

## 2014-06-21 DIAGNOSIS — Z299 Encounter for prophylactic measures, unspecified: Secondary | ICD-10-CM

## 2014-06-21 MED ORDER — TUBERCULIN PPD 5 UNIT/0.1ML ID SOLN
5.0000 [IU] | Freq: Once | INTRADERMAL | Status: DC
  Administered 2014-06-21: 5 [IU] via INTRADERMAL

## 2014-06-21 NOTE — Assessment & Plan Note (Signed)
Checking routine bloodwork. 

## 2014-06-21 NOTE — Progress Notes (Signed)
  Subjective:    CC: Follow-up  HPI: Regina [name] is here for a physical however she has switched jobs, and feels significantly better with regards to her stress and anxiety. 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  Past medical history, Surgical history, Family history not pertinant except as noted below, Social history, Allergies, and medications have been entered into the medical record, reviewed, and no changes needed.   Review of Systems: No fevers, chills, night sweats, weight loss, chest pain, or shortness of breath.   Objective:    General: Well Developed, well nourished, and in no acute distress.  Neuro: Alert and oriented x3, extra-ocular muscles intact, sensation grossly intact.  HEENT: Normocephalic, atraumatic, pupils equal round reactive to light, neck supple, no masses, no lymphadenopathy, thyroid nonpalpable.  Skin: Warm and dry, no rashes. Cardiac: Regular rate and rhythm, no murmurs rubs or gallops, no lower extremity edema.  Respiratory: Clear to auscultation bilaterally. Not using accessory muscles, speaking in full sentences.  Impression and Recommendations:

## 2014-06-21 NOTE — Assessment & Plan Note (Signed)
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 We also planted a PPD, she will return on Wednesday to have this read.

## 2014-06-23 LAB — TB SKIN TEST
Induration: 0 mm
TB Skin Test: NEGATIVE

## 2014-06-23 NOTE — Addendum Note (Signed)
Addended by: Narda Rutherford on: 06/23/2014 03:30 PM   Modules accepted: Orders

## 2014-06-24 ENCOUNTER — Other Ambulatory Visit: Payer: Self-pay | Admitting: Sports Medicine

## 2014-06-24 ENCOUNTER — Ambulatory Visit: Payer: BC Managed Care – PPO | Admitting: Sports Medicine

## 2014-06-29 ENCOUNTER — Encounter: Payer: BC Managed Care – PPO | Admitting: Nurse Practitioner

## 2014-06-30 ENCOUNTER — Ambulatory Visit (HOSPITAL_COMMUNITY): Payer: BC Managed Care – PPO | Admitting: Physician Assistant

## 2014-08-01 ENCOUNTER — Other Ambulatory Visit: Payer: Self-pay | Admitting: Sports Medicine

## 2014-09-03 ENCOUNTER — Other Ambulatory Visit: Payer: Self-pay | Admitting: Sports Medicine

## 2014-09-09 ENCOUNTER — Other Ambulatory Visit: Payer: Self-pay | Admitting: Sports Medicine

## 2014-10-01 ENCOUNTER — Other Ambulatory Visit: Payer: Self-pay | Admitting: Sports Medicine

## 2014-10-26 LAB — CBC
HCT: 38.7 % (ref 36.0–46.0)
Hemoglobin: 12.8 g/dL (ref 12.0–15.0)
MCH: 28.1 pg (ref 26.0–34.0)
MCHC: 33.1 g/dL (ref 30.0–36.0)
MCV: 85.1 fL (ref 78.0–100.0)
MPV: 8.4 fL — ABNORMAL LOW (ref 8.6–12.4)
Platelets: 326 10*3/uL (ref 150–400)
RBC: 4.55 MIL/uL (ref 3.87–5.11)
RDW: 13.8 % (ref 11.5–15.5)
WBC: 6.3 K/uL (ref 4.0–10.5)

## 2014-10-27 LAB — COMPREHENSIVE METABOLIC PANEL
Albumin: 4 g/dL (ref 3.5–5.2)
Alkaline Phosphatase: 67 U/L (ref 39–117)
CO2: 23 mEq/L (ref 19–32)
Calcium: 9 mg/dL (ref 8.4–10.5)
Total Protein: 6.7 g/dL (ref 6.0–8.3)

## 2014-10-27 LAB — COMPREHENSIVE METABOLIC PANEL WITH GFR
ALT: 10 U/L (ref 0–35)
AST: 11 U/L (ref 0–37)
BUN: 13 mg/dL (ref 6–23)
Chloride: 109 meq/L (ref 96–112)
Creat: 0.86 mg/dL (ref 0.50–1.10)
Glucose, Bld: 93 mg/dL (ref 70–99)
Potassium: 4.1 meq/L (ref 3.5–5.3)
Sodium: 140 meq/L (ref 135–145)
Total Bilirubin: 0.3 mg/dL (ref 0.2–1.2)

## 2014-10-27 LAB — HEMOGLOBIN A1C
Hgb A1c MFr Bld: 5 % (ref <5.7)
Mean Plasma Glucose: 97 mg/dL (ref <117)

## 2014-10-27 LAB — LIPID PANEL
Cholesterol: 198 mg/dL (ref 0–200)
HDL: 41 mg/dL — ABNORMAL LOW (ref >=46)
LDL Cholesterol: 132 mg/dL — ABNORMAL HIGH (ref 0–99)
Total CHOL/HDL Ratio: 4.8 ratio
Triglycerides: 125 mg/dL (ref <150)
VLDL: 25 mg/dL (ref 0–40)

## 2014-10-27 LAB — TSH: TSH: 2.963 u[IU]/mL (ref 0.350–4.500)

## 2014-10-28 ENCOUNTER — Encounter: Payer: Self-pay | Admitting: Sports Medicine

## 2014-10-28 ENCOUNTER — Ambulatory Visit (INDEPENDENT_AMBULATORY_CARE_PROVIDER_SITE_OTHER): Payer: 59 | Admitting: Sports Medicine

## 2014-10-28 VITALS — BP 136/86 | HR 96 | Ht 62.0 in | Wt 243.0 lb

## 2014-10-28 DIAGNOSIS — E669 Obesity, unspecified: Secondary | ICD-10-CM | POA: Diagnosis not present

## 2014-10-28 DIAGNOSIS — G4719 Other hypersomnia: Secondary | ICD-10-CM

## 2014-10-28 MED ORDER — PHENTERMINE HCL 37.5 MG PO TABS: ORAL_TABLET | ORAL | Status: DC

## 2014-10-28 MED ORDER — LIRAGLUTIDE -WEIGHT MANAGEMENT 18 MG/3ML ~~LOC~~ SOPN: 3.0000 mg | PEN_INJECTOR | Freq: Every day | SUBCUTANEOUS | Status: DC

## 2014-10-28 NOTE — Assessment & Plan Note (Signed)
With excessive snoring. Ordering a split-night sleep study.

## 2014-10-28 NOTE — Progress Notes (Signed)
  Subjective:    CC: Follow-up  HPI: Daytime sleepiness: Snores significantly at night, her husband also has sleep apnea.  Obesity: Desires to restart weight loss measures, she has found a new job and is significantly better in terms of her daily stress. She is amenable to try weight loss before considering a sleep study for now.  Past medical history, Surgical history, Family history not pertinant except as noted below, Social history, Allergies, and medications have been entered into the medical record, reviewed, and no changes needed.   Review of Systems: No fevers, chills, night sweats, weight loss, chest pain, or shortness of breath.   Objective:    General: Well Developed, well nourished, and in no acute distress.  Neuro: Alert and oriented x3, extra-ocular muscles intact, sensation grossly intact.  HEENT: Normocephalic, atraumatic, pupils equal round reactive to light, neck supple, no masses, no lymphadenopathy, thyroid nonpalpable.  Skin: Warm and dry, no rashes. Cardiac: Regular rate and rhythm, no murmurs rubs or gallops, no lower extremity edema.  Respiratory: Clear to auscultation bilaterally. Not using accessory muscles, speaking in full sentences.  Impression and Recommendations:

## 2014-10-28 NOTE — Assessment & Plan Note (Signed)
We are going to try weight loss again with phentermine and Saxenda. Her husband is a Marine scientist and can do the subcutaneous injections. Return in a month.

## 2014-10-29 ENCOUNTER — Telehealth: Payer: Self-pay | Admitting: Sports Medicine

## 2014-10-29 NOTE — Telephone Encounter (Signed)
Received fax for prior authorization on Saxenda I sent it through cover my meds and now waiting on authorization. - CF

## 2014-11-02 ENCOUNTER — Encounter: Payer: BC Managed Care – PPO | Admitting: Physician Assistant

## 2014-11-02 DIAGNOSIS — R51 Headache: Secondary | ICD-10-CM

## 2014-11-02 NOTE — Telephone Encounter (Signed)
Received fax from [facility] and medication was denied due to prescribing along with another weight loss medication. - CF

## 2014-11-03 ENCOUNTER — Telehealth: Payer: Self-pay | Admitting: Sports Medicine

## 2014-11-03 NOTE — Telephone Encounter (Signed)
I resubmitted prior authorization for Saxenda through cover my meds waiting on determination of claim. cf

## 2014-11-04 NOTE — Telephone Encounter (Signed)
Received fax from [facility] and Regina [name] is approved from 11/03/2014 - 02/23/2015. Case # [id number]

## 2014-11-22 ENCOUNTER — Other Ambulatory Visit: Payer: Self-pay | Admitting: Sports Medicine

## 2014-11-25 ENCOUNTER — Ambulatory Visit: Payer: 59 | Admitting: Sports Medicine

## 2014-12-02 ENCOUNTER — Other Ambulatory Visit: Payer: Self-pay | Admitting: Sports Medicine

## 2014-12-20 ENCOUNTER — Ambulatory Visit (HOSPITAL_BASED_OUTPATIENT_CLINIC_OR_DEPARTMENT_OTHER): Payer: 59 | Attending: Sports Medicine | Admitting: Radiology

## 2014-12-20 VITALS — Ht 62.0 in | Wt 240.0 lb

## 2014-12-20 DIAGNOSIS — G4719 Other hypersomnia: Secondary | ICD-10-CM | POA: Diagnosis not present

## 2014-12-20 DIAGNOSIS — Z6841 Body Mass Index (BMI) 40.0 and over, adult: Secondary | ICD-10-CM | POA: Insufficient documentation

## 2014-12-20 DIAGNOSIS — E669 Obesity, unspecified: Secondary | ICD-10-CM | POA: Diagnosis not present

## 2014-12-20 DIAGNOSIS — R0683 Snoring: Secondary | ICD-10-CM | POA: Insufficient documentation

## 2014-12-26 DIAGNOSIS — G4719 Other hypersomnia: Secondary | ICD-10-CM | POA: Diagnosis not present

## 2014-12-26 DIAGNOSIS — E669 Obesity, unspecified: Secondary | ICD-10-CM | POA: Diagnosis not present

## 2014-12-26 NOTE — Progress Notes (Signed)
  Patient Name: Regina [name], Headrick Date: 12/20/2014 Gender: Female D.O.B: 01/09/1976 Age (years): 39 Referring Provider: Gwen Her Thekkekandam Height (inches): 62 Interpreting Physician: Baird Lyons MD, ABSM Weight (lbs): 240 RPSGT: Carolin Coy BMI: 44 MRN: [id number] Neck Size: 15.50 CLINICAL INFORMATION Sleep Study Type: NPSG  Indication for sleep study: Excessive Daytime Sleepiness, Obesity, Snoring  Epworth Sleepiness Score: 2  SLEEP STUDY TECHNIQUE As per the AASM Manual for the Scoring of Sleep and Associated Events v2.3 (April 2016) with a hypopnea requiring 4% desaturations.  The channels recorded and monitored were frontal, central and occipital EEG, electrooculogram (EOG), submentalis EMG (chin), nasal and oral airflow, thoracic and abdominal wall motion, anterior tibialis EMG, snore microphone, electrocardiogram, and pulse oximetry.  MEDICATIONS Patient's medications include: charted for review. Medications self-administered by patient during sleep study :Citalopram  SLEEP ARCHITECTURE The study was initiated at 10:29:21 PM and ended at 5:05:13 AM.  Sleep onset time was 64.2 minutes and the sleep efficiency was 58.0%. The total sleep time was 229.5 minutes.  Stage REM latency was 329.0 minutes.  The patient spent 20.70% of the night in stage N1 sleep, 78.21% in stage N2 sleep, 0.00% in stage N3 and 1.09% in REM.  Alpha intrusion was absent.  Supine sleep was 0.65%.  Awake after sleep onset: 102.2 minutes  RESPIRATORY PARAMETERS The overall apnea/hypopnea index (AHI) was 0.0 per hour. There were 0 total apneas, including 0 obstructive, 0 central and 0 mixed apneas. There were 0 hypopneas and 0 RERAs.  The AHI during Stage REM sleep was 0.0 per hour.  AHI while supine was 0.0 per hour.  The mean oxygen saturation was 94.78%. The minimum SpO2 during sleep was 90.00%.  Soft snoring was noted during this study.  CARDIAC DATA The 2 lead EKG  demonstrated sinus rhythm. The mean heart rate was 67.44 beats per minute. Other EKG findings include: None.   LEG MOVEMENT DATA The total PLMS were 0 with a resulting PLMS index of 0.00. Associated arousal with leg movement index was 0.0 .  IMPRESSIONS No significant obstructive sleep apnea occurred during this study (AHI = 0.0/h). No significant central sleep apnea occurred during this study (CAI = 0.0/h). The patient had minimal or no oxygen desaturation during the study (Min O2 = 90.00%) The patient snored with Soft snoring volume. No cardiac abnormalities were noted during this study. Clinically significant periodic limb movements did not occur during sleep. No significant associated arousals.   DIAGNOSIS  Normal study  RECOMMENDATIONS Avoid alcohol, sedatives and other CNS depressants that may worsen sleep apnea and disrupt normal sleep architecture. Sleep hygiene should be reviewed to assess factors that may improve sleep quality. Weight management and regular exercise should be initiated or continued if appropriate.  Deneise Lever Diplomate, American Board of Sleep Medicine  ELECTRONICALLY SIGNED ON:  12/26/2014, 10:47 AM [place] PH: [phone]   FX: [phone] [place]

## 2014-12-28 ENCOUNTER — Ambulatory Visit: Payer: 59 | Admitting: Sports Medicine

## 2014-12-29 ENCOUNTER — Ambulatory Visit (INDEPENDENT_AMBULATORY_CARE_PROVIDER_SITE_OTHER): Payer: 59 | Admitting: Sports Medicine

## 2014-12-29 ENCOUNTER — Encounter: Payer: Self-pay | Admitting: Sports Medicine

## 2014-12-29 VITALS — BP 145/88 | HR 99 | Ht 62.0 in | Wt 239.0 lb

## 2014-12-29 DIAGNOSIS — E669 Obesity, unspecified: Secondary | ICD-10-CM | POA: Diagnosis not present

## 2014-12-29 DIAGNOSIS — G4719 Other hypersomnia: Secondary | ICD-10-CM

## 2014-12-29 MED ORDER — PHENTERMINE HCL 37.5 MG PO TABS: ORAL_TABLET | ORAL | Status: DC

## 2014-12-29 NOTE — Progress Notes (Signed)
  Subjective:    CC:  Follow-up  HPI: Obesity: 4 pound weight loss on phentermine after one month, she has not yet started Saxenda due to questions about thyroid cancer in her mother. On further questioning her mother had a biopsy, no cancer was diagnosed, simply mild dysplasia. No evidence of medullary thyroid carcinoma in the family, patient encouraged to proceed with Saxenda.  Excessive daytime sleepiness: Sleep study was negative.  Past medical history, Surgical history, Family history not pertinant except as noted below, Social history, Allergies, and medications have been entered into the medical record, reviewed, and no changes needed.   Review of Systems: No fevers, chills, night sweats, weight loss, chest pain, or shortness of breath.   Objective:    General: Well Developed, well nourished, and in no acute distress.  Neuro: Alert and oriented x3, extra-ocular muscles intact, sensation grossly intact.  HEENT: Normocephalic, atraumatic, pupils equal round reactive to light, neck supple, no masses, no lymphadenopathy, thyroid nonpalpable.  Skin: Warm and dry, no rashes. Cardiac: Regular rate and rhythm, no murmurs rubs or gallops, no lower extremity edema.  Respiratory: Clear to auscultation bilaterally. Not using accessory muscles, speaking in full sentences.  Impression and Recommendations:    I spent 25 minutes with this patient, greater than 50% was face-to-face time counseling regarding the above diagnoses

## 2014-12-29 NOTE — Assessment & Plan Note (Signed)
Has not yet started Saxenda, continue phentermine, 4 pound weight loss.  Return in one month. She will use Zofran with initial doses of Saxenda.

## 2014-12-29 NOTE — Assessment & Plan Note (Signed)
No evidence of central or peripheral sleep apnea. We will continue to work on weight loss interventions.

## 2015-01-20 ENCOUNTER — Other Ambulatory Visit: Payer: Self-pay | Admitting: Family Medicine

## 2015-01-21 NOTE — Telephone Encounter (Signed)
Can we refill this for migraines?

## 2015-01-26 ENCOUNTER — Ambulatory Visit: Payer: 59 | Admitting: Sports Medicine

## 2015-04-13 ENCOUNTER — Other Ambulatory Visit: Payer: Self-pay | Admitting: Sports Medicine

## 2015-05-22 ENCOUNTER — Other Ambulatory Visit: Payer: Self-pay | Admitting: Sports Medicine

## 2015-05-25 ENCOUNTER — Other Ambulatory Visit: Payer: Self-pay | Admitting: Sports Medicine

## 2015-06-03 ENCOUNTER — Telehealth: Payer: Self-pay | Admitting: Sports Medicine

## 2015-06-03 NOTE — Telephone Encounter (Signed)
Submitted for PA on Citalopram 20mg  tablets. This has been approved. Auth #[id number]. Valid: 05/24/15-05/23/16. Pharmacy notified.

## 2015-06-21 ENCOUNTER — Ambulatory Visit (INDEPENDENT_AMBULATORY_CARE_PROVIDER_SITE_OTHER): Payer: 59 | Admitting: Sports Medicine

## 2015-06-21 DIAGNOSIS — F329 Major depressive disorder, single episode, unspecified: Secondary | ICD-10-CM

## 2015-06-21 DIAGNOSIS — F418 Other specified anxiety disorders: Secondary | ICD-10-CM | POA: Diagnosis not present

## 2015-06-21 DIAGNOSIS — F32A Depression, unspecified: Secondary | ICD-10-CM

## 2015-06-21 DIAGNOSIS — F419 Anxiety disorder, unspecified: Principal | ICD-10-CM

## 2015-06-21 MED ORDER — LORAZEPAM 2 MG/ML IJ SOLN
2.0000 mg | Freq: Once | INTRAMUSCULAR | Status: AC
  Administered 2015-06-21: 2 mg via INTRAMUSCULAR

## 2015-06-21 MED ORDER — TRAZODONE HCL 150 MG PO TABS: 150.0000 mg | ORAL_TABLET | Freq: Every day | ORAL | Status: DC

## 2015-06-21 MED ORDER — ALPRAZOLAM 0.5 MG PO TABS: 0.5000 mg | ORAL_TABLET | Freq: Three times a day (TID) | ORAL | Status: DC | PRN

## 2015-06-21 MED ORDER — CITALOPRAM HYDROBROMIDE 40 MG PO TABS: 40.0000 mg | ORAL_TABLET | Freq: Every day | ORAL | Status: DC

## 2015-06-21 NOTE — Progress Notes (Signed)
  Subjective:    CC: Panic  HPI: This is a pleasant 40 year old female with a history of anxiety and depression, for the past several days she's had severe worsening of her anxiety, she is tearful, and endorses severe nervousness, anxiety, difficulty controlling her worry, worrying about different things, irritability, and trouble relaxing, moderate restlessness and fear of impending doom. She admits that there is no identifiable cause for her anxiety, or her tearfulness. She denies any suicidal or homicidal ideation. She also endorses episodes of chest pain, palpitations, hyperventilation, as well as tingling in the extremities. Chest pain is nonexertional and not associated with nausea, diaphoresis.  Past medical history, Surgical history, Family history not pertinant except as noted below, Social history, Allergies, and medications have been entered into the medical record, reviewed, and no changes needed.   Review of Systems: No fevers, chills, night sweats, weight loss, chest pain, or shortness of breath.   Objective:    General: Well Developed, well nourished, and in no acute distress.  Neuro: Alert and oriented x3, extra-ocular muscles intact, sensation grossly intact.  HEENT: Normocephalic, atraumatic, pupils equal round reactive to light, neck supple, no masses, no lymphadenopathy, thyroid nonpalpable.  Skin: Warm and dry, no rashes. Cardiac: Regular rate and rhythm, no murmurs rubs or gallops, no lower extremity edema.  Respiratory: Clear to auscultation bilaterally. Not using accessory muscles, speaking in full sentences.  Impression and Recommendations:   I spent 25 minutes with this patient, greater than 50% was face-to-face time counseling regarding the above diagnoses

## 2015-06-21 NOTE — Assessment & Plan Note (Signed)
Nearing acute panic. Discontinued Effexor secondary to a sensation of palpitations. Currently on Celexa which we will bump to the maximum dose, refill trazodone, Ativan 2 mg intramuscular today and a short course of alprazolam to use at home.

## 2015-06-21 NOTE — Addendum Note (Signed)
Addended by: Elizabeth Sauer on: 06/21/2015 05:15 PM   Modules accepted: Orders

## 2015-06-26 ENCOUNTER — Other Ambulatory Visit: Payer: Self-pay | Admitting: Sports Medicine

## 2015-06-27 ENCOUNTER — Ambulatory Visit: Payer: 59 | Admitting: Sports Medicine

## 2015-06-27 ENCOUNTER — Ambulatory Visit (INDEPENDENT_AMBULATORY_CARE_PROVIDER_SITE_OTHER): Payer: 59 | Admitting: Sports Medicine

## 2015-06-27 DIAGNOSIS — F419 Anxiety disorder, unspecified: Principal | ICD-10-CM

## 2015-06-27 DIAGNOSIS — F32A Depression, unspecified: Secondary | ICD-10-CM

## 2015-06-27 DIAGNOSIS — F329 Major depressive disorder, single episode, unspecified: Secondary | ICD-10-CM

## 2015-06-27 DIAGNOSIS — F418 Other specified anxiety disorders: Secondary | ICD-10-CM

## 2015-06-27 MED ORDER — ARIPIPRAZOLE 5 MG PO TABS: ORAL_TABLET | ORAL | Status: DC

## 2015-06-27 NOTE — Assessment & Plan Note (Signed)
Severe uncontrolled depression. Continue 40 mg of Celexa and adding Abilify 10 mg. Avoiding benzodiazepines for now. She did take a large amount of Benadryl, and tells me that her intent was not self harm, and denies any suicidal or homicidal ideation. She contracts for safety and her husband agrees to stay at home for the next week or so and keep a close eye on her. He is a Marine scientist and will be needing FMLA paperwork. Return to see me in one week. Ultimately we are trying to avoid inpatient psychiatric hospitalization.

## 2015-06-27 NOTE — Progress Notes (Signed)
  Subjective:    CC: uncontrolled depression  HPI: This is a pleasant 40 year old female, we have been working to treat her depression for some time now, at the last visit we increased citalopram to a full 40 mg daily, also added a shot of Ativan which did not provide much relief, unfortunately she continues to have severe panic, depressed mood, tearfulness, and inability to function in her job. She took a bit too much Benadryl and was sleepy but tells me that her goal was not self-harm. Her husband is with her today and is able to take some time off work to keep a close eye's on her. Symptoms are severe.  She recently quit her job without notice.  Currently she denies any suicidal or homicidal ideation but wants everything possible to stay out of inpatient psychiatric hospitalization.  Past medical history, Surgical history, Family history not pertinant except as noted below, Social history, Allergies, and medications have been entered into the medical record, reviewed, and no changes needed.   Review of Systems: No fevers, chills, night sweats, weight loss, chest pain, or shortness of breath.   Objective:    General: Well Developed, well nourished, and in no acute distress. Tearful in exam room. Neuro: Alert and oriented x3, extra-ocular muscles intact, sensation grossly intact.  HEENT: Normocephalic, atraumatic, pupils equal round reactive to light, neck supple, no masses, no lymphadenopathy, thyroid nonpalpable.  Skin: Warm and dry, no rashes. Cardiac: Regular rate and rhythm, no murmurs rubs or gallops, no lower extremity edema.  Respiratory: Clear to auscultation bilaterally. Not using accessory muscles, speaking in full sentences.  Impression and Recommendations:    I spent 25 minutes with this patient, greater than 50% was face-to-face time counseling regarding the above diagnoses

## 2015-07-04 ENCOUNTER — Encounter: Payer: Self-pay | Admitting: Sports Medicine

## 2015-07-04 ENCOUNTER — Ambulatory Visit (INDEPENDENT_AMBULATORY_CARE_PROVIDER_SITE_OTHER): Payer: 59 | Admitting: Sports Medicine

## 2015-07-04 VITALS — BP 123/76 | HR 77 | Wt 237.0 lb

## 2015-07-04 DIAGNOSIS — F419 Anxiety disorder, unspecified: Principal | ICD-10-CM

## 2015-07-04 DIAGNOSIS — F418 Other specified anxiety disorders: Secondary | ICD-10-CM

## 2015-07-04 DIAGNOSIS — F32A Depression, unspecified: Secondary | ICD-10-CM

## 2015-07-04 DIAGNOSIS — F329 Major depressive disorder, single episode, unspecified: Secondary | ICD-10-CM

## 2015-07-04 NOTE — Progress Notes (Signed)
  Subjective:    CC: follow-up  HPI: Uncontrolled depression: Nadolny returns, she continued with Celexa 40, and we added Abilify 10 the last visit, she returns today feeling significantly better, happier, and in good spirits. No suicidal or homicidal ideation and no manic type symptoms.  Past medical history, Surgical history, Family history not pertinant except as noted below, Social history, Allergies, and medications have been entered into the medical record, reviewed, and no changes needed.   Review of Systems: No fevers, chills, night sweats, weight loss, chest pain, or shortness of breath.   Objective:    General: Well Developed, well nourished, and in no acute distress.  Neuro: Alert and oriented x3, extra-ocular muscles intact, sensation grossly intact.  HEENT: Normocephalic, atraumatic, pupils equal round reactive to light, neck supple, no masses, no lymphadenopathy, thyroid nonpalpable.  Skin: Warm and dry, no rashes. Cardiac: Regular rate and rhythm, no murmurs rubs or gallops, no lower extremity edema.  Respiratory: Clear to auscultation bilaterally. Not using accessory muscles, speaking in full sentences.  Impression and Recommendations:

## 2015-07-04 NOTE — Assessment & Plan Note (Signed)
Continues with 40 of Celexa, did extremely well with the addition of 10 mg of Abilify and depressive symptoms have improved significantly. No suicidal or homicidal ideation. Return to see me in one month.

## 2015-07-20 ENCOUNTER — Other Ambulatory Visit: Payer: Self-pay | Admitting: Sports Medicine

## 2015-07-22 ENCOUNTER — Other Ambulatory Visit: Payer: Self-pay | Admitting: Sports Medicine

## 2015-07-22 ENCOUNTER — Ambulatory Visit (INDEPENDENT_AMBULATORY_CARE_PROVIDER_SITE_OTHER): Payer: 59 | Admitting: Sports Medicine

## 2015-07-22 ENCOUNTER — Ambulatory Visit (INDEPENDENT_AMBULATORY_CARE_PROVIDER_SITE_OTHER): Payer: 59

## 2015-07-22 ENCOUNTER — Encounter: Payer: Self-pay | Admitting: Sports Medicine

## 2015-07-22 VITALS — BP 135/87 | HR 88 | Resp 18 | Wt 235.9 lb

## 2015-07-22 DIAGNOSIS — M545 Low back pain, unspecified: Secondary | ICD-10-CM | POA: Insufficient documentation

## 2015-07-22 MED ORDER — KETOROLAC TROMETHAMINE 30 MG/ML IJ SOLN
30.0000 mg | Freq: Once | INTRAMUSCULAR | Status: AC
  Administered 2015-07-22: 30 mg via INTRAMUSCULAR

## 2015-07-22 MED ORDER — METHYLPREDNISOLONE SODIUM SUCC 125 MG IJ SOLR
125.0000 mg | Freq: Once | INTRAMUSCULAR | Status: AC
  Administered 2015-07-22: 125 mg via INTRAMUSCULAR

## 2015-07-22 NOTE — Progress Notes (Signed)
  Subjective:    CC: Low back pain  HPI: While having sex this pleasant 40 year old female developed moderate lower back pain without radiation, worse with sitting, flexion, Valsalva, no bowel or bladder dysfunction, no saddle numbness, no constitutional symptoms.  Past medical history, Surgical history, Family history not pertinant except as noted below, Social history, Allergies, and medications have been entered into the medical record, reviewed, and no changes needed.   Review of Systems: No fevers, chills, night sweats, weight loss, chest pain, or shortness of breath.   Objective:    General: Well Developed, well nourished, and in no acute distress.  Neuro: Alert and oriented x3, extra-ocular muscles intact, sensation grossly intact.  HEENT: Normocephalic, atraumatic, pupils equal round reactive to light, neck supple, no masses, no lymphadenopathy, thyroid nonpalpable.  Skin: Warm and dry, no rashes. Cardiac: Regular rate and rhythm, no murmurs rubs or gallops, no lower extremity edema.  Respiratory: Clear to auscultation bilaterally. Not using accessory muscles, speaking in full sentences. Back Exam:  Inspection: Unremarkable  Motion: Flexion 45 deg, Extension 45 deg, Side Bending to 45 deg bilaterally,  Rotation to 45 deg bilaterally  SLR laying: Negative  XSLR laying: Negative  Palpable tenderness: None. FABER: negative. Sensory change: Gross sensation intact to all lumbar and sacral dermatomes.  Reflexes: 2+ at both patellar tendons, 2+ at achilles tendons, Babinski's downgoing.  Strength at foot  Plantar-flexion: 5/5 Dorsi-flexion: 5/5 Eversion: 5/5 Inversion: 5/5  Leg strength  Quad: 5/5 Hamstring: 5/5 Hip flexor: 5/5 Hip abductors: 5/5  Gait unremarkable.  Impression and Recommendations:    I spent 25 minutes with this patient, greater than 50% was face-to-face time counseling regarding the above diagnoses

## 2015-07-22 NOTE — Assessment & Plan Note (Signed)
Toradol, Solu-Medrol, x-rays, formal physical therapy, return in one month, MRI of no better.

## 2015-08-01 ENCOUNTER — Ambulatory Visit (INDEPENDENT_AMBULATORY_CARE_PROVIDER_SITE_OTHER): Payer: 59 | Admitting: Physical Therapy

## 2015-08-01 ENCOUNTER — Other Ambulatory Visit: Payer: Self-pay | Admitting: Sports Medicine

## 2015-08-01 ENCOUNTER — Ambulatory Visit: Payer: 59 | Admitting: Sports Medicine

## 2015-08-01 ENCOUNTER — Encounter: Payer: Self-pay | Admitting: Physical Therapy

## 2015-08-01 DIAGNOSIS — M545 Low back pain, unspecified: Secondary | ICD-10-CM

## 2015-08-01 DIAGNOSIS — M6281 Muscle weakness (generalized): Secondary | ICD-10-CM

## 2015-08-01 DIAGNOSIS — R29898 Other symptoms and signs involving the musculoskeletal system: Secondary | ICD-10-CM

## 2015-08-01 DIAGNOSIS — R198 Other specified symptoms and signs involving the digestive system and abdomen: Secondary | ICD-10-CM | POA: Diagnosis not present

## 2015-08-01 NOTE — Therapy (Signed)
[facility], [place], [address] Phone: [phone]   Fax:  [phone]  Physical Therapy Evaluation  Patient Details  Name: Regina [name] MRN: [id number] Date of Birth: October 31, 1975 Referring Provider: Dr Dianah Field  Encounter Date: 08/01/2015      PT End of Session - 08/01/15 1017    Visit Number 1   Number of Visits 12   Date for PT Re-Evaluation 09/12/15   PT Start Time 1018   PT Stop Time 1104   PT Time Calculation (min) 46 min   Activity Tolerance Patient limited by pain      Past Medical History  Diagnosis Date  . Migraine   . Depression with anxiety   . Panic disorder   . Morbid obesity [facility])     Past Surgical History  Procedure Laterality Date  . Soft tissue tumor resection  06/17/07    chest/ wound reclosure 4/09'  . Dilation and curettage, diagnostic / therapeutic    . 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  Visit Diagnosis:  Bilateral low back pain without sciatica - Plan: PT plan of care cert/re-cert  Weakness of back - Plan: PT plan of care cert/re-cert  Abdominal weakness - Plan: PT plan of care cert/re-cert      Subjective Assessment - 08/01/15 1020    Subjective Pt reports she developed low back pain about 2 weeks ago, woke up with the pain after being out of town and standing a lot. She has been using medication, rest, ice   How long can you sit comfortably? with in 5' unless she is in her recliner than 2 hrs.    How long can you stand comfortably? 20-30 min   How long can you walk comfortably? 1/2 mile   Diagnostic tests x-rays DDD   Patient Stated Goals back relief, improve her ability to walk. 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    Currently in Pain? Yes   Pain Score 1    Pain Location Back   Pain Orientation Left;Right;Lower   Pain Descriptors / Indicators Tightness;Dull;Sharp   Pain Radiating Towards into  bilat buttocks.    Pain Onset 1 to 4 weeks ago   Pain Frequency Constant   Aggravating Factors  lying on her side.    Pain Relieving Factors ice, rest and medication.             [facility] PT Assessment - 08/01/15 0001    Assessment   Medical Diagnosis Bilat LBP   Referring Provider Dr Dianah Field   Onset Date/Surgical Date 07/18/15   Hand Dominance Left   Next MD Visit 08/22/15   Prior Therapy none   Precautions   Precautions None   Balance Screen   Has the patient fallen in the past 6 months No   Has the patient had a decrease in activity level because of a fear of falling?  No   Is the patient reluctant to leave their home because of a fear of falling?  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   Leisure reading   Observation/Other Assessments   Focus on Therapeutic Outcomes (FOTO)  56% limited   Functional Tests  Functional tests Squat;Single leg stance   Squat   Comments WNL   Single Leg Stance   Comments WNL   Posture/Postural Control   Posture/Postural Control Postural limitations   Postural Limitations Rounded Shoulders;Forward head;Decreased lumbar lordosis  extra abdominal girth   ROM / Strength   AROM / PROM / Strength AROM;Strength   AROM   Overall AROM Comments hips limited due to soft tissue approxition   AROM Assessment Site Lumbar   Lumbar Flexion WNl   Lumbar Extension WNL   Lumbar - Right Side Bend WNL   Lumbar - Left Side Bend WNL   Lumbar - Right Rotation WNL   Lumbar - Left Rotation WNL   Strength   Overall Strength Comments LE's WNL except Lt hip extension 4-/5 with back pain. Core strength is poor   Flexibility   Soft Tissue Assessment /Muscle Length yes   Hamstrings WNL   Palpation   Spinal mobility unable to assess due to  muscular guarding   Palpation comment very tight in biilat lumbar paraspinals and Rt buttocks, some tightness in the Left buttocks.    Special Tests    Special Tests --  (-) lumbar special tests.                    [place] Adult PT Treatment/Exercise - 08/01/15 0001    Exercises   Exercises Lumbar   Lumbar Exercises: Stretches   Single Knee to Chest Stretch 20 seconds;2 reps   Lower Trunk Rotation --  10 reps   Pelvic Tilt --  10 reps   Modalities   Modalities Electrical Stimulation;Moist Heat   Moist Heat Therapy   Number Minutes Moist Heat 15 Minutes   Moist Heat Location Lumbar Spine  and bilat buttocks   Electrical Stimulation   Electrical Stimulation Location lumbar and bilat buttocks   Electrical Stimulation Action IFC   Electrical Stimulation Parameters  to tolerance   Electrical Stimulation Goals Tone;Pain                PT Education - 08/01/15 1055    Education provided Yes   Education Details HEP   Person(s) Educated Patient   Methods Explanation;Demonstration;Handout   Comprehension Returned demonstration;Verbalized understanding             PT Long Term Goals - 08/01/15 1058    PT LONG TERM GOAL #1   Title I with advanced HEP to include body mechanics ( 09/12/15)    Time 6   Period Weeks   Status New   PT LONG TERM GOAL #2   Title perform core strengthening with good stabilization of her pelvis ( 09/12/15)    Time 6   Period Weeks   Status New   PT LONG TERM GOAL #3   Title report =/> 75% reduction of pain in her low back ( 09/12/15)    Time 6   Period Weeks   Status New   PT LONG TERM GOAL #4   Title return to her prior level of sitting and walking without difficulty ( 09/12/15)    Time 6   Period Weeks   Status New   PT LONG TERM GOAL #5   Title improve FOTO =/< 35% limited ( 09/12/15)    Time 6   Period Weeks   Status New               Plan - 08/01/15 1055    Clinical Impression Statement 33 y female presents  with ~ 2 wk h/o low back pain.  She has significantly tight muscles in her low back and bilat buttocks, Rt > Lt.  She also has core weakness and is overweight.  This is the first time she has had this type of pain/injury.    Pt will benefit from skilled therapeutic intervention in order to improve on the following deficits Postural dysfunction;Decreased strength;Pain;Increased muscle spasms   Rehab Potential Good   PT Frequency 2x / week   PT Duration 6 weeks   PT Treatment/Interventions Ultrasound;Neuromuscular re-education;Patient/family education;Cryotherapy;Electrical Stimulation;Moist Heat;Therapeutic exercise;Manual techniques;Taping;Dry needling   PT Next Visit Plan manual therapy to her low back/buttocks, she is not interested in TDN at this time however would benefit from it.  Also core strengthening .   Consulted and Agree with Plan of Care Patient         Problem List Patient Active Problem List   Diagnosis Date Noted  . Low back pain 07/22/2015  . Excessive daytime sleepiness 10/28/2014  . Left cervical radiculopathy 04/02/2014  . Insomnia 12/01/2013  . Migraine without aura 08/04/2013  . Anxiety and depression 03/25/2013  . Hyperlipidemia 03/11/2012  . Preventive measure 03/10/2012  . Obesity 03/10/2012  . TERATOMA 03/07/2010    Jeral Pinch PT  08/01/2015, 11:02 AM  [facility] [place], [place], [address] Phone: [phone]   Fax:  [phone]  Name: Regina [name] MRN: [id number] Date of Birth: 30-Jan-1976

## 2015-08-01 NOTE — Patient Instructions (Signed)
Lumbar Rotation (Non-Weight Bearing)  [place] [id number]    Feet on floor, slowly rock knees from side to side in small, pain-free range of motion. Allow lower back to rotate slightly. Repeat ____ times per set. Do ____ sets per session. Do _2-3___ sessions per day.  Knee-to-Chest Stretch: Unilateral    With hand behind right knee, pull knee in to chest until a comfortable stretch is felt in lower back and buttocks. Keep back relaxed. Hold __20-30__ seconds. Repeat __2__ times per set. Do __1__ sets per session. Do ____ sessions per day.   Pelvic Tilt: Posterior - Legs Bent (Supine)    Tighten stomach and flatten back by rolling pelvis down. Hold _2-3___ seconds. Relax. Repeat _10___ times per set. Do __1__ sets per session. Do __2-3__ sessions per day.  Use heat at least once a day, 15-20 minutes, Use the TENS machine and increase walking ( start with 10 min ,3 times a day)  Copyright  VHI. All rights reserved.

## 2015-08-02 ENCOUNTER — Ambulatory Visit: Payer: 59 | Admitting: Sports Medicine

## 2015-08-02 ENCOUNTER — Telehealth: Payer: Self-pay

## 2015-08-02 DIAGNOSIS — F32A Depression, unspecified: Secondary | ICD-10-CM

## 2015-08-02 DIAGNOSIS — F419 Anxiety disorder, unspecified: Principal | ICD-10-CM

## 2015-08-02 DIAGNOSIS — F329 Major depressive disorder, single episode, unspecified: Secondary | ICD-10-CM

## 2015-08-02 NOTE — Telephone Encounter (Signed)
Pt left VM stating price of Abilify has gone up to $650. States she's been out for 2 days and she feels fine but would like to know if you would like to switch her to something else that will be reasonable price wise. Please advise.

## 2015-08-04 MED ORDER — BUPROPION HCL ER (XL) 150 MG PO TB24: 150.0000 mg | ORAL_TABLET | ORAL | Status: DC

## 2015-08-04 NOTE — Assessment & Plan Note (Signed)
Did well with 40 of Celexa and 10 of Abilify, unfortunately Abilify has now become too expensive, adding Wellbutrin 150 as a replacement.

## 2015-08-04 NOTE — Telephone Encounter (Signed)
adding Wellbutrin 150 as a replacement.

## 2015-08-04 NOTE — Telephone Encounter (Signed)
Left detailed message and call back # for questions.

## 2015-08-05 ENCOUNTER — Encounter: Payer: 59 | Admitting: Rehabilitative and Restorative Service Providers"

## 2015-08-09 ENCOUNTER — Encounter: Payer: 59 | Admitting: Rehabilitative and Restorative Service Providers"

## 2015-08-11 ENCOUNTER — Encounter: Payer: 59 | Admitting: Physical Therapy

## 2015-08-13 ENCOUNTER — Other Ambulatory Visit: Payer: Self-pay | Admitting: Sports Medicine

## 2015-08-16 ENCOUNTER — Encounter: Payer: 59 | Admitting: Physical Therapy

## 2015-08-19 ENCOUNTER — Ambulatory Visit: Payer: 59 | Admitting: Sports Medicine

## 2015-08-22 ENCOUNTER — Ambulatory Visit: Payer: 59 | Admitting: Sports Medicine

## 2015-08-23 ENCOUNTER — Other Ambulatory Visit: Payer: Self-pay | Admitting: Sports Medicine

## 2015-08-24 ENCOUNTER — Other Ambulatory Visit: Payer: Self-pay | Admitting: Sports Medicine

## 2015-08-25 ENCOUNTER — Other Ambulatory Visit: Payer: Self-pay | Admitting: Sports Medicine

## 2015-08-30 ENCOUNTER — Other Ambulatory Visit: Payer: Self-pay | Admitting: Sports Medicine

## 2015-09-02 ENCOUNTER — Other Ambulatory Visit: Payer: Self-pay | Admitting: Sports Medicine

## 2015-09-05 ENCOUNTER — Other Ambulatory Visit: Payer: Self-pay | Admitting: Sports Medicine

## 2015-09-10 ENCOUNTER — Other Ambulatory Visit: Payer: Self-pay | Admitting: Sports Medicine

## 2015-09-11 ENCOUNTER — Other Ambulatory Visit: Payer: Self-pay | Admitting: Sports Medicine

## 2015-09-20 ENCOUNTER — Other Ambulatory Visit: Payer: Self-pay | Admitting: Sports Medicine

## 2015-09-20 ENCOUNTER — Ambulatory Visit: Payer: 59 | Admitting: Sports Medicine

## 2015-09-20 MED FILL — traZODone HCL 150 MG TABS: 150 | 30 days supply | Qty: 30 | Fill #0

## 2015-09-21 ENCOUNTER — Ambulatory Visit (INDEPENDENT_AMBULATORY_CARE_PROVIDER_SITE_OTHER): Payer: 59 | Admitting: Physical Therapy

## 2015-09-21 DIAGNOSIS — R29898 Other symptoms and signs involving the musculoskeletal system: Secondary | ICD-10-CM

## 2015-09-21 DIAGNOSIS — R198 Other specified symptoms and signs involving the digestive system and abdomen: Secondary | ICD-10-CM | POA: Diagnosis not present

## 2015-09-21 DIAGNOSIS — M545 Low back pain, unspecified: Secondary | ICD-10-CM

## 2015-09-21 DIAGNOSIS — M6281 Muscle weakness (generalized): Secondary | ICD-10-CM

## 2015-09-21 NOTE — Therapy (Addendum)
[facility] [place], [place], [address] Phone: [phone]   Fax:  [phone]  Physical Therapy Treatment  Patient Details  Name: Regina [name] MRN: [id number] Date of Birth: 1975-10-31 Referring Provider: Dr. Helane Rima   Encounter Date: 09/21/2015      PT End of Session - 09/21/15 1442    Visit Number 2   Number of Visits 12   Date for PT Re-Evaluation 09/12/15   PT Start Time [id number]   PT Stop Time 1528   PT Time Calculation (min) 49 min   Activity Tolerance Patient tolerated treatment well  some spasming in LB with exercise, resolved with rest.       Past Medical History  Diagnosis Date  . Migraine   . Depression with anxiety   . Panic disorder   . Morbid obesity [facility])     Past Surgical History  Procedure Laterality Date  . Soft tissue tumor resection  06/17/07    chest/ wound reclosure 4/09'  . Dilation and curettage, diagnostic / therapeutic    . 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      Subjective Assessment - 09/21/15 1444    Subjective Pt reports her back pain had improved with stretches and time.  She took long car ride about 1 wk ago and this flared area back up. Pt reports she is interested in returning for additional therapy sessions to resolve pain.    Currently in Pain? Yes   Pain Score 5    Pain Location Back   Pain Orientation Lower;Right;Left   Aggravating Factors  long car rides, prolonged standing    Pain Relieving Factors heat            [facility] PT Assessment - 09/21/15 0001    Assessment   Medical Diagnosis Bilat LBP   Referring Provider Dr. Helane Rima    Onset Date/Surgical Date 07/18/15   Hand Dominance Left   Next MD Visit 09/22/2015   Observation/Other Assessments   Focus on Therapeutic Outcomes (FOTO)  48% limited (intake was 56%, goal of 35%)         [facility] Adult PT Treatment/Exercise - 09/21/15 0001     Self-Care   Self-Care Other Self-Care Comments   Other Self-Care Comments  Pt educated about body mechanics and use of lumbar support in chairs/car to decrease back pain.     Lumbar Exercises: Stretches   Passive Hamstring Stretch 2 reps;20 seconds   Single Knee to Chest Stretch 30 seconds;4 reps   Lower Trunk Rotation --  10 reps   Pelvic Tilt 5 reps   Pelvic Tilt Limitations VC for form, some spasming.    Lumbar Exercises: Aerobic   Stationary Bike NuStep L4: 5.5 min    Lumbar Exercises: Supine   Ab Set 10 reps;5 seconds   AB Set Limitations required VC and tactile cues   Clam 10 reps  with ab set   Bent Knee Raise 10 reps  with ab set   Modalities   Modalities Electrical Stimulation;Moist Heat   Moist Heat Therapy   Number Minutes Moist Heat 15 Minutes   Moist Heat Location Lumbar Spine   Electrical Stimulation   Electrical Stimulation Location lumbar and bilat buttocks   Electrical Stimulation Action IFC   Electrical Stimulation Parameters to tolerance    Electrical Stimulation Goals Tone;Pain  PT Long Term Goals - 09/21/15 1516    PT LONG TERM GOAL #1   Title I with advanced HEP to include body mechanics ( 09/12/15)    Time 6   Period Weeks   Status On-going   PT LONG TERM GOAL #2   Title perform core strengthening with good stabilization of her pelvis ( 09/12/15)    Time 6   Period Weeks   Status On-going   PT LONG TERM GOAL #3   Title report =/> 75% reduction of pain in her low back ( 09/12/15)    Time 6   Period Weeks   Status On-going   PT LONG TERM GOAL #4   Title return to her prior level of sitting and walking without difficulty ( 09/12/15)    Time 6   Period Weeks   Status On-going   PT LONG TERM GOAL #5   Title improve FOTO =/< 35% limited ( 09/12/15)    Time 6   Period Weeks   Status On-going               Plan - 09/21/15 1527    Clinical Impression Statement Pt presents with recent flare up of back  pain.  Pt scored slightly better on FOTO, with 48% limitation, improved from intake. Pt required some tactile/VC for improved form with exercise.  Continues with weakness in core.  Pt interested in continuation of therapy and will benefit from additonal PT intervention to maximize functional mobility and decrease pain. Patient reports she was feeling better and that is why she did not return to PT. Then after a long car ride her pain has returned.  She is now committed to attending PT    Rehab Potential Good   PT Frequency 2x / week   PT Duration 6 weeks   PT Treatment/Interventions Ultrasound;Neuromuscular re-education;Patient/family education;Cryotherapy;Electrical Stimulation;Moist Heat;Therapeutic exercise;Manual techniques;Taping;Dry needling   PT Next Visit Plan manual therapy to her low back/buttocks, review transverse abdominal exercises.  Modalities as indicated.    Consulted and Agree with Plan of Care Patient      Patient will benefit from skilled therapeutic intervention in order to improve the following deficits and impairments:  Postural dysfunction, Decreased strength, Pain, Increased muscle spasms  Visit Diagnosis: Bilateral low back pain without sciatica  Weakness of back  Abdominal weakness     Problem List Patient Active Problem List   Diagnosis Date Noted  . Low back pain 07/22/2015  . Excessive daytime sleepiness 10/28/2014  . Left cervical radiculopathy 04/02/2014  . Insomnia 12/01/2013  . Migraine without aura 08/04/2013  . Anxiety and depression 03/25/2013  . Hyperlipidemia 03/11/2012  . Preventive measure 03/10/2012  . Obesity 03/10/2012  . TERATOMA 03/07/2010   Kerin Perna, PTA 09/21/2015 3:41 PM  [facility] [place], [place], [address] Phone: [phone]   Fax:  [phone]  Name: ALESSANDRA BABLER MRN: [id number] Date of Birth: 10/04/1975

## 2015-09-21 NOTE — Patient Instructions (Signed)
Abdominal Bracing With Pelvic Floor (Hook-Lying)   With neutral spine, tighten pelvic floor and abdominals. Hold 10 seconds. Repeat __10_ times. Do _1__ times a day. Count out loud to avoid holding breath.    Knee to Chest: Transverse Plane Stability   Bring one knee up, then return. Be sure pelvis does not roll side to side. Keep pelvis still. Lift knee __10_ times each leg. Restabilize pelvis. Repeat with other leg. Do _1-2__ sets, _1__ times per day.  Hip External Rotation With Pillow: Transverse Plane Stability   BOTH KNEES BENT. Slowly roll bent knee out. Be sure pelvis does not rotate. Do _10__ times. Restabilize pelvis. Repeat with other leg. Do _1-2__ sets, _1__ times per day.  Hamstring Step 1    Straighten left knee. Keep knee level with other knee or on bolster. Hold ___ seconds. Relax knee by returning foot to start. Repeat ___ times.  Copyright  VHI. All rights reserved.   [facility], [place] [address]  [phone] (office) [phone] (fax)    Sleeping on Back  Place pillow under knees. A pillow with cervical support and a roll around waist are also helpful. Copyright  VHI. All rights reserved.  Sleeping on Side Place pillow between knees. Use cervical support under neck and a roll around waist as needed. Copyright  VHI. All rights reserved.   Sleeping on Stomach   If this is the only desirable sleeping position, place pillow under lower legs, and under stomach or chest as needed.  Posture - Sitting   Sit upright, head facing forward. Try using a roll to support lower back. Keep shoulders relaxed, and avoid rounded back. Keep hips level with knees. Avoid crossing legs for long periods. Stand to Sit / Sit to Stand   To sit: Bend knees to lower self onto front edge of chair, then scoot back on seat. To stand: Reverse sequence by placing one foot forward, and scoot to  front of seat. Use rocking motion to stand up.   Work Height and Reach  Ideal work height is no more than 2 to 4 inches below elbow level when standing, and at elbow level when sitting. Reaching should be limited to arm's length, with elbows slightly bent.  Bending  Bend at hips and knees, not back. Keep feet shoulder-width apart.    Posture - Standing   Good posture is important. Avoid slouching and forward head thrust. Maintain curve in low back and align ears over shoul- ders, hips over ankles.  Alternating Positions   Alternate tasks and change positions frequently to reduce fatigue and muscle tension. Take rest breaks. Computer Work   Position work to Programmer, multimedia. Use proper work and seat height. Keep shoulders back and down, wrists straight, and elbows at right angles. Use chair that provides full back support. Add footrest and lumbar roll as needed.  Getting Into / Out of Car  Lower self onto seat, scoot back, then bring in one leg at a time. Reverse sequence to get out.  Dressing  Lie on back to pull socks or slacks over feet, or sit and bend leg while keeping back straight.    Housework - Sink  Place one foot on ledge of cabinet under sink when standing at sink for prolonged periods.   Pushing / Pulling  Pushing is preferable to pulling. Keep back in proper alignment, and use leg muscles to do the work.  Deep Squat  Squat and lift with both arms held against upper trunk. Tighten stomach muscles without holding breath. Use smooth movements to avoid jerking.  Avoid Twisting   Avoid twisting or bending back. Pivot around using foot movements, and bend at knees if needed when reaching for articles.  Carrying Luggage   Distribute weight evenly on both sides. Use a cart whenever possible. Do not twist trunk. Move body as a unit.   Lifting Principles .Maintain proper posture and head alignment. .Slide object as close as possible before lifting. .Move  obstacles out of the way. .Test before lifting; ask for help if too heavy. .Tighten stomach muscles without holding breath. .Use smooth movements; do not jerk. .Use legs to do the work, and pivot with feet. .Distribute the work load symmetrically and close to the center of trunk. .Push instead of pull whenever possible.   Ask For Help   Ask for help and delegate to others when possible. Coordinate your movements when lifting together, and maintain the low back curve.  Log Roll   Lying on back, bend left knee and place left arm across chest. Roll all in one movement to the right. Reverse to roll to the left. Always move as one unit. Housework - Sweeping  Use long-handled equipment to avoid stooping.   Housework - Wiping  Position yourself as close as possible to reach work surface. Avoid straining your back.  Laundry - Unloading Wash   To unload small items at bottom of washer, lift leg opposite to arm being used to reach.  [place] close to area to be raked. Use arm movements to do the work. Keep back straight and avoid twisting.     Cart  When reaching into cart with one arm, lift opposite leg to keep back straight.   Getting Into / Out of Bed  Lower self to lie down on one side by raising legs and lowering head at the same time. Use arms to assist moving without twisting. Bend both knees to roll onto back if desired. To sit up, start from lying on side, and use same move-ments in reverse. Housework - Vacuuming  Hold the vacuum with arm held at side. Step back and forth to move it, keeping head up. Avoid twisting.   Laundry - IT consultant so that bending and twisting can be avoided.   Laundry - Unloading Dryer  Squat down to reach into clothes dryer or use a reacher.  Gardening - Weeding / Probation officer or Kneel. Knee pads may be helpful.

## 2015-09-21 NOTE — Addendum Note (Signed)
Addended by: SHAVER, Manuela Schwartz E on: 09/21/2015 05:32 PM   Modules accepted: Orders

## 2015-09-22 ENCOUNTER — Ambulatory Visit (INDEPENDENT_AMBULATORY_CARE_PROVIDER_SITE_OTHER): Payer: 59 | Admitting: Sports Medicine

## 2015-09-22 ENCOUNTER — Encounter: Payer: Self-pay | Admitting: Sports Medicine

## 2015-09-22 VITALS — BP 137/83 | HR 97 | Resp 18 | Wt 248.0 lb

## 2015-09-22 DIAGNOSIS — F32A Depression, unspecified: Secondary | ICD-10-CM

## 2015-09-22 DIAGNOSIS — F329 Major depressive disorder, single episode, unspecified: Secondary | ICD-10-CM

## 2015-09-22 DIAGNOSIS — M545 Low back pain, unspecified: Secondary | ICD-10-CM

## 2015-09-22 DIAGNOSIS — F418 Other specified anxiety disorders: Secondary | ICD-10-CM

## 2015-09-22 DIAGNOSIS — G43009 Migraine without aura, not intractable, without status migrainosus: Secondary | ICD-10-CM | POA: Diagnosis not present

## 2015-09-22 DIAGNOSIS — F419 Anxiety disorder, unspecified: Secondary | ICD-10-CM

## 2015-09-22 MED ORDER — PROMETHAZINE HCL 25 MG/ML IJ SOLN
25.0000 mg | Freq: Once | INTRAMUSCULAR | Status: AC
  Administered 2015-09-22: 25 mg via INTRAMUSCULAR

## 2015-09-22 MED ORDER — ARIPIPRAZOLE 5 MG PO TABS: 5.0000 mg | ORAL_TABLET | Freq: Every day | ORAL | Status: DC

## 2015-09-22 MED ORDER — KETOROLAC TROMETHAMINE 30 MG/ML IJ SOLN
30.0000 mg | Freq: Once | INTRAMUSCULAR | Status: AC
  Administered 2015-09-22: 30 mg via INTRAMUSCULAR

## 2015-09-22 MED ORDER — PROMETHAZINE HCL 12.5 MG PO TABS: 25.0000 mg | ORAL_TABLET | Freq: Once | ORAL | Status: DC

## 2015-09-22 MED ORDER — DEXAMETHASONE SODIUM PHOSPHATE 4 MG/ML IJ SOLN
4.0000 mg | Freq: Once | INTRAMUSCULAR | Status: AC
  Administered 2015-09-22: 4 mg via INTRAMUSCULAR

## 2015-09-22 MED FILL — ARIPiprazole 5 MG TABS: 5 | 30 days supply | Qty: 30 | Fill #0

## 2015-09-22 NOTE — Assessment & Plan Note (Signed)
Continues with Celexa, intolerant of Wellbutrin. Switching back to Abilify with a discount coupon.

## 2015-09-22 NOTE — Assessment & Plan Note (Signed)
Persistent low back pain. At this point we are going to obtain an MRI and she will continue with therapy, she has been greater than 6 weeks of physician directed rehabilitation, NSAIDs, we are now planning intervention.

## 2015-09-22 NOTE — Assessment & Plan Note (Signed)
Currently having a migraine, Toradol 30, Decadron 4 intramuscular, Phenergan 25 oral.

## 2015-09-22 NOTE — Progress Notes (Signed)
  Subjective:    CC: Follow-up  HPI: Back pain: Axial, discogenic, persistent despite physical therapy.  Depression: Persistent, currently on Celexa 40, intolerant of Wellbutrin, agreeable to go back to Abilify.  Migraine headache: Currently having migraine acutely, desires abortive treatment today.  Past medical history, Surgical history, Family history not pertinant except as noted below, Social history, Allergies, and medications have been entered into the medical record, reviewed, and no changes needed.   Review of Systems: No fevers, chills, night sweats, weight loss, chest pain, or shortness of breath.   Objective:    General: Well Developed, well nourished, and in no acute distress.  Neuro: Alert and oriented x3, extra-ocular muscles intact, sensation grossly intact. Cranial nerves II through XII are intact, motor, sensory, coordinative functions are all intact. Focal on exam. HEENT: Normocephalic, atraumatic, pupils equal round reactive to light, neck supple, no masses, no lymphadenopathy, thyroid nonpalpable.  Skin: Warm and dry, no rashes. Cardiac: Regular rate and rhythm, no murmurs rubs or gallops, no lower extremity edema.  Respiratory: Clear to auscultation bilaterally. Not using accessory muscles, speaking in full sentences.  Impression and Recommendations:

## 2015-09-23 ENCOUNTER — Other Ambulatory Visit: Payer: Self-pay

## 2015-09-23 ENCOUNTER — Encounter: Payer: 59 | Admitting: Physical Therapy

## 2015-09-23 ENCOUNTER — Telehealth: Payer: Self-pay

## 2015-09-23 MED ORDER — TRAMADOL HCL 50 MG PO TABS: 50.0000 mg | ORAL_TABLET | Freq: Three times a day (TID) | ORAL | Status: DC | PRN

## 2015-09-23 MED ORDER — CYCLOBENZAPRINE HCL 10 MG PO TABS: 10.0000 mg | ORAL_TABLET | Freq: Three times a day (TID) | ORAL | Status: DC | PRN

## 2015-09-23 MED FILL — traMADol HCL 50 MG TABS: 50 | 15 days supply | Qty: 90 | Fill #0

## 2015-09-23 MED FILL — CYCLOBENZAPRINE 10 MG TAB: 10 | 10 days supply | Qty: 30 | Fill #0

## 2015-09-23 NOTE — Telephone Encounter (Signed)
Use heat, and may add arthritis strength Tylenol 650mg  2 pills 3 times a day, use remaining tramadol, use anti-inflammatories as well as Flexeril. No stronger narcotics.

## 2015-09-23 NOTE — Telephone Encounter (Signed)
Pt saw you yesterday and is c/o back pain and would like to know if she can get something for the pain. She is awaiting her MRI and has another PT appointment today. Please assist.

## 2015-09-23 NOTE — Telephone Encounter (Signed)
Pt.notified

## 2015-09-28 ENCOUNTER — Ambulatory Visit (INDEPENDENT_AMBULATORY_CARE_PROVIDER_SITE_OTHER): Payer: 59 | Admitting: Physical Therapy

## 2015-09-28 DIAGNOSIS — M545 Low back pain, unspecified: Secondary | ICD-10-CM

## 2015-09-28 DIAGNOSIS — M6281 Muscle weakness (generalized): Secondary | ICD-10-CM

## 2015-09-28 DIAGNOSIS — R29898 Other symptoms and signs involving the musculoskeletal system: Secondary | ICD-10-CM

## 2015-09-28 DIAGNOSIS — R198 Other specified symptoms and signs involving the digestive system and abdomen: Secondary | ICD-10-CM

## 2015-09-28 NOTE — Therapy (Addendum)
[facility], [place], [address] Phone: [phone]   Fax:  [phone]  Physical Therapy Treatment  Patient Details  Name: Regina [name] MRN: [id number] Date of Birth: 01/14/1976 Referring Provider: Dr. Helane Rima   Encounter Date: 09/28/2015      PT End of Session - 09/28/15 1405    Visit Number 3   Number of Visits 12   Date for PT Re-Evaluation 10/19/15   PT Start Time 1497   PT Stop Time 1442   PT Time Calculation (min) 38 min   Activity Tolerance Patient tolerated treatment well      Past Medical History  Diagnosis Date  . Migraine   . Depression with anxiety   . Panic disorder   . Morbid obesity [facility])     Past Surgical History  Procedure Laterality Date  . Soft tissue tumor resection  06/17/07    chest/ wound reclosure 4/09'  . Dilation and curettage, diagnostic / therapeutic    . 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      Subjective Assessment - 09/28/15 1403    Subjective Saw her MD last week with a bad migraine and had increased pain into her Rt LE after the appointment for about 2 days. Seh rested alot and it settled down. Lumbar pillow in her car helps and a wedge under her knees with sleeping.    Currently in Pain? Yes   Pain Score 2    Pain Location Back   Pain Orientation Left;Right;Lower   Pain Descriptors / Indicators Tightness;Tender   Pain Onset More than a month ago   Pain Frequency Constant   Aggravating Factors  not sure, however possibly the weather.    Pain Relieving Factors rest, heat                         [facility] Adult PT Treatment/Exercise - 09/28/15 0001    Exercises   Exercises Lumbar   Lumbar Exercises: Stretches   Active Hamstring Stretch 2 reps;30 seconds  with strap   Single Knee to Chest Stretch 30 seconds   Lumbar Exercises: Aerobic   Stationary Bike NuStep L4: 5.5 min    Lumbar Exercises: Standing   Wall Slides 10 reps  with FWD reach holding 4#    Lumbar Exercises: Supine   Bridge 10 reps;3 seconds   Other Supine Lumbar Exercises 2x10 hooklying, abdom bracing with OH reach holding 6#.    Lumbar Exercises: Prone   Opposite Arm/Leg Raise Left arm/Right leg;Right arm/Left leg;20 reps   Modalities   Modalities Electrical Stimulation;Moist Heat   Moist Heat Therapy   Number Minutes Moist Heat 15 Minutes   Moist Heat Location Lumbar Spine   Electrical Stimulation   Electrical Stimulation Location lumbar and bilat buttocks   Electrical Stimulation Action IFC   Electrical Stimulation Parameters  to tolerance   Electrical Stimulation Goals Tone;Pain                     PT Long Term Goals - 09/28/15 1405    PT LONG TERM GOAL #1   Title I with advanced HEP to include body mechanics ( 10/19/15)    Time 6   Period Weeks   Status On-going   PT LONG TERM GOAL #2   Title perform core strengthening with good stabilization of her pelvis ( 10/19/15)  Time 6   Period Weeks   Status On-going   PT LONG TERM GOAL #3   Title report =/> 75% reduction of pain in her low back ( 10/19/15)    Time 6   Period Weeks   Status On-going   PT LONG TERM GOAL #4   Title return to her prior level of sitting and walking without difficulty ( 10/19/15)    Time 6   Period Weeks   Status On-going   PT LONG TERM GOAL #5   Title improve FOTO =/< 35% limited ( 10/19/15)    Time 6   Period Weeks   Status On-going               Plan - 09/28/15 1424    Clinical Impression Statement This is Shuronda's second visit since returning for PT, she is very weak through her core with decreased spinal stability.  Her pain has reduced from last week and is no longer into her LE.  No goals met.    Rehab Potential Good   PT Frequency 2x / week   PT Duration 6 weeks   PT Treatment/Interventions Ultrasound;Neuromuscular re-education;Patient/family  education;Cryotherapy;Electrical Stimulation;Moist Heat;Therapeutic exercise;Manual techniques;Taping;Dry needling   PT Next Visit Plan manual therapy to her low back/buttocks, review transverse abdominal exercises.  Modalities as indicated.    Consulted and Agree with Plan of Care Patient      Patient will benefit from skilled therapeutic intervention in order to improve the following deficits and impairments:  Postural dysfunction, Decreased strength, Pain, Increased muscle spasms  Visit Diagnosis: Bilateral low back pain without sciatica  Weakness of back  Abdominal weakness     Problem List Patient Active Problem List   Diagnosis Date Noted  . Low back pain 07/22/2015  . Excessive daytime sleepiness 10/28/2014  . Left cervical radiculopathy 04/02/2014  . Insomnia 12/01/2013  . Migraine without aura 08/04/2013  . Anxiety and depression 03/25/2013  . Hyperlipidemia 03/11/2012  . Preventive measure 03/10/2012  . Obesity 03/10/2012  . TERATOMA 03/07/2010    Jeral Pinch PT  09/28/2015, 2:26 PM  [facility] [place], [place], [address] Phone: [phone]   Fax:  [phone]  Name: ARYIANNA EARWOOD MRN: [id number] Date of Birth: 1976-03-22    PHYSICAL THERAPY DISCHARGE SUMMARY  Visits from Start of Care: 3  Current functional level related to goals / functional outcomes: unknown   Remaining deficits: unknown   Education / Equipment: HEP Plan: Patient agrees to discharge.  Patient goals were not met. Patient is being discharged due to the patient's request.  ?????    Jeral Pinch, PT 10/25/2015 8:19 AM

## 2015-09-30 ENCOUNTER — Encounter: Payer: 59 | Admitting: Physical Therapy

## 2015-10-17 ENCOUNTER — Ambulatory Visit (INDEPENDENT_AMBULATORY_CARE_PROVIDER_SITE_OTHER): Payer: 59

## 2015-10-17 DIAGNOSIS — M545 Low back pain, unspecified: Secondary | ICD-10-CM

## 2015-10-17 DIAGNOSIS — R200 Anesthesia of skin: Secondary | ICD-10-CM

## 2015-10-17 DIAGNOSIS — M79605 Pain in left leg: Secondary | ICD-10-CM

## 2015-10-19 ENCOUNTER — Emergency Department (HOSPITAL_COMMUNITY): Payer: 59

## 2015-10-19 ENCOUNTER — Encounter (HOSPITAL_COMMUNITY): Payer: Self-pay

## 2015-10-19 ENCOUNTER — Emergency Department (HOSPITAL_COMMUNITY)
Admission: EM | Admit: 2015-10-19 | Discharge: 2015-10-20 | Disposition: A | Discharge status: Other Acute Inpatient Hospital | Payer: 59 | Attending: Emergency Medicine | Admitting: Emergency Medicine

## 2015-10-19 ENCOUNTER — Other Ambulatory Visit: Payer: Self-pay

## 2015-10-19 DIAGNOSIS — R569 Unspecified convulsions: Secondary | ICD-10-CM | POA: Diagnosis not present

## 2015-10-19 DIAGNOSIS — T50905A Adverse effect of unspecified drugs, medicaments and biological substances, initial encounter: Secondary | ICD-10-CM

## 2015-10-19 DIAGNOSIS — F418 Other specified anxiety disorders: Secondary | ICD-10-CM | POA: Diagnosis not present

## 2015-10-19 DIAGNOSIS — Y636 Underdosing and nonadministration of necessary drug, medicament or biological substance: Secondary | ICD-10-CM | POA: Insufficient documentation

## 2015-10-19 DIAGNOSIS — Z79899 Other long term (current) drug therapy: Secondary | ICD-10-CM | POA: Diagnosis not present

## 2015-10-19 DIAGNOSIS — T404X5A Adverse effect of other synthetic narcotics, initial encounter: Secondary | ICD-10-CM | POA: Diagnosis not present

## 2015-10-19 DIAGNOSIS — T450X5A Adverse effect of antiallergic and antiemetic drugs, initial encounter: Secondary | ICD-10-CM | POA: Insufficient documentation

## 2015-10-19 DIAGNOSIS — T887XXA Unspecified adverse effect of drug or medicament, initial encounter: Secondary | ICD-10-CM | POA: Diagnosis not present

## 2015-10-19 DIAGNOSIS — T404X4A Poisoning by other synthetic narcotics, undetermined, initial encounter: Secondary | ICD-10-CM | POA: Insufficient documentation

## 2015-10-19 DIAGNOSIS — G40909 Epilepsy, unspecified, not intractable, without status epilepticus: Secondary | ICD-10-CM | POA: Diagnosis present

## 2015-10-19 DIAGNOSIS — T50904A Poisoning by unspecified drugs, medicaments and biological substances, undetermined, initial encounter: Secondary | ICD-10-CM

## 2015-10-19 LAB — BASIC METABOLIC PANEL
ANION GAP: 8 (ref 5–15)
BUN: 7 mg/dL (ref 6–20)
CALCIUM: 8.6 mg/dL — AB (ref 8.9–10.3)
CO2: 22 mmol/L (ref 22–32)
Chloride: 106 mmol/L (ref 101–111)
Creatinine, Ser: 0.75 mg/dL (ref 0.44–1.00)
Glucose, Bld: 112 mg/dL — ABNORMAL HIGH (ref 65–99)
POTASSIUM: 3.8 mmol/L (ref 3.5–5.1)
Sodium: 136 mmol/L (ref 135–145)

## 2015-10-19 LAB — CBC
HCT: 38.3 % (ref 36.0–46.0)
HEMOGLOBIN: 12.8 g/dL (ref 12.0–15.0)
MCH: 27.3 pg (ref 26.0–34.0)
MCHC: 33.4 g/dL (ref 30.0–36.0)
MCV: 81.7 fL (ref 78.0–100.0)
Platelets: 256 10*3/uL (ref 150–400)
RBC: 4.69 MIL/uL (ref 3.87–5.11)
RDW: 12.5 % (ref 11.5–15.5)
WBC: 9.1 10*3/uL (ref 4.0–10.5)

## 2015-10-19 LAB — RAPID URINE DRUG SCREEN, HOSP PERFORMED
Amphetamines: NOT DETECTED
BARBITURATES: NOT DETECTED
Benzodiazepines: NOT DETECTED
Cocaine: NOT DETECTED
Opiates: NOT DETECTED
Tetrahydrocannabinol: NOT DETECTED

## 2015-10-19 LAB — ETHANOL

## 2015-10-19 LAB — SALICYLATE LEVEL: Salicylate Lvl: <4 mg/dL (ref 2.8–30.0)

## 2015-10-19 LAB — ACETAMINOPHEN LEVEL

## 2015-10-19 MED ORDER — LORAZEPAM 2 MG/ML IJ SOLN
1.0000 mg | INTRAMUSCULAR | Status: AC | PRN
  Administered 2015-10-19 (×2): 1 mg via INTRAVENOUS
  Filled 2015-10-19 (×2): qty 1

## 2015-10-19 MED ORDER — IBUPROFEN 200 MG PO TABS
200.0000 mg | ORAL_TABLET | Freq: Four times a day (QID) | ORAL | Status: DC | PRN
  Administered 2015-10-19: 200 mg via ORAL
  Filled 2015-10-19: qty 1

## 2015-10-19 MED ORDER — MELATONIN 10 MG PO TABS: 1.0000 | ORAL_TABLET | Freq: Every day | ORAL | Status: DC

## 2015-10-19 MED ORDER — CYCLOBENZAPRINE HCL 10 MG PO TABS
10.0000 mg | ORAL_TABLET | Freq: Three times a day (TID) | ORAL | Status: DC | PRN
  Administered 2015-10-19: 10 mg via ORAL
  Filled 2015-10-19: qty 1

## 2015-10-19 MED ORDER — ONE-DAILY MULTI VITAMINS PO TABS: 1.0000 | ORAL_TABLET | Freq: Every day | ORAL | Status: DC

## 2015-10-19 MED ORDER — TRAZODONE HCL 50 MG PO TABS
150.0000 mg | ORAL_TABLET | Freq: Every day | ORAL | Status: DC
  Administered 2015-10-19: 150 mg via ORAL
  Filled 2015-10-19: qty 1

## 2015-10-19 MED ORDER — OXYCODONE-ACETAMINOPHEN 5-325 MG PO TABS
2.0000 | ORAL_TABLET | Freq: Once | ORAL | Status: AC
  Administered 2015-10-19: 2 via ORAL
  Filled 2015-10-19: qty 2

## 2015-10-19 MED ORDER — METHOCARBAMOL 500 MG PO TABS
1000.0000 mg | ORAL_TABLET | Freq: Once | ORAL | Status: AC
  Administered 2015-10-19: 1000 mg via ORAL
  Filled 2015-10-19: qty 2

## 2015-10-19 MED ORDER — ADULT MULTIVITAMIN W/MINERALS CH: 1.0000 | ORAL_TABLET | Freq: Every day | ORAL | Status: DC

## 2015-10-19 MED ORDER — TOPIRAMATE 25 MG PO TABS: 200.0000 mg | ORAL_TABLET | Freq: Every day | ORAL | Status: DC

## 2015-10-19 MED ORDER — CITALOPRAM HYDROBROMIDE 10 MG PO TABS: 40.0000 mg | ORAL_TABLET | Freq: Every day | ORAL | Status: DC

## 2015-10-19 NOTE — ED Notes (Signed)
Pt brought in EMS for new onset seizures.  Husband reports he found wife on floor having grand mal seizure and turning blue.  Husband placed pt in recovery position and pt began breathing and "coming back around".  Per husband pt has chronic back pain and [place] h[place].  Pt reported to EMS that she recently [place]s [place] but is unable to tell us what the medication is or the strength.  Pt reports she has been taking them for aprox. 1 week.  Pt had two episodes of projectile vomiting and was given 4mg  Zofran by EMS.  Pt had one episode of vomiting upon arrival.

## 2015-10-19 NOTE — ED Notes (Signed)
Pt has taken herself off of continuous monitoring.

## 2015-10-19 NOTE — ED Provider Notes (Signed)
CSN: [id number]     Arrival date & time 10/19/15  1041 History   First MD Initiated Contact with Patient 10/19/15 1059     Chief Complaint  Patient presents with  . Seizures      HPI  Patient presents for evaluation after a seizure at home. She has a history of chronic back pain. She states her primary care physician will not prescribe her pain medications. She states that on her last visit he declined. She has been getting Ultram over the [place] from [place] she [place] 12 this morning. She had a witnessed grand mal seizure while sitting in his urine for over 3 infant children. One of them awakened her husband who is sleeping after a night shift. He found her in the grips a generalized seizure. Did not bite her tongue or have aspiration. He placed her on the floor in recovery position. She was postictal upon arrival of paramedics and in route.  Husband states that the had" intervention" between her, her primary care physician, and himself after she overdosed with Ativan and pain medication several months ago. Thus her primary care physician is not prescribing controlled substances. No history of seizure disorder. Asymptomatic here other than "back pain".  Past Medical History  Diagnosis Date  . Migraine   . Depression with anxiety   . Panic disorder   . Morbid obesity ([place])   . Depression   . Seizures [name])    Past Surgical History  Procedure Laterality Date  . Soft tissue tumor resection  06/17/07    chest/ wound reclosure 4/09'  . Dilation and curettage, diagnostic / therapeutic    . 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 Hypertension Mother   . Migraines Mother   . Depression Father   . Suicidality Father    Social History  Substance Use Topics  . Smoking status: Never Smoker   . Smokeless tobacco: None  . Alcohol Use: No   OB History    Gravida Para Term Preterm AB TAB SAB Ectopic Multiple Living   5 3 3   0      3     Review of Systems  Constitutional: Negative for fever, chills, diaphoresis, appetite change and fatigue.  HENT: Negative for mouth sores, sore throat and trouble swallowing.   Eyes: Negative for visual disturbance.  Respiratory: Negative for cough, chest tightness, shortness of breath and wheezing.   Cardiovascular: Negative for chest pain.  Gastrointestinal: Negative for nausea, vomiting, abdominal pain, diarrhea and abdominal distention.  Endocrine: Negative for polydipsia, polyphagia and polyuria.  Genitourinary: Negative for dysuria, frequency and hematuria.  Musculoskeletal: Positive for back pain. Negative for gait problem.  Skin: Negative for color change, pallor and rash.  Neurological: Positive for seizures. Negative for dizziness, syncope, light-headedness and headaches.  Hematological: Does not bruise/bleed easily.  Psychiatric/Behavioral: Negative for behavioral problems and confusion.      Allergies  Codeine and Sumatriptan  Home Medications   Prior to Admission medications   Medication Sig Start Date End Date Taking? Authorizing Provider  citalopram (CELEXA) 40 MG tablet Take 1 tablet (40 mg total) by mouth daily. 06/21/15  Yes Silverio Decamp, MD  cyclobenzaprine (FLEXERIL) 10 MG tablet Take 1 tablet (10 mg total) by mouth 3 (three) times daily as needed for muscle spasms. 09/23/15  Yes Silverio Decamp, MD  ibuprofen (ADVIL,MOTRIN) 200 MG tablet Take 200 mg by mouth every 6 (  six) hours as needed for moderate pain.   Yes Historical Provider, MD  Melatonin 10 MG TABS Take 1 tablet by mouth at bedtime.   Yes Historical Provider, MD  Multiple Vitamin (MULTIVITAMIN) tablet Take 1 tablet by mouth daily.   Yes Historical Provider, MD  rizatriptan (MAXALT) 10 MG tablet TAKE 1 TABLET (10 MG TOTAL) BY MOUTH AS NEEDED FOR MIGRAINE. MAY REPEAT IN 2 HOURS IF NEEDED 09/20/15  Yes Silverio Decamp, MD  traZODone (DESYREL) 150 MG tablet Take 1 tablet (150 mg  total) by mouth at bedtime. 06/21/15  Yes Silverio Decamp, MD  ALPRAZolam Duanne Moron) 0.5 MG tablet TAKE 1 TABLET BY MOUTH 3 TIMES A DAY Patient not taking: Reported on 10/19/2015 08/23/15   Silverio Decamp, MD  ARIPiprazole (ABILIFY) 5 MG tablet Take 1 tablet (5 mg total) by mouth daily. Patient not taking: Reported on 10/19/2015 09/22/15   Silverio Decamp, MD  buPROPion (WELLBUTRIN XL) 150 MG 24 hr tablet TAKE 1 TABLET (150 MG TOTAL) BY MOUTH EVERY MORNING. 08/04/15   Historical Provider, MD  hydrOXYzine (ATARAX/VISTARIL) 50 MG tablet Take 1-2 tablets (50-100 mg total) by mouth 3 (three) times daily as needed. Patient not taking: Reported on 10/19/2015 06/07/14   Marcial Pacas, DO  topiramate (TOPAMAX) 200 MG tablet Take 1 tablet (200 mg total) by mouth daily. Patient not taking: Reported on 10/19/2015 12/01/13   Olegario Messier, NP  traMADol (ULTRAM) 50 MG tablet Take 1-2 tablets (50-100 mg total) by mouth 3 (three) times daily as needed. 09/23/15   Silverio Decamp, MD   BP 106/65 mmHg  Pulse 88  Temp(Src) 97.6 F (36.4 C) (Oral)  Resp 20  Ht 5\' 2"  (1.575 m)  Wt 245 lb (111.131 kg)  BMI 44.80 kg/m2  SpO2 98%  LMP 10/06/2015 (Exact Date) Physical Exam  Constitutional: She is oriented to person, place, and time. She appears well-developed and well-nourished. No distress.  HENT:  Head: Normocephalic.  Eyes: Conjunctivae are normal. Pupils are equal, round, and reactive to light. No scleral icterus.  Neck: Normal range of motion. Neck supple. No thyromegaly present.  Cardiovascular: Normal rate and regular rhythm.  Exam reveals no gallop and no friction rub.   No murmur heard. Pulmonary/Chest: Effort normal and breath sounds normal. No respiratory distress. She has no wheezes. She has no rales.  Abdominal: Soft. Bowel sounds are normal. She exhibits no distension. There is no tenderness. There is no rebound.  Musculoskeletal: Normal range of motion.  Neurological: She is alert  and oriented to person, place, and time. She has normal strength. No cranial nerve deficit or sensory deficit.  Normal symmetric Strength to shoulder shrug, triceps, biceps, grip,wrist flex/extend,and intrinsics  Norma lsymmetric sensation above and below clavicles, and to all distributions to UEs. Norma symmetric strength to flex/.extend hip and knees, dorsi/plantar flex ankles. Normal symmetric sensation to all distributions to LEs Patellar and achilles reflexes 1-2+. Downgoing Babinski   Skin: Skin is warm and dry. No rash noted.  Psychiatric: She has a normal mood and affect. Her behavior is normal.    ED Course  Procedures (including critical care time) Labs Review Labs Reviewed  BASIC METABOLIC PANEL - Abnormal; Notable for the following:    Glucose, Bld 112 (*)    Calcium 8.6 (*)    All other components within normal limits  ACETAMINOPHEN LEVEL - Abnormal; Notable for the following:    Acetaminophen (Tylenol), Serum <10 (*)    All other components within  normal limits  CBC  ETHANOL  SALICYLATE LEVEL  URINE RAPID DRUG SCREEN, HOSP PERFORMED    Imaging Review Dg Chest Port 1 View  10/19/2015  CLINICAL DATA:  New onset seizures. EXAM: PORTABLE CHEST 1 VIEW COMPARISON:  03/25/2013 FINDINGS: Prior CABG. Heart is borderline in size. Mild peribronchial thickening. No confluent opacities or effusions. No acute bony abnormality. IMPRESSION: Mild bronchitic changes. Electronically Signed   By: Rolm Baptise M.D.   On: 10/19/2015 12:34   I have personally reviewed and evaluated these images and lab results as part of my medical decision-making.   EKG Interpretation   Date/Time:  Wednesday October 19 2015 10:54:11 EDT Ventricular Rate:  110 PR Interval:    QRS Duration: 109 QT Interval:  348 QTC Calculation: 471 R Axis:   23 Text Interpretation:  Sinus tachycardia Probable left atrial enlargement  Low voltage, precordial leads RSR' in V1 or V2, right VCD or RVH Baseline  wander  in lead(s) III aVL aVF Confirmed by Jeneen Rinks  MD, Ages ([id number]) on  10/19/2015 11:02:19 AM      MDM   Final diagnoses:  Medication side effect, initial encounter  Overdose of medication, undetermined intent, initial encounter    Patient is medically clear at this point. Recommendation is that she receive Ativan 1 mg by mouth 3 times a day today and tomorrow. Should never take tramadol secondary to her seizure induced by tramadol today.  Patient except above for [facility]. Patient attempted to flee emergency room. Placed on IVC by myself.    Tanna Furry, MD 10/20/15 [phone]

## 2015-10-19 NOTE — ED Notes (Signed)
Pt complains of difficulty sleeping.

## 2015-10-19 NOTE — ED Notes (Signed)
Family in room, pt attempted to void on bed pan could not, TTS being done

## 2015-10-19 NOTE — ED Notes (Signed)
Patient is speaking now with Behavior Health Counselor via telepsych machine; visitors at bedside

## 2015-10-19 NOTE — ED Notes (Signed)
Myself and Jenny Reichmann, RN placed patient on bedpan; patient was unable to urinate at this time; visitors had stepped outside the door for privacy

## 2015-10-19 NOTE — ED Notes (Signed)
Pt given  Paper scrubs. Belongings given to family.

## 2015-10-19 NOTE — Progress Notes (Signed)
This Probation officer completed a chart review for disposition.  This Probation officer will check back for notes clarifying if the patient is medically cleared.   Chesley Noon, MSW, Darlyn Read [facility] Triage Specialist [phone]

## 2015-10-19 NOTE — BH Assessment (Addendum)
  Regina Boga, DNP recommended INPT treatment. Patient is appropriate for a 300 hall bed at [facility].

## 2015-10-19 NOTE — ED Notes (Addendum)
Pt admits to taking 6 100 mg tramdol at 9 am today , again denies wanting to hurt herself, per husband they had an intervention in feb when she OD on ativan and [place], that she had ordered online,and husband found her in bed and she had pooped everywhere . Her PCP was aware of this. This time  per husband he had come home from work and had gone to sleep when she had shaking  episode while sitting in recliner and his kids ran upstairs to get him, pt was blue, sz was tonic clonic. He placed her in recovery position and pt started to breathe again. He called 911

## 2015-10-19 NOTE — ED Notes (Signed)
Patient undressed, in gown, on monitor, continuous pulse oximetry and blood pressure cuff; visitor at bedside 

## 2015-10-19 NOTE — ED Notes (Signed)
No sz at this time,  As pt was rolled into room pt had projectile vomiting large amt. Husband at bedside pt denies trying to hurt herself states med ordered  Was tramdol

## 2015-10-19 NOTE — ED Notes (Signed)
Diet order placed for patient. Pt. Only ordered mash potatoes and peach Crisp, apple juice.

## 2015-10-19 NOTE — ED Notes (Signed)
Pt upset about not having a bed yet. Pt threatening to leave. Also c.o. Back pain. MD Jeneen Rinks notified and orders obtained.

## 2015-10-19 NOTE — BH Assessment (Addendum)
Assessment Note  Regina [name] is an 40 y.o. female. Patient presents to [facility] via EMS for medical clearance. Per ED notes, "Pt brought in EMS for new onset seizures. Husband reports he found wife on floor having grand mal seizure and turning blue. Husband placed pt in recovery position and pt began breathing and "coming back around"." Writer met with patient who admits to taking #6 100 mg tramdol at 9 am this morning (12 hour period of time). Patient is prescribed Tramadol however ran out several weeks ago. Sts [place] that she [place]. Patient has been taking the medications ordered from [place] approximately 1 week.  Patient admits that she has ordered various medications from websites in the past. Patient denies this was a suicide attempt. Denies history of suicide attemps. Sts that she took the pills to relieve back pain. She admits that she has taken Tramadol for "many yrs". She takes #2-3 pills of Tramadol almost every day or when having "pain spells". Writer asked patient if this has happened in the past and she stated, "Yes". Patient describes a similar episode in February 2017. Sts that she overdosed at that time and was found unconscious (no seizure). Patient overdosed at that time on Ambien and Ativan. She does admit to depression. She reports loss of interest in usual pleasures and guilt. Appetite is good with 30 pounds of weight gain in the past 1.5 year. She sleeps well (8hrs per night). Patient denies HI and AVH's. Patient has no history of INPT mental health or substance abuse treatment. She does not have a psychiatrist or therapist.   .  Diagnosis:   Major Depressive Disorder, Recurrent, Severe, without psychotic features; Anxiety Disorder, Substance Use Disorder (Tramadol)   Past Medical History:  Past Medical History  Diagnosis Date  . Migraine   . Depression with anxiety   . Panic disorder   . Morbid obesity [facility])     Past  Surgical History  Procedure Laterality Date  . Soft tissue tumor resection  06/17/07    chest/ wound reclosure 4/09'  . Dilation and curettage, diagnostic / therapeutic    . 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 Hypertension Mother   . Migraines Mother   . Depression Father   . Suicidality Father     Social History:  reports that she has never smoked. She does not have any smokeless tobacco history on file. She reports that she does not drink alcohol or use illicit drugs.  Additional Social History:  Alcohol / Drug Use Pain Medications: SEE MAR Prescriptions: SEE MAR Over the Counter: SEE MAR History of alcohol / drug use?: Yes Substance #1 Name of Substance 1: Tramadal  1 - Age of First Use: "Several Yrs" 1 - Amount (size/oz): Varies: 2 to 3 pills daily ..."somtimes more" 1 - Frequency: Varies; "Mostly when having back pain"; "Sometimes I may take every day..Depends" 1 - Duration: daily  1 - Last Use / Amount: 10/19/2015; 6 pills   CIWA: CIWA-Ar BP: 130/89 mmHg Pulse Rate: 110 COWS:    Allergies:  Allergies  Allergen Reactions  . Codeine Itching  . Sumatriptan     Palpitations    Home Medications:  (Not in a hospital admission)  OB/GYN Status:  Patient's last menstrual period was 10/06/2015 (exact date).  General Assessment Data Location of Assessment: [facility] ED TTS Assessment:  In system Is this a Tele or Face-to-Face Assessment?: Face-to-Face Is this an Initial Assessment or a Re-assessment for this encounter?: Initial Assessment Marital status: Married [place] name:  (n/a) Is patient pregnant?: No Pregnancy Status: No Living Arrangements: Spouse/significant other, Children Can pt return to current living arrangement?: Yes Admission Status: Voluntary Is patient capable of signing voluntary admission?: Yes Referral Source: Self/Family/Friend Insurance type:  Pharmacologist)     Crisis Care  Plan Living Arrangements: Spouse/significant other, Children Legal Guardian: Other: (no legal guardian ) Name of Psychiatrist:  (No psychiatrist ) Name of Therapist:  (No therapist )  Education Status Is patient currently in school?: No Current Grade:  (n/a) Highest grade of school patient has completed:  (Masters Degree) Name of school:  (n/a) Contact person:  (n/a)  Risk to self with the past 6 months Suicidal Ideation: No Has patient been a risk to self within the past 6 months prior to admission? : No Suicidal Intent: No Has patient had any suicidal intent within the past 6 months prior to admission? : No Is patient at risk for suicide?: No Suicidal Plan?: No Has patient had any suicidal plan within the past 6 months prior to admission? 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 : No Thoughts of Harm to Others: No Current Homicidal Intent: No Current Homicidal Plan: No Access to  Homicidal Means: No Identified Victim:  (n/a) History of harm to others?: No Assessment of Violence: None Noted Violent Behavior Description:  (calm and cooperative ) Does patient have access to weapons?: No Criminal Charges Pending?: No Does patient have a court date: No Is patient on probation?: No  Psychosis Hallucinations: None noted Delusions: None noted  Mental Status Report Appearance/Hygiene: Disheveled Eye Contact: Good Motor Activity: Freedom of movement Speech: Logical/coherent Level of Consciousness: Alert Mood: Depressed Affect: Appropriate to circumstance Anxiety Level: None Thought Processes: Relevant, Coherent Judgement: Impaired Orientation: Person, Place, Time, Situation Obsessive Compulsive Thoughts/Behaviors: None  Cognitive Functioning Concentration: Normal Memory: Remote Intact, Recent Intact IQ: Average Insight: Poor Impulse Control: Poor Appetite: Good Weight Loss:  (n/a) Weight Gain:  (yes) Sleep: No Change (varies .Marland KitchenMarland Kitchen) Total Hours of Sleep:  (7 to 8 hrs per night ) Vegetative Symptoms: None  ADLScreening [facility] Assessment Services) Patient's cognitive ability adequate to safely complete daily activities?: Yes Patient able to express need for assistance with ADLs?: Yes Independently performs ADLs?: Yes (appropriate for developmental age)  Prior Inpatient Therapy Prior Inpatient Therapy: No Prior Therapy Dates:  (n/a) Prior Therapy Facilty/Provider(s):  (n/a) Reason for Treatment:  (n/a)  Prior Outpatient Therapy Prior Outpatient Therapy: No Prior Therapy Dates:  (n/a) Prior Therapy Facilty/Provider(s):  (n/a) Reason  for Treatment:  (n/a) Does patient have an ACCT team?: No Does patient have Intensive In-House Services?  : No Does patient have [facility] services? : No Does patient have P4CC services?: No  ADL Screening (condition at time of admission) Patient's cognitive ability adequate to safely complete daily activities?: Yes Is  the patient deaf or have difficulty hearing?: No Does the patient have difficulty seeing, even when wearing glasses/contacts?: No Does the patient have difficulty concentrating, remembering, or making decisions?: No Patient able to express need for assistance with ADLs?: Yes Does the patient have difficulty dressing or bathing?: No Independently performs ADLs?: Yes (appropriate for developmental age) Does the patient have difficulty walking or climbing stairs?: No Weakness of Legs: None Weakness of Arms/Hands: None  Home Assistive Devices/Equipment Home Assistive Devices/Equipment: None    Abuse/Neglect Assessment (Assessment to be complete while patient is alone) Physical Abuse: Denies Verbal Abuse: Denies Sexual Abuse: Denies Exploitation of patient/patient's resources: Denies Self-Neglect: Denies Values / Beliefs Cultural Requests During Hospitalization: None Spiritual Requests During Hospitalization: None   Advance Directives (For Healthcare) Does patient have an advance directive?: No Would patient like information on creating an advanced directive?: No - patient declined information Nutrition Screen- [facility] Patient's home diet: Regular  Additional Information 1:1 In Past 12 Months?: No CIRT Risk: No Elopement Risk: No Does patient have medical clearance?: Yes     Disposition:  Disposition Initial Assessment Completed for this Encounter: Yes Disposition of Patient: Inpatient treatment program (Patient meets criteria for INPT treatment)  On Site Evaluation by:   Reviewed with Physician:    Evangeline Gula 10/19/2015 1:10 PM

## 2015-10-20 ENCOUNTER — Encounter (HOSPITAL_COMMUNITY): Payer: Self-pay | Admitting: *Deleted

## 2015-10-20 ENCOUNTER — Inpatient Hospital Stay (HOSPITAL_COMMUNITY)
Admission: EM | Admit: 2015-10-20 | Discharge: 2015-10-24 | DRG: 885 | Disposition: A | Discharge status: Home / Self Care | Payer: 59 | Source: Intra-hospital | Attending: Psychiatry | Admitting: Psychiatry

## 2015-10-20 DIAGNOSIS — F329 Major depressive disorder, single episode, unspecified: Secondary | ICD-10-CM | POA: Diagnosis present

## 2015-10-20 DIAGNOSIS — G894 Chronic pain syndrome: Secondary | ICD-10-CM | POA: Diagnosis present

## 2015-10-20 DIAGNOSIS — F332 Major depressive disorder, recurrent severe without psychotic features: Principal | ICD-10-CM | POA: Diagnosis present

## 2015-10-20 DIAGNOSIS — F1120 Opioid dependence, uncomplicated: Secondary | ICD-10-CM | POA: Diagnosis not present

## 2015-10-20 DIAGNOSIS — Z6841 Body Mass Index (BMI) 40.0 and over, adult: Secondary | ICD-10-CM

## 2015-10-20 DIAGNOSIS — G8929 Other chronic pain: Secondary | ICD-10-CM | POA: Diagnosis present

## 2015-10-20 HISTORY — DX: Depression, unspecified: F32.A

## 2015-10-20 HISTORY — DX: Unspecified convulsions: R56.9

## 2015-10-20 HISTORY — DX: Major depressive disorder, single episode, unspecified: F32.9

## 2015-10-20 LAB — TSH: TSH: 1.579 u[IU]/mL (ref 0.350–4.500)

## 2015-10-20 MED ORDER — ASPIRIN-ACETAMINOPHEN-CAFFEINE 250-250-65 MG PO TABS
1.0000 | ORAL_TABLET | Freq: Four times a day (QID) | ORAL | Status: DC | PRN
  Administered 2015-10-20 – 2015-10-22 (×5): 1 via ORAL
  Filled 2015-10-20 (×3): qty 1

## 2015-10-20 MED ORDER — TRAZODONE HCL 150 MG PO TABS
150.0000 mg | ORAL_TABLET | Freq: Every day | ORAL | Status: DC
  Administered 2015-10-20 – 2015-10-23 (×4): 150 mg via ORAL
  Filled 2015-10-20 (×5): qty 1

## 2015-10-20 MED ORDER — CITALOPRAM HYDROBROMIDE 40 MG PO TABS
40.0000 mg | ORAL_TABLET | Freq: Every day | ORAL | Status: DC
  Administered 2015-10-20 – 2015-10-24 (×5): 40 mg via ORAL
  Filled 2015-10-20 (×6): qty 1

## 2015-10-20 MED ORDER — MAGNESIUM HYDROXIDE 400 MG/5ML PO SUSP: 30.0000 mL | Freq: Every day | ORAL | Status: DC | PRN

## 2015-10-20 MED ORDER — CYCLOBENZAPRINE HCL 10 MG PO TABS
10.0000 mg | ORAL_TABLET | Freq: Three times a day (TID) | ORAL | Status: DC | PRN
  Administered 2015-10-20 – 2015-10-23 (×10): 10 mg via ORAL
  Filled 2015-10-20 (×10): qty 1

## 2015-10-20 MED ORDER — ACETAMINOPHEN 325 MG PO TABS
650.0000 mg | ORAL_TABLET | Freq: Four times a day (QID) | ORAL | Status: DC | PRN
  Administered 2015-10-20 – 2015-10-23 (×3): 650 mg via ORAL
  Filled 2015-10-20 (×3): qty 2

## 2015-10-20 MED ORDER — HYDROXYZINE HCL 25 MG PO TABS
25.0000 mg | ORAL_TABLET | Freq: Four times a day (QID) | ORAL | Status: DC | PRN
  Administered 2015-10-20: 25 mg via ORAL
  Filled 2015-10-20: qty 1

## 2015-10-20 MED ORDER — ALUM & MAG HYDROXIDE-SIMETH 200-200-20 MG/5ML PO SUSP: 30.0000 mL | ORAL | Status: DC | PRN

## 2015-10-20 NOTE — Tx Team (Signed)
Initial Interdisciplinary Treatment Plan   PATIENT STRESSORS: Health problems Medication change or noncompliance   PATIENT STRENGTHS: Ability for insight Average or above average intelligence Capable of independent living General fund of knowledge Special hobby/interest Supportive family/friends Work skills   PROBLEM LIST: Problem List/Patient Goals Date to be addressed Date deferred Reason deferred Estimated date of resolution  Chronic back pain      Overuse of pain medications      Depression      Risk for self harm            "I don't need to be in a mental hospital; I am not suicidal"      "My primary problem is my back pain"                   DISCHARGE CRITERIA:  Improved stabilization in mood, thinking, and/or behavior Motivation to continue treatment in a less acute level of care Need for constant or close observation no longer present Reduction of life-threatening or endangering symptoms to within safe limits Verbal commitment to aftercare and medication compliance  PRELIMINARY DISCHARGE PLAN: Attend aftercare/continuing care group Outpatient therapy Return to previous living arrangement Return to previous work or school arrangements  PATIENT/FAMIILY INVOLVEMENT: This treatment plan has been presented to and reviewed with the patient, Regina [name], and/or family member.  The patient and family have been given the opportunity to ask questions and make suggestions.  Regina [name] [facility] 10/20/2015, 4:41 AM

## 2015-10-20 NOTE — H&P (Signed)
Psychiatric Admission Assessment Adult  Patient Identification: Regina [name] MRN:  [id number]  Date of Evaluation:  10/20/2015  Chief Complaint:  MDD, opioid overdose  Principal Diagnosis:  MDD (major depressive disorder), recurrent episode, severe ([place])  Diagnosis:   Patient Active Problem List   Diagnosis Date Noted  . MDD (major depressive disorder), recurrent episode, severe ([place]) [F33.2] 10/20/2015  . Low back pain [M54.5] 07/22/2015  . Excessive daytime sleepiness [G47.19] 10/28/2014  . Left cervical radiculopathy [M54.12] 04/02/2014  . Insomnia [G47.00] 12/01/2013  . Migraine without aura [G43.009] 08/04/2013  . Anxiety and depression [F41.8] 03/25/2013  . Hyperlipidemia [E78.5] 03/11/2012  . Preventive measure [Z29.9] 03/10/2012  . Obesity [E66.9] 03/10/2012  . 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 Admitted to [facility] from the [facility] ED with complaints of seizure activities & suspected drug overdose. During this assessment, Regina [name] reports, "I had a seizure activity yesterday. Prior to having the seizure activity, I was in a lot of pain. I have  chronic back pain. So, I took some tramadol tablets, (6 of100 mg tablets) within 12 hours. It caused me to have the seizure. My children found me on the floor, they called my husband who called the ambulance. The ambulance took me to the ED. I was basically monitored at the ED. I don't have seizure disorder. I don't abuse drugs. I don't have alcohol problems & I don't smoke cigarettes. I'm not depressed, but I was diagnosed with Anxiety disorder a long time ago & I take SSRI for it.  I'm not suicidal. I have never attempted suicide. I don't hear voices, but I sleep poorly at night. I will need something to help me sleep at night. My doctor is [place], [place]".  Associated Signs/Symptoms:  Depression Symptoms:  insomnia,  (Hypo) Manic  Symptoms:  Impulsivity,  Anxiety Symptoms:  Excessive Worry, high anxiety levels.  Psychotic Symptoms:  Denies any hallucinations, delusions or paranoia.  PTSD Symptoms: Denies any PTSD symptoms, however, says father commited suicide when she was 10)  Total Time spent with patient: 1 hour  Past Psychiatric History: Anxiety disorder  Is the patient at risk to self? No. (denies) Has the patient been a risk to self in the past 6 months? No. (Denies) Has the patient been a risk to self within the distant past? No. (Denies) Is the patient a risk to others? No.  Has the patient been a risk to others in the past 6 months? No.  Has the patient been a risk to others within the distant past? No.   Prior Inpatient Therapy: Denies Prior Outpatient Therapy: Denies  Alcohol Screening: 1. How often do you have a drink containing alcohol?: Never 9. Have you or someone else been injured as a result of your drinking?: No 10. Has a relative or friend or a doctor or another health worker been concerned about your drinking or suggested you cut down?: No Alcohol Use Disorder Identification Test Final Score (AUDIT): 0 Brief Intervention: AUDIT score less than 7 or less-screening does not suggest unhealthy drinking-brief intervention not indicated  Substance Abuse History in the last 12 months:  Yes.    Consequences of Substance Abuse: Medical Consequences:  Liver damage, Possible death by overdose Legal Consequences:  Arrests, jail time, Loss of driving privilege. Family Consequences:  Family discord, divorce and or separation.  Previous Psychotropic Medications: Yes, (Citalopram 40 mg).  Psychological Evaluations: Yes  Past Medical History:  Past Medical History  Diagnosis Date  . Migraine   . Depression with anxiety   . Panic disorder   . Morbid obesity ([place])   . Depression   . Seizures [facility])     Past Surgical History  Procedure Laterality Date  . Soft tissue tumor resection   06/17/07    chest/ wound reclosure 4/09'  . Dilation and curettage, diagnostic / therapeutic    . 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 Hypertension Mother   . Migraines Mother   . Depression Father   . Suicidality Father    Family Psychiatric  History: PTSD: Mother (witnessed father commit suicide).                                                      Suicide: Father  Tobacco Screening: Denies Cigarette smoking or use of tobacco products.  Social History:  History  Alcohol Use No     History  Drug Use No    Additional Social History: Pain Medications: See home med list Prescriptions: See home med list Over the Counter: See home med list History of alcohol / drug use?: No history of alcohol / drug abuse (Pt has overused her pain meds) Longest period of sobriety (when/how long): n/a  Allergies:   Allergies  Allergen Reactions  . Codeine Itching  . Sumatriptan     Palpitations   Lab Results:  Results for orders placed or performed during the hospital encounter of 10/19/15 (from the past 48 hour(s))  Basic metabolic panel - if new onset seizures     Status: Abnormal   Collection Time: 10/19/15 11:02 AM  Result Value Ref Range   Sodium 136 135 - 145 mmol/L   Potassium 3.8 3.5 - 5.1 mmol/L   Chloride 106 101 - 111 mmol/L   CO2 22 22 - 32 mmol/L   Glucose, Bld 112 (H) 65 - 99 mg/dL   BUN 7 6 - 20 mg/dL   Creatinine, Ser 0.75 0.44 - 1.00 mg/dL   Calcium 8.6 (L) 8.9 - 10.3 mg/dL   GFR calc non Af Amer >60 >60 mL/min   GFR calc Af Amer >60 >60 mL/min    Comment: (NOTE) The eGFR has been calculated using the CKD EPI equation. This calculation has not been validated in all clinical situations. eGFR's persistently <60 mL/min signify possible Chronic Kidney Disease.    Anion gap 8 5 - 15  CBC - if new onset seizures     Status: None   Collection Time: 10/19/15 11:02 AM  Result Value Ref Range   WBC  9.1 4.0 - 10.5 K/uL   RBC 4.69 3.87 - 5.11 MIL/uL   Hemoglobin 12.8 12.0 - 15.0 g/dL   HCT 38.3 36.0 - 46.0 %   MCV 81.7 78.0 - 100.0 fL   MCH 27.3 26.0 - 34.0 pg   MCHC 33.4 30.0 - 36.0 g/dL   RDW 12.5 11.5 - 15.5 %   Platelets 256 150 - 400 K/uL  Acetaminophen level     Status: Abnormal   Collection Time: 10/19/15 11:02 AM  Result Value Ref Range   Acetaminophen (Tylenol), Serum <10 (L) 10 - 30 ug/mL    Comment:  THERAPEUTIC CONCENTRATIONS VARY SIGNIFICANTLY. A RANGE OF 10-30 ug/mL MAY BE AN EFFECTIVE CONCENTRATION FOR MANY PATIENTS. HOWEVER, SOME ARE BEST TREATED AT CONCENTRATIONS OUTSIDE THIS RANGE. ACETAMINOPHEN CONCENTRATIONS >150 ug/mL AT 4 HOURS AFTER INGESTION AND >50 ug/mL AT 12 HOURS AFTER INGESTION ARE OFTEN ASSOCIATED WITH TOXIC REACTIONS.   Ethanol     Status: None   Collection Time: 10/19/15 11:02 AM  Result Value Ref Range   Alcohol, Ethyl (B) <5 <5 mg/dL    Comment:        LOWEST DETECTABLE LIMIT FOR SERUM ALCOHOL IS 5 mg/dL FOR MEDICAL PURPOSES ONLY REPEATED TO VERIFY   Salicylate level     Status: None   Collection Time: 10/19/15 11:02 AM  Result Value Ref Range   Salicylate Lvl <7.3 2.8 - 30.0 mg/dL  Urine rapid drug screen (hosp performed)     Status: None   Collection Time: 10/19/15  2:03 PM  Result Value Ref Range   Opiates NONE DETECTED NONE DETECTED   Cocaine NONE DETECTED NONE DETECTED   Benzodiazepines NONE DETECTED NONE DETECTED   Amphetamines NONE DETECTED NONE DETECTED   Tetrahydrocannabinol NONE DETECTED NONE DETECTED   Barbiturates NONE DETECTED NONE DETECTED    Comment:        DRUG SCREEN FOR MEDICAL PURPOSES ONLY.  IF CONFIRMATION IS NEEDED FOR ANY PURPOSE, NOTIFY LAB WITHIN 5 DAYS.        LOWEST DETECTABLE LIMITS FOR URINE DRUG SCREEN Drug Class       Cutoff (ng/mL) Amphetamine      1000 Barbiturate      200 Benzodiazepine   710 Tricyclics       626 Opiates          300 Cocaine          300 THC               50    Blood Alcohol level:  Lab Results  Component Value Date   ETH <5 94/85/4627   Metabolic Disorder Labs:  Lab Results  Component Value Date   HGBA1C 5.0 10/26/2014   MPG 97 10/26/2014   MPG 100 03/10/2012   No results found for: PROLACTIN Lab Results  Component Value Date   CHOL 198 10/26/2014   TRIG 125 10/26/2014   HDL 41* 10/26/2014   CHOLHDL 4.8 10/26/2014   VLDL 25 10/26/2014   LDLCALC 132* 10/26/2014   LDLCALC 125* 03/25/2013   Current Medications: Current Facility-Administered Medications  Medication Dose Route Frequency Provider Last Rate Last Dose  . acetaminophen (TYLENOL) tablet 650 mg  650 mg Oral Q6H PRN Laverle Hobby, PA-C      . alum & mag hydroxide-simeth (MAALOX/MYLANTA) 200-200-20 MG/5ML suspension 30 mL  30 mL Oral Q4H PRN Laverle Hobby, PA-C      . citalopram (CELEXA) tablet 40 mg  40 mg Oral Daily Laverle Hobby, PA-C   40 mg at 10/20/15 0830  . cyclobenzaprine (FLEXERIL) tablet 10 mg  10 mg Oral TID PRN Laverle Hobby, PA-C   10 mg at 10/20/15 0402  . hydrOXYzine (ATARAX/VISTARIL) tablet 25 mg  25 mg Oral Q6H PRN Laverle Hobby, PA-C      . magnesium hydroxide (MILK OF MAGNESIA) suspension 30 mL  30 mL Oral Daily PRN Laverle Hobby, PA-C      . traZODone (DESYREL) tablet 150 mg  150 mg Oral QHS Laverle Hobby, PA-C       PTA Medications: Prescriptions prior to admission  Medication Sig Dispense Refill Last Dose  . citalopram (CELEXA) 40 MG tablet Take 1 tablet (40 mg total) by mouth daily. 30 tablet 11 10/19/2015 at Unknown time  . cyclobenzaprine (FLEXERIL) 10 MG tablet Take 1 tablet (10 mg total) by mouth 3 (three) times daily as needed for muscle spasms. 30 tablet 0 10/19/2015 at Unknown time  . ibuprofen (ADVIL,MOTRIN) 200 MG tablet Take 200 mg by mouth every 6 (six) hours as needed for moderate pain.   10/19/2015 at Unknown time  . Melatonin 10 MG TABS Take 1 tablet by mouth at bedtime.   Past Week at Unknown time  . Multiple Vitamin  (MULTIVITAMIN) tablet Take 1 tablet by mouth daily.   10/19/2015 at Unknown time  . rizatriptan (MAXALT) 10 MG tablet TAKE 1 TABLET (10 MG TOTAL) BY MOUTH AS NEEDED FOR MIGRAINE. MAY REPEAT IN 2 HOURS IF NEEDED 12 tablet 0 Past Week at Unknown time  . traMADol (ULTRAM) 50 MG tablet Take 1-2 tablets (50-100 mg total) by mouth 3 (three) times daily as needed. 90 tablet 0 Past Week at Unknown time  . traZODone (DESYREL) 150 MG tablet Take 1 tablet (150 mg total) by mouth at bedtime. 30 tablet 11 10/19/2015 at Unknown time  . ALPRAZolam (XANAX) 0.5 MG tablet TAKE 1 TABLET BY MOUTH 3 TIMES A DAY (Patient not taking: Reported on 10/19/2015) 20 tablet 0 Unknown at Unknown time  . ARIPiprazole (ABILIFY) 5 MG tablet Take 1 tablet (5 mg total) by mouth daily. (Patient not taking: Reported on 10/19/2015) 30 tablet 3 Unknown at Unknown time  . hydrOXYzine (ATARAX/VISTARIL) 50 MG tablet Take 1-2 tablets (50-100 mg total) by mouth 3 (three) times daily as needed. (Patient not taking: Reported on 10/19/2015) 30 tablet 0 Unknown at Unknown time  . topiramate (TOPAMAX) 200 MG tablet Take 1 tablet (200 mg total) by mouth daily. (Patient not taking: Reported on 10/19/2015) 30 tablet 11 Unknown at Unknown time   Musculoskeletal: Strength & Muscle Tone: within normal limits Gait & Station: normal Patient leans: N/A  Psychiatric Specialty Exam: Physical Exam  Constitutional: She appears well-developed.  Obese  HENT:  Head: Normocephalic.  Eyes: Pupils are equal, round, and reactive to light.  Neck: Normal range of motion.  Cardiovascular: Normal rate.   Respiratory: Effort normal.  GI: Soft.  Genitourinary:  Denies any issues in this area  Musculoskeletal: Normal range of motion.  Neurological: She is alert.  Skin: Skin is warm and dry.  Psychiatric: Her speech is normal and behavior is normal. Thought content normal. Her mood appears anxious. Her affect is not angry, not blunt, not labile and not inappropriate.  Cognition and memory are normal. She expresses impulsivity. She exhibits a depressed mood.    Review of Systems  Constitutional: Negative.   HENT: Negative.   Eyes: Negative.   Respiratory: Negative.   Cardiovascular: Negative.   Gastrointestinal: Negative.   Genitourinary: Negative.   Musculoskeletal: Negative.   Skin: Negative.   Neurological: Negative.   Endo/Heme/Allergies: Negative.   Psychiatric/Behavioral: Positive for depression and substance abuse. Negative for suicidal ideas (Denies any thoughts, plans or intent), hallucinations and memory loss. The patient is nervous/anxious and has insomnia.     Blood pressure 109/54, pulse 80, temperature 98.5 F (36.9 C), temperature source Oral, resp. rate 18, height 5' 2"  (1.575 m), weight 114.306 kg (252 lb), last menstrual period 10/06/2015, SpO2 100 %.Body mass index is 46.08 kg/(m^2).  General Appearance: Casual, obese  Eye Contact:  Fair  Speech:  Clear and Coherent  Volume:  Normal  Mood:  Anxious  Affect:  Restricted  Thought Process:  Linear  Orientation:  Full (Time, Place, and Person)  Thought Content:  Denies any hallucinations, delusions or paranoia  Suicidal Thoughts:  Denies, but was brought to to the due to suspected overdose on 600 mg of Tramadol.  Homicidal Thoughts:  Denies  Memory:  Immediate;   Good Recent;   Good Remote;   Good  Judgement:  Fair  Insight:  Fair  Psychomotor Activity:  Normal  Concentration:  Concentration: Fair and Attention Span: Fair  Recall:  Good  Fund of Knowledge:  Fair  Language:  Good  Akathisia:  Negative  Handed:  Right  AIMS (if indicated):     Assets:  Communication Skills Desire for Improvement Social Support  ADL's:  Intact  Cognition:  WNL  Sleep:  Number of Hours: 1.25   Treatment Plan Summary: Daily contact with patient to assess and evaluate symptoms and progress in treatment and Medication management: 1. Admit for crisis management and stabilization, estimated  length of stay 3-5 days.  2. Medication management to reduce current symptoms to base line and improve the patient's overall level of functioning; Will resume Citalopram 20 mg for depression, Hydroxyzine 25 mg prn for anxiety & Trazodone 150 mg for insomnia. 3. Treat health problems as indicated.  4. Develop treatment plan to decrease risk of relapse upon discharge and the need for readmission.  5. Psycho-social education regarding relapse prevention and self care.  6. Health care follow up as needed for medical problems.  7. Review, reconcile, and reinstate any pertinent home medications for other health issues where appropriate. 8. Call for consults with hospitalist for any additional specialty patient care services as needed.  Observation Level/Precautions:  15 minute checks  Laboratory:  Per ED  Psychotherapy: Group sessions   Medications:  Will resume Citalopram 20 mg for depression, Hydroxyzine 25 mg prn for anxiety & Trazodone 150 mg for insomnia.  Consultations: As needed    Discharge Concerns: Safety, mood stability    Estimated LOS: 2-4 days  Other: Admit to [id number]   I certify that inpatient services furnished can reasonably be expected to improve the patient's condition.    Encarnacion Slates, NP, PMHNP, FNP-BC 6/22/201710:18 AM

## 2015-10-20 NOTE — BHH Suicide Risk Assessment (Signed)
[facility] Admission Suicide Risk Assessment   Nursing information obtained from:  Patient, Review of record Demographic factors:  Caucasian Current Mental Status:  NA Loss Factors:  Decline in physical health Historical Factors:  Family history of suicide (bio father completed suicide at age 39) Risk Reduction Factors:  Responsible for children under 71 years of age, Sense of responsibility to family, Employed, Living with another person, especially a relative, Positive social support, Positive therapeutic relationship  Total Time spent with patient: 30 minutes Principal Problem: MDD (major depressive disorder), recurrent episode, severe ([place]) Diagnosis:   Patient Active Problem List   Diagnosis Date Noted  . MDD (major depressive disorder), recurrent episode, severe ([place]) [F33.2] 10/20/2015  . Opioid use disorder, moderate, dependence ([place]) [F11.20] 10/20/2015  . Chronic pain syndrome [G89.4] 10/20/2015  . Low back pain [M54.5] 07/22/2015  . Excessive daytime sleepiness [G47.19] 10/28/2014  . Left cervical radiculopathy [M54.12] 04/02/2014  . Insomnia [G47.00] 12/01/2013  . Migraine without aura [G43.009] 08/04/2013  . Anxiety and depression [F41.8] 03/25/2013  . Hyperlipidemia [E78.5] 03/11/2012  . Preventive measure [Z29.9] 03/10/2012  . Obesity [E66.9] 03/10/2012  . 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 Pt today appears depressed, tearful and labile, ruminates about the death of her father who committed suicide in 2002 . Pt reports she continues to feel guilty about it , not being there for him when he needed her. Will need to obtain collateral information from family. Will continue treatment.   Continued Clinical Symptoms:  Alcohol Use Disorder Identification Test Final Score (AUDIT): 0 The "Alcohol Use Disorders Identification Test", Guidelines for Use in  Primary Care, Second Edition.  World Pharmacologist [employer] [facility]). Score between 0-7:  no or low risk or alcohol related problems. Score between 8-15:  moderate risk of alcohol related problems. Score between 16-19:  high risk of alcohol related problems. Score 20 or above:  warrants further diagnostic evaluation for alcohol dependence and treatment.   CLINICAL FACTORS:   Alcohol/Substance Abuse/Dependencies Previous Psychiatric Diagnoses and Treatments   Musculoskeletal: Strength & Muscle Tone: within normal limits Gait & Station: normal Patient leans: N/A  Psychiatric Specialty Exam: Physical Exam  Nursing note and vitals reviewed.   Review of Systems  Psychiatric/Behavioral: Positive for depression and substance abuse. The patient is nervous/anxious.   All other systems reviewed and are negative.   Blood pressure 109/54, pulse 80, temperature 98.5 F (36.9 C), temperature source Oral, resp. rate 18, height 5\' 2"  (1.575 m), weight 114.306 kg (252 lb), last menstrual period 10/06/2015, SpO2 100 %.Body mass index is 46.08 kg/(m^2).  General Appearance: Fairly Groomed  Eye Contact:  Minimal  Speech:  Normal Rate  Volume:  Decreased  Mood:  Anxious and Depressed  Affect:  Tearful  Thought Process:  Linear and Descriptions of Associations: Circumstantial  Orientation:  Full (Time, Place, and Person)  Thought Content:  Rumination  Suicidal Thoughts:  S/P OD - not sure if intentional  Homicidal Thoughts:  No  Memory:  Immediate;   Fair Recent;   Fair Remote;   Fair  Judgement:  Impaired  Insight:  Shallow  Psychomotor Activity:  Decreased  Concentration:  Concentration: Fair and Attention Span: Fair  Recall:  [employer] of Knowledge:  Fair  Language:  Fair  Akathisia:  No  Handed:  Right  AIMS (if indicated):     Assets:  Desire for Improvement  ADL's:  Intact  Cognition:  WNL  Sleep:  Number of Hours: 1.25      COGNITIVE FEATURES THAT CONTRIBUTE TO RISK:   Closed-mindedness, Polarized thinking and Thought constriction (tunnel vision)    SUICIDE RISK:   Moderate:  Frequent suicidal ideation with limited intensity, and duration, some specificity in terms of plans, no associated intent, good self-control, limited dysphoria/symptomatology, some risk factors present, and identifiable protective factors, including available and accessible social support.  PLAN OF CARE: Pt will need inpatient stay. Will continue treatment. Case discussed with Aggie NP - please see H&P.  I certify that inpatient services furnished can reasonably be expected to improve the patient's condition.   Summers Buendia, MD 10/20/2015, 2:31 PM

## 2015-10-20 NOTE — Progress Notes (Signed)
Patient did not attend the evening karaoke group. Pt was notified that group was beginning but remained in bed.    

## 2015-10-20 NOTE — Progress Notes (Signed)
D-  Patient has been isolative to room for the majority of the shift.  Patient was noted coming out in the dayroom for a brief period of time in which she was extremely tearful and stated "I just want to go home."  Patient has a depressed affect and is withdrawn with poor eye contact.   A- Assess patient for safety, offer medications as prescribed, engage patient in 1:1 staff talks  R-  Patient able to contract for safety, offer medications as prescribed.

## 2015-10-20 NOTE — Progress Notes (Signed)
Vol admit, 40 yo caucasian female, who presented to the [facility] after husband found her in the floor of their home having a seizure.  During her ED assessment, it was found that the pt had unintentionally overdosed on Tramadol.  She admitted that this had happened before with other medications.  Pt denies ever having suicidal thoughts.  Pt says that she has chronic back pain and takes a lot of medication for relief from her back pain.  Pt denies substance abuse of any kind.  Along with the chronic pain, pt states she has frequent migraine headaches.  Pt does not feel that she needs to be inpt at [facility] as she is not suicidal, and that she was led by the questioning at the ED to cause the ED staff to tell her that she would need to be evaluated at [facility].  She says she was told if she did not come voluntarily, she would be made involuntary.  Pt was irritable and upset at being at [facility], but she was cooperative with the admission process.  Search was completed and paperwork was signed.  Pt was oriented briefly to the unit d/t the late hour and given Flexeril for her back pain.  Safety checks q15 minutes were initiated.

## 2015-10-20 NOTE — BHH Group Notes (Signed)
[place] LCSW Group Therapy 10/20/2015  1:15 PM   Type of Therapy: Group Therapy  Participation Level: Did Not Attend. Patient invited to participate but declined.   Tilden Fossa, MSW, [place] Clinical Social Worker [facility] [phone]

## 2015-10-21 NOTE — Progress Notes (Signed)
[facility] MD Progress Note  10/21/2015 2:41 PM Regina [name]  MRN:  [id number] Subjective:  Im doing better. I haven't cried all day. I went to lunch and I took a bath today. I hadn't been doing that. Do you think I could go home today?   Objective: Patient observed reading a book, lying in bed. Patient  has been compliant with his medication and inpatient psychiatric program including milieu therapy and group therapy. Patient  has a disturbance of sleep and appetite, that has improved since admission. Patient has rates depression  2/10 and anxiety 4-5/10 and has suicidal ideation without intention or plan at this time. Patient has stressed out about her medications Patient stated that she is feeling safer in the hospital contract for safety while in the hospital.   Principal Problem: MDD (major depressive disorder), recurrent episode, severe ([place]) Diagnosis:   Patient Active Problem List   Diagnosis Date Noted  . MDD (major depressive disorder), recurrent episode, severe ([place]) [F33.2] 10/20/2015  . Opioid use disorder, moderate, dependence ([place]) [F11.20] 10/20/2015  . Chronic pain syndrome [G89.4] 10/20/2015  . Low back pain [M54.5] 07/22/2015  . Excessive daytime sleepiness [G47.19] 10/28/2014  . Left cervical radiculopathy [M54.12] 04/02/2014  . Insomnia [G47.00] 12/01/2013  . Migraine without aura [G43.009] 08/04/2013  . Anxiety and depression [F41.8] 03/25/2013  . Hyperlipidemia [E78.5] 03/11/2012  . Preventive measure [Z29.9] 03/10/2012  . Obesity [E66.9] 03/10/2012  . 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 Migraine   . Depression with anxiety   . Panic disorder   . Morbid obesity ([place])   . Depression   . Seizures [facility])     Past Surgical History  Procedure Laterality Date  . Soft tissue tumor resection  06/17/07    chest/ wound  reclosure 4/09'  . Dilation and curettage, diagnostic / therapeutic    . 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 Hypertension Mother   . Migraines Mother   . Depression Father   . Suicidality Father    Family Psychiatric  History: See HPI Social History:  History  Alcohol Use No     History  Drug Use No    Social History   Social History  . Marital Status: Married    Spouse Name: N/A  . Number of Children: N/A  . Years of Education: N/A   Social History Main Topics  . Smoking status: Never Smoker   . Smokeless tobacco: None  . Alcohol Use: No  . Drug Use: No  . Sexual Activity: Yes    Birth Control/ Protection: Condom   Other Topics Concern  . None   Social History Narrative   Additional Social History:    Pain Medications: See home med list Prescriptions: See home med list Over the Counter: See home med list History of alcohol / drug use?: No history of alcohol / drug abuse (Pt has overused her pain meds) Longest period of sobriety (when/how long): n/a     Sleep: Good  Appetite:  Fair  Current Medications: Current Facility-Administered Medications  Medication Dose Route Frequency Provider Last Rate Last Dose  . acetaminophen (TYLENOL) tablet 650 mg  650 mg Oral Q6H PRN Laverle Hobby, PA-C   650 mg at 10/20/15 1848  . alum &  mag hydroxide-simeth (MAALOX/MYLANTA) 200-200-20 MG/5ML suspension 30 mL  30 mL Oral Q4H PRN Laverle Hobby, PA-C      . aspirin-acetaminophen-caffeine (EXCEDRIN MIGRAINE) per tablet 1 tablet  1 tablet Oral Q6H PRN Encarnacion Slates, NP   1 tablet at 10/21/15 0900  . citalopram (CELEXA) tablet 40 mg  40 mg Oral Daily Laverle Hobby, PA-C   40 mg at 10/21/15 0854  . cyclobenzaprine (FLEXERIL) tablet 10 mg  10 mg Oral TID PRN Laverle Hobby, PA-C   10 mg at 10/21/15 0900  . hydrOXYzine (ATARAX/VISTARIL) tablet 25 mg  25 mg Oral Q6H PRN Laverle Hobby, PA-C   25 mg at  10/20/15 2231  . magnesium hydroxide (MILK OF MAGNESIA) suspension 30 mL  30 mL Oral Daily PRN Laverle Hobby, PA-C      . traZODone (DESYREL) tablet 150 mg  150 mg Oral QHS Laverle Hobby, PA-C   150 mg at 10/20/15 2231    Lab Results:  Results for orders placed or performed during the hospital encounter of 10/20/15 (from the past 48 hour(s))  TSH     Status: None   Collection Time: 10/20/15  6:08 PM  Result Value Ref Range   TSH 1.579 0.350 - 4.500 uIU/mL    Comment: Performed at [facility]    Blood Alcohol level:  Lab Results  Component Value Date   [facility] <[id number]    Metabolic Disorder Labs: Lab Results  Component Value Date   HGBA1C 5.0 10/26/2014   MPG 97 10/26/2014   MPG 100 03/10/2012   No results found for: PROLACTIN Lab Results  Component Value Date   CHOL 198 10/26/2014   TRIG 125 10/26/2014   HDL 41* 10/26/2014   CHOLHDL 4.8 10/26/2014   VLDL 25 10/26/2014   LDLCALC 132* 10/26/2014   LDLCALC 125* 03/25/2013    Physical Findings: AIMS: Facial and Oral Movements Muscles of Facial Expression: None, normal Lips and Perioral Area: None, normal Jaw: None, normal Tongue: None, normal,Extremity Movements Upper (arms, wrists, hands, fingers): None, normal Lower (legs, knees, ankles, toes): None, normal, Trunk Movements Neck, shoulders, hips: None, normal, Overall Severity Severity of abnormal movements (highest score from questions above): None, normal Incapacitation due to abnormal movements: None, normal Patient's awareness of abnormal movements (rate only patient's report): No Awareness, Dental Status Current problems with teeth and/or dentures?: No Does patient usually wear dentures?: No  CIWA:    COWS:     Musculoskeletal: Strength & Muscle Tone: within normal limits Gait & Station: normal Patient leans: N/A  Psychiatric Specialty Exam: Physical Exam  ROS  Blood pressure 102/58, pulse 94, temperature 97.7 F (36.5 C),  temperature source Oral, resp. rate 18, height 5\' 2"  (1.575 m), weight 114.306 kg (252 lb), last menstrual period 10/06/2015, SpO2 100 %.Body mass index is 46.08 kg/(m^2).  General Appearance: Fairly Groomed  Eye Contact:  Fair  Speech:  Clear and Coherent and Normal Rate  Volume:  Normal  Mood:  Depressed  Affect:  Depressed and Flat  Thought Process:  Coherent and Goal Directed  Orientation:  Full (Time, Place, and Person)  Thought Content:  WDL  Suicidal Thoughts:  No  Homicidal Thoughts:  No  Memory:  Immediate;   Fair Recent;   Fair  Judgement:  Impaired  Insight:  Lacking  Psychomotor Activity:  Normal  Concentration:  Concentration: Fair and Attention Span: Fair  Recall:  [employer] of Knowledge:  Fair  Language:  Fair  Akathisia:  No  Handed:  Right  AIMS (if indicated):     Assets:  Communication Skills Desire for Improvement Financial Resources/Insurance [place] Talents/Skills Vocational/Educational  ADL's:  Intact  Cognition:  WNL  Sleep:  Number of Hours: 6.25     Treatment Plan Summary: Daily contact with patient to assess and evaluate symptoms and progress in treatment and Medication management  1. Admit for crisis management and stabilization, estimated length of stay 3-5 days.  2. Medication management to reduce current symptoms to base line and improve the patient's overall level of functioning; Will resume Citalopram 20 mg for depression, Hydroxyzine 25 mg prn for anxiety & Trazodone 150 mg for insomnia. 3. Treat health problems as indicated.  4. Develop treatment plan to decrease risk of relapse upon discharge and the need for readmission.  5. Psycho-social education regarding relapse prevention and self care.  6. Health care follow up as needed for medical problems.  7. Review, reconcile, and reinstate any pertinent home medications for other health issues where appropriate. 8. Call for consults with  hospitalist for any additional specialty patient care services as needed.  Nanci Pina, FNP 10/21/2015, 2:41 PM

## 2015-10-21 NOTE — Tx Team (Signed)
Interdisciplinary Treatment Plan Update (Adult)  Date:  10/21/2015  Time Reviewed:  8:15 AM   Progress in Treatment: Attending groups: No. New to unit. Continuing to assess.  Participating in groups:  No. Taking medication as prescribed:  Yes. Tolerating medication:  Yes. Family/Significant othe contact made:  SPE requiired for pt.  Patient understands diagnosis:  Yes. and As evidenced by:  seeking treatment for depression, substance abuse, SI, and for medication stabilizatio. Discussing patient identified problems/goals with staff:  Yes. Medical problems stabilized or resolved:  Yes. Denies suicidal/homicidal ideation: Yes. Issues/concerns per patient self-inventory:  Other:  Discharge Plan or Barriers: CSW assessing for appropriate referrals.   Reason for Continuation of Hospitalization: Depression Medication stabilization Withdrawal symptoms  Comments: ARLY SALMINEN is an 40 y.o. female. Patient [facility] via EMS for medical clearance. Per ED notes, "Pt brought in EMS for new onset seizures. Husband reports he found wife on floor having grand mal seizure and turning blue. Husband placed pt in recovery position and pt began breathing and "coming back around"." Writer met with patient who admits to taking #6 100 mg tramdol at 9 am this morning (12 hour period of time). Patient is prescribed Tramadol however ran out several weeks ago. Sts [place] that she [place]. Patient has been taking the medications ordered from [place] approximately 1 week. Patient admits that she has ordered various medications from websites in the past. Patient denies this was a suicide attempt. Denies history of suicide attemps. Sts that she took the pills to relieve back pain. She admits that she has taken Tramadol for "many yrs". She takes #2-3 pills of Tramadol almost every day or when having "pain spells". Writer asked patient if this has happened in  the past and she stated, "Yes". Patient describes a similar episode in February 2017. Sts that she overdosed at that time and was found unconscious (no seizure). Patient overdosed at that time on Ambien and Ativan. She does admit to depression. She reports loss of interest in usual pleasures and guilt. Appetite is good with 30 pounds of weight gain in the past 1.5 year. She sleeps well (8hrs per night). Patient denies HI and AVH's. Patient has no history of INPT mental health or substance abuse treatment. She does not have a psychiatrist or therapist. Diagnosis: Major Depressive Disorder, Recurrent, Severe, without psychotic features; Anxiety Disorder, Substance Use Disorder (Tramadol)   Estimated length of stay:  3-5 days   New goal(s): to develop effective aftercare plan.   Additional Comments:  Patient and CSW reviewed pt's identified goals and treatment plan. Patient verbalized understanding and agreed to treatment plan. CSW reviewed [facility] "Discharge Process and Patient Involvement" Form. Pt verbalized understanding of information provided and signed form.    Review of initial/current patient goals per problem list:  1. Goal(s): Patient will participate in aftercare plan  Met: No.   Target date: at discharge  As evidenced by: Patient will participate within aftercare plan AEB aftercare provider and housing plan at discharge being identified.  6/23: CSW assessing for appropriate referrals.   2. Goal (s): Patient will exhibit decreased depressive symptoms and suicidal ideations.  Met: No.    Target date: at discharge  As evidenced by: Patient will utilize self rating of depression at 3 or below and demonstrate decreased signs of depression or be deemed stable for discharge by MD.  6/23: Pt rates depression as high; denies SI/HI/AVH.   3. Goal(s): Patient  will demonstrate decreased signs of withdrawal due to substance abuse  TJQ:ZESP progressing.   Target date:at discharge    As evidenced by: Patient will produce a CIWA/COWS score of 0, have stable vitals signs, and no symptoms of withdrawal.  6/23: Pt denies withdrawals. High BP. Goal progressing.   Attendees: Patient:   10/21/2015 8:15 AM   Family:   10/21/2015 8:15 AM   Physician:  Dr. Shea Evans; Dr. Einar Grad MD 10/21/2015 8:15 AM   Nursing:   Eustaquio Maize RN 10/21/2015 8:15 AM   Clinical Social Worker: Maxie Better, LCSW 10/21/2015 8:15 AM   Clinical Social Worker: Erasmo Downer Drinkard LCSW; Candis Shine LCSWA 10/21/2015 8:15 AM   Other:  Gerline Legacy Nurse Case Manager 10/21/2015 8:15 AM   Other:   10/21/2015 8:15 AM   Other:   10/21/2015 8:15 AM   Other:  10/21/2015 8:15 AM   Other:  10/21/2015 8:15 AM   Other:  10/21/2015 8:15 AM    10/21/2015 8:15 AM    10/21/2015 8:15 AM    10/21/2015 8:15 AM    10/21/2015 8:15 AM    Scribe for Treatment Team:   Maxie Better, LCSW 10/21/2015 8:15 AM

## 2015-10-21 NOTE — Progress Notes (Signed)
Pt has been in bed all evening with a migraine headache.  Pt did not attend evening karaoke group.  She denies SI/HI/AVH.  She was medicated for her headache and also received her hs meds.  Pt was encouraged to make her needs known to staff.  Pt has been polite and appropriate, although somewhat irritable that she was not discharged today.  She voiced no other needs or concerns.  Support and encouragement offered.  Discharge plans are in process.  Safety maintained with q15 minute checks.

## 2015-10-21 NOTE — BHH Counselor (Signed)
CSW attempted to meet with pt 2x today to complete Psycho social Assessment. Pt in bed sleeping. Unable to complete assessment at this time or discuss aftercare.   Maxie Better, MSW, LCSW Clinical Social Worker 10/21/2015 3:23 PM

## 2015-10-21 NOTE — Progress Notes (Signed)
D:  Patient's self inventory sheet, patient has fair sleep, sleep medication is helpful.  Poor appetite, low energy level, good concentration.  Rated depression 2, denied hopeless, anxiety 4-5.  Denied withdrawals.  Denied SI.  Physical problems, headaches, back pain, worst pain in past 24 hours is #6, head, back.  Pain medication is helpful.  Goal is to discharge.  Plans to talk to MD.  No discharge plans. A:  Medications administered per MD orders.  Emotional support and encouragement given patient. R:  Denied SI and HI, contracts for safety.  Denied A/V hallucinations.  Safety maintained with 15 minute checks. Patient's husband, Vernae Linen phone [phone], called and would like SW/MD call and talk to him about wife's discharge, outpatient therapy, etc.

## 2015-10-21 NOTE — Progress Notes (Signed)
D.  Pt has remained in bed this shift due to migraine.  Pt did not feel well enough to attend evening AA group.  Pt denies SI/HI/hallucinations at this time.  Very minimal interaction due to headache.  A.  Support and encouragement offered  R.  Pt remains safe on the unit, will continue to monitor.

## 2015-10-21 NOTE — BHH Group Notes (Signed)
[facility] LCSW Aftercare Discharge Planning Group Note   10/21/2015 10:34 AM  Participation Quality:  Invited. DID NOT ATTEND. Pt chose to rest in bed.  Smart, Cordarryl Monrreal LCSW

## 2015-10-21 NOTE — BHH Group Notes (Signed)
[facility] LCSW Group Therapy  10/21/2015 3:26 PM  Type of Therapy:  Group Therapy  Participation Level:  Did Not Attend-pt invited. Sleeping in room.   Summary of Progress/Problems: Feelings around Relapse. Group members discussed the meaning of relapse and shared personal stories of relapse, how it affected them and others, and how they perceived themselves during this time. Group members were encouraged to identify triggers, warning signs and coping skills used when facing the possibility of relapse. Social supports were discussed and explored in detail. Post Acute Withdrawal Syndrome (handout provided) was introduced and examined. Pt's were encouraged to ask questions, talk about key points associated with PAWS, and process this information in terms of relapse prevention.   Smart, Regina Perleberg LCSW 10/21/2015, 3:26 PM

## 2015-10-22 NOTE — BHH Group Notes (Signed)
Date:  10/22/2015 Time:  10:00AM-11:00AM  Group Topic/Focus:  The main focus of today's therapy group was to identify unhealthy coping techniques which led to hospitalization and to start looking at healthy coping skills to be learned to use instead in similar circumstances.  Motivational Interviewing was used to highlight patient ambivilence.  Scaling questions were asked to help define for patients where they are right now on the issue they had initially raised.  Participation Level:  Active  Participation Quality:  Appropriate  Affect:  Depressed and Tearful  Cognitive:  Appropriate  Insight: Improving  Engagement in Group:  Engaged  Modes of Intervention:  Discussion and Motivational Interviewing  Additional Comments:  Pt stated she has been taking pain medications for her back, started taking too much and ended up having a seizure, being found by her children.  She stated she needs to get someone to help manage her medicine so that this does not happen again, because she realizes she could have died.  She also stated she needs to do what the physical therapist tells her in order to reduce her pain.  Her first step is to ask for help.  She stated her desire to change is 10 on a scale of 1-10.  [name] 10/22/2015, 12:28 PM

## 2015-10-22 NOTE — BHH Counselor (Signed)
Adult Comprehensive Assessment  Patient ID: SKYLYNNE POORBAUGH, female   DOB: 1975-11-09, 40 y.o.   MRN: [id number]  Information Source: Information source: Patient  Current Stressors:  Educational / Learning stressors: Going back to school starting in the fall to study accounting, a little nervous Employment / Job issues: Denies stressors Family Relationships: Denies Engineer, mining / Lack of resources (include bankruptcy): Denies stressors Housing / Lack of housing: Denies stressors Physical health (include injuries & life threatening diseases): Chronic back pain for a few months, migraines Social relationships: Denies stressors Substance abuse: Wants to feel better so is not being responsible with her medications, which is why she is hospitalized Bereavement / Loss: Denies stressors  Living/Environment/Situation:  Living Arrangements: Spouse/significant other, Children, Parent, Other relatives (husband, 3 children, mother, stepfather) Living conditions (as described by patient or guardian): [place] How long has patient lived in current situation?: Mother and stepfather have lived with them 3 years, lived with husband and children for 17 years What is atmosphere in current home: Supportive  Family History:  Marital status: Married Number of Years Married: 88 What types of issues is patient dealing with in the relationship?: Has been feeling guilty about not pulling her weight, husband has been reassuring her Are you sexually active?: Yes What is your sexual orientation?: Heterosexual Does patient have children?: Yes How many children?: 3 How is patient's relationship with their children?: 20yo, 58yo, 9yo - Mostly a good relationship, they test occasionally  Childhood History:  By whom was/is the patient raised?: Mother, Father Additional childhood history information: Mother and father divorced when pt was a teenager Description of patient's relationship with  caregiver when they were a child: Mother - worked a lot, gone, butted heads; Father - pretty good until they divorced, then nonexistent for awhile, then okay for awhile Patient's description of current relationship with people who raised him/her: Father - committed suicide when she was 30yo; Mother - good now; Stepfather - good How were you disciplined when you got in trouble as a child/adolescent?: Spankings, restrictions Does patient have siblings?: Yes Number of Siblings: 1 Description of patient's current relationship with siblings: Sister is married to husband's brother - very close Did patient suffer any verbal/emotional/physical/sexual abuse as a child?: No Did patient suffer from severe childhood neglect?: No Has patient ever been sexually abused/assaulted/raped as an adolescent or adult?: No Was the patient ever a victim of a crime or a disaster?: No Witnessed domestic violence?: No Has patient been effected by domestic violence as an adult?: No  Education:  Highest grade of school patient has completed: Master's degree in Art therapist information studies Currently a student?: No Name of school: In the fall will be going to [employer] Learning disability?: No  Employment/Work Situation:   Employment situation: Unemployed What is the longest time patient has a held a job?: 13 years 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 Has patient ever been in the [employer]?: No Are There Guns or Other Weapons in [place]?: No  Financial Resources:   Financial resources: Income from spouse, Private insurance Does patient have a representative payee or guardian?: No  Alcohol/Substance Abuse:   What has been your use of drugs/alcohol within the last 12 months?: Using pain medications for back, stopped anti-depressant and was on Abilify for awhile Alcohol/Substance Abuse Treatment Hx: Denies past history Has alcohol/substance abuse ever caused legal problems?: No  Social Support  System:   Pensions consultant Support System: Good Describe Community Support System:  Husband, mother, sister Type of faith/religion: Catholic, not practicing How does patient's faith help to cope with current illness?: Thinking about going back to church to see if it helps  Leisure/Recreation:   Leisure and Hobbies: Reading, doing things with daughters  Strengths/Needs:   What things does the patient do well?: Academics, clean, organized In what areas does patient struggle / problems for patient: Back pain, adhering to medication guidelines  Discharge Plan:   Does patient have access to transportation?: Yes Will patient be returning to same living situation after discharge?: Yes Currently receiving community mental health services: Yes (From Whom) (Family doctor does medications - Dr. Dolores Hoose, [facility]) If no, would patient like referral for services when discharged?: Yes (What county?) (Wants a therapist) Does patient have financial barriers related to discharge medications?: No  Summary/Recommendations:   Summary and Recommendations (to be completed by the evaluator): Patient is a 40yo female admitted to the hospital with an overdose on Tramadol that led to a seizure and reports primary trigger for admission was IVC resulting from her overdose/misuse of pain meds.  Patient will benefit from crisis stabilization, medication evaluation, group therapy and psychoeducation, in addition to case management for discharge planning. At discharge it is recommended that Patient adhere to the established discharge plan and continue in treatment.  [name]. 10/22/2015

## 2015-10-22 NOTE — Progress Notes (Signed)
D Brenna does not say much today, other than ask for a prn flexeril this AM.  A She did complete her daily assessment and on it she wrote she deneid SI today and she rated her depression, hopelessness and anxiety " 0/0/0/", respectively. R Safety in place.

## 2015-10-22 NOTE — Progress Notes (Signed)
[facility] MD Progress Note  10/22/2015 1:41 PM Regina [name]  MRN:  [id number] Subjective: Patient reports " I am feeling better, I feel ready to leave."  Objective: Regina [name] is awake, alert and oriented X4. Seen talking on the telephone. Denies suicidal or homicidal ideation. Denies auditory or visual hallucination and does not appear to be responding to internal stimuli.  Patient reports she is medication compliant without mediation side effects. States her depression 0/10. Patient reports " I am no longer depressed and I am ready to leave. Reports that someone was supposed to call her husband for collateral and he had a missed call.  Reports good appetite and states she is resting well. Patient report she is excited regarding discharge in hopes she will be leaving soon. Support, encouragement and reassurance was provided.   Principal Problem: MDD (major depressive disorder), recurrent episode, severe ([place]) Diagnosis:   Patient Active Problem List   Diagnosis Date Noted  . MDD (major depressive disorder), recurrent episode, severe ([place]) [F33.2] 10/20/2015  . Opioid use disorder, moderate, dependence ([place]) [F11.20] 10/20/2015  . Chronic pain syndrome [G89.4] 10/20/2015  . Low back pain [M54.5] 07/22/2015  . Excessive daytime sleepiness [G47.19] 10/28/2014  . Left cervical radiculopathy [M54.12] 04/02/2014  . Insomnia [G47.00] 12/01/2013  . Migraine without aura [G43.009] 08/04/2013  . Anxiety and depression [F41.8] 03/25/2013  . Hyperlipidemia [E78.5] 03/11/2012  . Preventive measure [Z29.9] 03/10/2012  . Obesity [E66.9] 03/10/2012  . 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 Migraine   . Depression with anxiety   . Panic disorder   . Morbid obesity ([place])   . Depression   . Seizures [facility])     Past Surgical History  Procedure Laterality Date   . Soft tissue tumor resection  06/17/07    chest/ wound reclosure 4/09'  . Dilation and curettage, diagnostic / therapeutic    . 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 Hypertension Mother   . Migraines Mother   . Depression Father   . Suicidality Father    Family Psychiatric  History: See HPI Social History:  History  Alcohol Use No     History  Drug Use No    Social History   Social History  . Marital Status: Married    Spouse Name: N/A  . Number of Children: N/A  . Years of Education: N/A   Social History Main Topics  . Smoking status: Never Smoker   . Smokeless tobacco: None  . Alcohol Use: No  . Drug Use: No  . Sexual Activity: Yes    Birth Control/ Protection: Condom   Other Topics Concern  . None   Social History Narrative   Additional Social History:    Pain Medications: See home med list Prescriptions: See home med list Over the Counter: See home med list History of alcohol / drug use?: No history of alcohol / drug abuse (Pt has overused her pain meds) Longest period of sobriety (when/how long): n/a     Sleep: Good  Appetite:  Fair  Current Medications: Current Facility-Administered Medications  Medication Dose Route Frequency Provider Last Rate Last Dose  . acetaminophen (TYLENOL) tablet 650 mg  650 mg Oral Q6H PRN Laverle Hobby, PA-C   650 mg at  10/21/15 1748  . alum & mag hydroxide-simeth (MAALOX/MYLANTA) 200-200-20 MG/5ML suspension 30 mL  30 mL Oral Q4H PRN Laverle Hobby, PA-C      . aspirin-acetaminophen-caffeine [facility] MIGRAINE) per tablet 1 tablet  1 tablet Oral Q6H PRN Encarnacion Slates, NP   1 tablet at 10/22/15 0917  . citalopram (CELEXA) tablet 40 mg  40 mg Oral Daily Laverle Hobby, PA-C   40 mg at 10/22/15 [id number]  . cyclobenzaprine (FLEXERIL) tablet 10 mg  10 mg Oral TID PRN Laverle Hobby, PA-C   10 mg at 10/22/15 [id number]  . hydrOXYzine (ATARAX/VISTARIL) tablet 25  mg  25 mg Oral Q6H PRN Laverle Hobby, PA-C   25 mg at 10/20/15 2231  . magnesium hydroxide (MILK OF MAGNESIA) suspension 30 mL  30 mL Oral Daily PRN Laverle Hobby, PA-C      . traZODone (DESYREL) tablet 150 mg  150 mg Oral QHS Laverle Hobby, PA-C   150 mg at 10/22/15 [id number]    Lab Results:  Results for orders placed or performed during the hospital encounter of 10/20/15 (from the past 48 hour(s))  TSH     Status: None   Collection Time: 10/20/15  6:08 PM  Result Value Ref Range   TSH 1.579 0.350 - 4.500 uIU/mL    Comment: Performed at [facility]    Blood Alcohol level:  Lab Results  Component Value Date   [facility] <5 [id number]    Metabolic Disorder Labs: Lab Results  Component Value Date   HGBA1C 5.0 10/26/2014   MPG 97 10/26/2014   MPG 100 03/10/2012   No results found for: PROLACTIN Lab Results  Component Value Date   CHOL 198 10/26/2014   TRIG 125 10/26/2014   HDL 41* 10/26/2014   CHOLHDL 4.8 10/26/2014   VLDL 25 10/26/2014   LDLCALC 132* 10/26/2014   LDLCALC 125* 03/25/2013    Physical Findings: AIMS: Facial and Oral Movements Muscles of Facial Expression: None, normal Lips and Perioral Area: None, normal Jaw: None, normal Tongue: None, normal,Extremity Movements Upper (arms, wrists, hands, fingers): None, normal Lower (legs, knees, ankles, toes): None, normal, Trunk Movements Neck, shoulders, hips: None, normal, Overall Severity Severity of abnormal movements (highest score from questions above): None, normal Incapacitation due to abnormal movements: None, normal Patient's awareness of abnormal movements (rate only patient's report): No Awareness, Dental Status Current problems with teeth and/or dentures?: No Does patient usually wear dentures?: No  CIWA:  CIWA-Ar Total: 1 COWS:  COWS Total Score: 2  Musculoskeletal: Strength & Muscle Tone: within normal limits Gait & Station: normal Patient leans: N/A  Psychiatric Specialty  Exam: Physical Exam  Nursing note and vitals reviewed. Constitutional: She is oriented to person, place, and time. She appears well-developed.  Neurological: She is alert and oriented to person, place, and time.  Psychiatric: She has a normal mood and affect. Her behavior is normal.    Review of Systems  Psychiatric/Behavioral: Positive for depression. Negative for suicidal ideas and hallucinations. The patient has insomnia.   All other systems reviewed and are negative.   Blood pressure 136/73, pulse 87, temperature 97.6 F (36.4 C), temperature source Oral, resp. rate 18, height 5\' 2"  (1.575 m), weight 114.306 kg (252 lb), last menstrual period 10/06/2015, SpO2 100 %.Body mass index is 46.08 kg/(m^2).  General Appearance: Fairly Groomed  Eye Contact:  Fair  Speech:  Clear and Coherent and Normal Rate  Volume:  Normal  Mood:  Depressed  Affect:  Depressed and Flat  Thought Process:  Coherent and Goal Directed  Orientation:  Full (Time, Place, and Person)  Thought Content:  WDL  Suicidal Thoughts:  No  Homicidal Thoughts:  No  Memory:  Immediate;   Fair Recent;   Fair  Judgement:  Impaired  Insight:  Lacking  Psychomotor Activity:  Normal  Concentration:  Concentration: Fair and Attention Span: Fair  Recall:  [employer] of Knowledge:  Fair  Language:  Fair  Akathisia:  No  Handed:  Right  AIMS (if indicated):     Assets:  Desire for Improvement Intimacy Physical Health Social Support  ADL's:  Intact  Cognition:  WNL  Sleep:  Number of Hours: 6.75     I agree with current treatment plan on 10/22/2015, Patient seen face-to-face for psychiatric evaluation follow-up, chart reviewed. Reviewed the information documented and agree with the treatment plan.  Treatment Plan Summary: Daily contact with patient to assess and evaluate symptoms and progress in treatment and Medication management  1. Admit for crisis management and stabilization, estimated length of stay 3-5 days.   2. Medication management to reduce current symptoms to base line and improve the patient's overall level of functioning; Will resume Citalopram 20 mg for depression, Hydroxyzine 25 mg prn for anxiety & Trazodone 150 mg for insomnia. 3. Treat health problems as indicated.  4. Develop treatment plan to decrease risk of relapse upon discharge and the need for readmission.  5. Psycho-social education regarding relapse prevention and self care.  6. Health care follow up as needed for medical problems.  7. Review, reconcile, and reinstate any pertinent home medications for other health issues where appropriate. 8. Call for consults with hospitalist for any additional specialty patient care services as needed.  Derrill Center, NP 10/22/2015, 1:41 PM

## 2015-10-23 DIAGNOSIS — F332 Major depressive disorder, recurrent severe without psychotic features: Secondary | ICD-10-CM | POA: Insufficient documentation

## 2015-10-23 NOTE — Progress Notes (Signed)
Psychoeducational Group Note  Date:  10/23/2015 Time: 0900 Group Topic/Focus:  Daily Goals Group:   The group focuses on teaching patients how to set attainable  daily goals that will aid them in maintaining their recovery. Also discussed issues they want to address with the practitioner today.  Participation Level:  Did Not Attend   Additional Comments:    Lauralyn Primes 10:31 AM. 10/23/2015

## 2015-10-23 NOTE — Progress Notes (Signed)
[facility] MD Progress Note  10/23/2015 11:14 AM Regina [name]  MRN:  [id number] Subjective: Patient reports " I am having a little bit of back pain but I still feel ready to leave."  -Patient is requesting that someone calls and speaks to her husband before she is discharged.  Objective: Regina [name] is awake, alert and oriented X4. Seen attending group session. Denies suicidal or homicidal ideation. Denies auditory or visual hallucination and does not appear to be responding to internal stimuli.  Patient reports she is medication compliant without mediation side effects. States her depression 0/10. Patient appears to be minimizing depression/ depressive symptoms. Patient reports  I am no longer depressed and I wasn't trying to kill myself. I was just having bad back pain." Reports good appetite and states she is resting well. Patient report she is excited regarding discharge in hopes she will be leaving soon. Support, encouragement and reassurance was provided.   Principal Problem: MDD (major depressive disorder), recurrent episode, severe ([place]) Diagnosis:   Patient Active Problem List   Diagnosis Date Noted  . MDD (major depressive disorder), recurrent episode, severe ([place]) [F33.2] 10/20/2015  . Opioid use disorder, moderate, dependence ([place]) [F11.20] 10/20/2015  . Chronic pain syndrome [G89.4] 10/20/2015  . Low back pain [M54.5] 07/22/2015  . Excessive daytime sleepiness [G47.19] 10/28/2014  . Left cervical radiculopathy [M54.12] 04/02/2014  . Insomnia [G47.00] 12/01/2013  . Migraine without aura [G43.009] 08/04/2013  . Anxiety and depression [F41.8] 03/25/2013  . Hyperlipidemia [E78.5] 03/11/2012  . Preventive measure [Z29.9] 03/10/2012  . Obesity [E66.9] 03/10/2012  . 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 Migraine   . Depression with anxiety    . Panic disorder   . Morbid obesity ([place])   . Depression   . Seizures [facility])     Past Surgical History  Procedure Laterality Date  . Soft tissue tumor resection  06/17/07    chest/ wound reclosure 4/09'  . Dilation and curettage, diagnostic / therapeutic    . 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 Hypertension Mother   . Migraines Mother   . Depression Father   . Suicidality Father    Family Psychiatric  History: See HPI Social History:  History  Alcohol Use No     History  Drug Use No    Social History   Social History  . Marital Status: Married    Spouse Name: N/A  . Number of Children: N/A  . Years of Education: N/A   Social History Main Topics  . Smoking status: Never Smoker   . Smokeless tobacco: None  . Alcohol Use: No  . Drug Use: No  . Sexual Activity: Yes    Birth Control/ Protection: Condom   Other Topics Concern  . None   Social History Narrative   Additional Social History:    Pain Medications: See home med list Prescriptions: See home med list Over the Counter: See home med list History of alcohol / drug use?: No history of alcohol / drug abuse (Pt has overused her pain meds) Longest period of sobriety (when/how long): n/a     Sleep: Good  Appetite:  Fair  Current Medications: Current Facility-Administered Medications  Medication Dose Route Frequency Provider Last Rate Last Dose  .  acetaminophen (TYLENOL) tablet 650 mg  650 mg Oral Q6H PRN Laverle Hobby, PA-C   650 mg at 10/23/15 0755  . alum & mag hydroxide-simeth (MAALOX/MYLANTA) 200-200-20 MG/5ML suspension 30 mL  30 mL Oral Q4H PRN Laverle Hobby, PA-C      . aspirin-acetaminophen-caffeine [facility] MIGRAINE) per tablet 1 tablet  1 tablet Oral Q6H PRN Encarnacion Slates, NP   1 tablet at 10/22/15 2110  . citalopram (CELEXA) tablet 40 mg  40 mg Oral Daily Laverle Hobby, PA-C   40 mg at 10/23/15 0754  .  cyclobenzaprine (FLEXERIL) tablet 10 mg  10 mg Oral TID PRN Laverle Hobby, PA-C   10 mg at 10/23/15 0755  . hydrOXYzine (ATARAX/VISTARIL) tablet 25 mg  25 mg Oral Q6H PRN Laverle Hobby, PA-C   25 mg at 10/20/15 2231  . magnesium hydroxide (MILK OF MAGNESIA) suspension 30 mL  30 mL Oral Daily PRN Laverle Hobby, PA-C      . traZODone (DESYREL) tablet 150 mg  150 mg Oral QHS Laverle Hobby, PA-C   150 mg at 10/22/15 2110    Lab Results:  No results found for this or any previous visit (from the past 48 hour(s)).  Blood Alcohol level:  Lab Results  Component Value Date   ETH <5 [id number]    Metabolic Disorder Labs: Lab Results  Component Value Date   HGBA1C 5.0 10/26/2014   MPG 97 10/26/2014   MPG 100 03/10/2012   No results found for: PROLACTIN Lab Results  Component Value Date   CHOL 198 10/26/2014   TRIG 125 10/26/2014   HDL 41* 10/26/2014   CHOLHDL 4.8 10/26/2014   VLDL 25 10/26/2014   LDLCALC 132* 10/26/2014   LDLCALC 125* 03/25/2013    Physical Findings: AIMS: Facial and Oral Movements Muscles of Facial Expression: None, normal Lips and Perioral Area: None, normal Jaw: None, normal Tongue: None, normal,Extremity Movements Upper (arms, wrists, hands, fingers): None, normal Lower (legs, knees, ankles, toes): None, normal, Trunk Movements Neck, shoulders, hips: None, normal, Overall Severity Severity of abnormal movements (highest score from questions above): None, normal Incapacitation due to abnormal movements: None, normal Patient's awareness of abnormal movements (rate only patient's report): No Awareness, Dental Status Current problems with teeth and/or dentures?: No Does patient usually wear dentures?: No  CIWA:  CIWA-Ar Total: 1 COWS:  COWS Total Score: 2  Musculoskeletal: Strength & Muscle Tone: within normal limits Gait & Station: normal Patient leans: N/A  Psychiatric Specialty Exam: Physical Exam  Nursing note and vitals  reviewed. Constitutional: She is oriented to person, place, and time. She appears well-developed.  Neurological: She is alert and oriented to person, place, and time.  Psychiatric: She has a normal mood and affect. Her behavior is normal.    Review of Systems  Musculoskeletal: Positive for back pain.  Psychiatric/Behavioral: Positive for depression. Negative for suicidal ideas and hallucinations. The patient has insomnia.   All other systems reviewed and are negative.   Blood pressure 97/58, pulse 97, temperature 98.2 F (36.8 C), temperature source Oral, resp. rate 18, height 5\' 2"  (1.575 m), weight 114.306 kg (252 lb), last menstrual period 10/06/2015, SpO2 100 %.Body mass index is 46.08 kg/(m^2).  General Appearance: Fairly Groomed  Eye Contact:  Fair  Speech:  Clear and Coherent and Normal Rate  Volume:  Normal  Mood:  Depressed and Euthymic  Affect:  Depressed and Flat  Thought Process:  Coherent and Goal Directed  Orientation:  Full (Time, Place, and Person)  Thought Content:  WDL  Suicidal Thoughts:  No  Homicidal Thoughts:  No  Memory:  Immediate;   Fair Recent;   Fair  Judgement:  Impaired  Insight:  Lacking  Psychomotor Activity:  Normal  Concentration:  Concentration: Fair and Attention Span: Fair  Recall:  [employer] of Knowledge:  Fair  Language:  Fair  Akathisia:  No  Handed:  Right  AIMS (if indicated):     Assets:  Desire for Improvement Intimacy Physical Health Social Support  ADL's:  Intact  Cognition:  WNL  Sleep:  Number of Hours: 6.5     I agree with current treatment plan on 10/22/2015, Patient seen face-to-face for psychiatric evaluation follow-up, chart reviewed. Reviewed the information documented and agree with the treatment plan.  Treatment Plan Summary: Daily contact with patient to assess and evaluate symptoms and progress in treatment and Medication management  1. Admit for crisis management and stabilization, estimated length of stay 3-5  days.  2. Medication management to reduce current symptoms to base line and improve the patient's overall level of functioning Continue Citalopram 20 mg for depression, Continue Hydroxyzine 25 mg PRN for anxiety  Continue Trazodone 150 mg for insomnia. 3. Treat health problems as indicated.  4. Develop treatment plan to decrease risk of relapse upon discharge and the need for readmission.  5. Psycho-social education regarding relapse prevention and self care.  6. Health care follow up as needed for medical problems.  7. Review, reconcile, and reinstate any pertinent home medications for other health issues where appropriate. 8. Call for consults with hospital ist for any additional specialty patient care services as needed.  Derrill Center, NP 10/23/2015, 11:14 AM   Reviewed the information documented and agree with the treatment plan.  Regina [name] 10/23/2015 2:13 PM

## 2015-10-23 NOTE — Progress Notes (Addendum)
D Delight makes better eye contact this morning. She  Is still  avoidant. Answers very quickly and then gets quiet and looks at the floor. A She does complete her daily assessment and on it she wrote  She deneid SI today and she rated her depresion, hopelssness and anxiety " 0/0/0/", respectively.R Safety in place and poc cont.

## 2015-10-23 NOTE — [facility] Group Notes (Signed)
Adult Psychoeducational Group Note  Date:  10/23/2015 Time:  12:03 PM  Group Topic/Focus:  Today's group focused on introducing the concept of the impact of thoughts, feelings and behaviors on each other, and how it is thought that behavioral changes are the easiest to target, often leading to changes in feelings and thoughts.  We evaluated excellent supports who would receive a score of A+ versus mediocre supports (C) and poor supports (F-).  We then talked extensively about how to use healthy supports to increase healthy behaviors and reduce the unhealthy supports in order to be better protected against engaging in healthy behaviors.  Participation Level:  Active  Participation Quality:  Attentive, Sharing and Supportive  Affect:  Blunted  Cognitive:  Appropriate  Insight: Good  Engagement in Group:  Engaged  Modes of Intervention:  Clarification and Exploration  Additional Comments:  Regina [name] was able to acknowledge that while she has decided to engage in the behavior of allowing someone else to manage her medications because her past behavior has shown her that she cannot be trusted with that, nonetheless, she anticipates that she will feel somewhat less in control of her life.  The group talked about how in other ways she will feel more in control of her life through medication compliance, better pain management, and knowing she is safe and her family is comforted.  [name] 10/23/2015, 12:03 PM

## 2015-10-23 NOTE — Progress Notes (Signed)
D. Pt pleasant on approach, continues to suffer from headaches.  Pt would like to have something ordered for her anxiety.  Pt observed interacting appropriately with peers on the unit.  Pt requested medication soon after 2100 and went to bed.  A.  Support and encouragement offered, medication given as ordered  R.  Pt remains safe on the unit, will continue to monitor.

## 2015-10-24 MED ORDER — TRAZODONE HCL 150 MG PO TABS: 150.0000 mg | ORAL_TABLET | Freq: Every day | ORAL | Status: DC

## 2015-10-24 MED ORDER — CITALOPRAM HYDROBROMIDE 40 MG PO TABS: 40.0000 mg | ORAL_TABLET | Freq: Every day | ORAL | Status: DC

## 2015-10-24 MED ORDER — HYDROXYZINE HCL 25 MG PO TABS: 25.0000 mg | ORAL_TABLET | Freq: Four times a day (QID) | ORAL | Status: DC | PRN

## 2015-10-24 NOTE — Tx Team (Signed)
Interdisciplinary Treatment Plan Update (Adult)  Date:  10/24/2015  Time Reviewed:  9:49 AM   Progress in Treatment: Attending groups: Yes Participating in groups: Yes Taking medication as prescribed:  Yes. Tolerating medication:  Yes. Family/Significant othe contact made:  SPE completed with pt and contact attempts made with pt's husband/request for call back with CSW number provided. Patient understands diagnosis:  Yes. and As evidenced by:  seeking treatment for depression, substance abuse, SI, and for medication stabilizatio. Discussing patient identified problems/goals with staff:  Yes. Medical problems stabilized or resolved:  Yes. Denies suicidal/homicidal ideation: Yes. Issues/concerns per patient self-inventory:  Other:  Discharge Plan or Barriers: Pt plans to return home; continue seeing PCP Dr. Darene Lamer at [facility]. Pt also requested therapy referral-appt made at [facility] for counseling. Mental Health Association information provided to pt as well.   Reason for Continuation of Hospitalization: none  Comments: Regina [name] is an 40 y.o. female. Patient presents to [facility] via EMS for medical clearance. Per ED notes, "Pt brought in EMS for new onset seizures. Husband reports he found wife on floor having grand mal seizure and turning blue. Husband placed pt in recovery position and pt began breathing and "coming back around"." Writer met with patient who admits to taking #6 100 mg tramdol at 9 am this morning (12 hour period of time). Patient is prescribed Tramadol however ran out several weeks ago. Sts [place] that she [place]. Patient has been taking the medications ordered from [place] approximately 1 week. Patient admits that she has ordered various medications from websites in the past. Patient denies this was a suicide attempt. Denies history of suicide attemps. Sts that she took the pills to  relieve back pain. She admits that she has taken Tramadol for "many yrs". She takes #2-3 pills of Tramadol almost every day or when having "pain spells". Writer asked patient if this has happened in the past and she stated, "Yes". Patient describes a similar episode in February 2017. Sts that she overdosed at that time and was found unconscious (no seizure). Patient overdosed at that time on Ambien and Ativan. She does admit to depression. She reports loss of interest in usual pleasures and guilt. Appetite is good with 30 pounds of weight gain in the past 1.5 year. She sleeps well (8hrs per night). Patient denies HI and AVH's. Patient has no history of INPT mental health or substance abuse treatment. She does not have a psychiatrist or therapist. Diagnosis: Major Depressive Disorder, Recurrent, Severe, without psychotic features; Anxiety Disorder, Substance Use Disorder (Tramadol)   Estimated length of stay:  D/c today   Additional Comments:  Patient and CSW reviewed pt's identified goals and treatment plan. Patient verbalized understanding and agreed to treatment plan. CSW reviewed [facility] "Discharge Process and Patient Involvement" Form. Pt verbalized understanding of information provided and signed form.    Review of initial/current patient goals per problem list:  1. Goal(s): Patient will participate in aftercare plan  Met: Yes   Target date: at discharge  As evidenced by: Patient will participate within aftercare plan AEB aftercare provider and housing plan at discharge being identified.  6/23: CSW assessing for appropriate referrals.   6/26: Pt plans to return home; follow-up at [facility] outpatient clinic for medication management and counseling-appts have been made.   2. Goal (s): Patient will exhibit decreased depressive symptoms and suicidal ideations.  Met: Yes.    Target  date: at discharge  As evidenced by: Patient will utilize self rating of depression at 3  or below and demonstrate decreased signs of depression or be deemed stable for discharge by MD.  6/23: Pt rates depression as high; denies SI/HI/AVH.   6/26: Pt rates depression as 1/10 and presents with pleasant mood/excited affect. "I'm ready to go home today." Denies SI/HI/AVH.   3. Goal(s): Patient will demonstrate decreased signs of withdrawal due to substance abuse  Met:Yes  Target date:at discharge   As evidenced by: Patient will produce a CIWA/COWS score of 0, have stable vitals signs, and no symptoms of withdrawal.  6/23: Pt denies withdrawals. High BP. Goal progressing.   6/26: Pt denies withdrawals. Stable vitals. Goal met  Attendees: Patient:   10/24/2015 9:49 AM   Family:   10/24/2015 9:49 AM   Physician:  Dr. Shea Evans 10/24/2015 9:49 AM   Nursing:   Sherrine Maples RN  10/24/2015 9:49 AM   Clinical Social Worker: Maxie Better, LCSW 10/24/2015 9:49 AM   Clinical Social Worker: Erasmo Downer Drinkard LCSW; Candis Shine LCSWA 10/24/2015 9:49 AM   Other:  Ricky Ala NP  10/24/2015 9:49 AM   Other:   10/24/2015 9:49 AM   Other:   10/24/2015 9:49 AM   Other:  10/24/2015 9:49 AM   Other:  10/24/2015 9:49 AM   Other:  10/24/2015 9:49 AM    10/24/2015 9:49 AM    10/24/2015 9:49 AM    10/24/2015 9:49 AM    10/24/2015 9:49 AM    Scribe for Treatment Team:   Maxie Better, LCSW 10/24/2015 9:49 AM

## 2015-10-24 NOTE — Progress Notes (Signed)
  [facility] Adult Case Management Discharge Plan :  Will you be returning to the same living situation after discharge:  Yes,  home with family At discharge, do you have transportation home?: Yes,  husband Do you have the ability to pay for your medications: Yes,  [employer] insurance  Release of information consent forms completed and submitted to medical records by CSW.   Patient to Follow up at: Follow-up Information    Follow up with [facility]-Medication Management. Go on 10/28/2015.   Why:  Appt on this date at 3:30PM for medication management/hospital follow-up. Thank you.    Contact information:   ATTN: "Dr. Darene Lamer."  [address], [place], [place] [address] Phone: [phone]  Fax: [phone]      Follow up with [facility] (Counseling) On 11/14/2015.   Why:  Appt on this date at 1:00PM with Cordella Register for therapy. Thank you.    Contact information:   [address] [place], [place] [address] Phone: [phone] Fax: [phone]      Next level of care provider has access to [place] and Suicide Prevention discussed: Yes,  SPE completed with pt; contact attempts made with pt's husband/call back requested. SPI pamphlet and Mobile Crisis information provided to pt and she was encouraged to share information with support network, ask questions, and talk about any concerns relating to SPE.  Have you used any form of tobacco in the last 30 days? (Cigarettes, Smokeless Tobacco, Cigars, and/or Pipes): No  Has patient been referred to the Quitline?: Patient is nonsmoker  Patient has been referred for addiction treatment: Yes  Smart, Ladawna Walgren LCSW 10/24/2015, 9:47 AM

## 2015-10-24 NOTE — [facility]HH Suicide Risk Assessment (Signed)
[facility] Discharge Suicide Risk Assessment   Principal Problem: MDD (major depressive disorder), recurrent episode, severe ([place]) Discharge Diagnoses:  Patient Active Problem List   Diagnosis Date Noted  . Severe episode of recurrent major depressive disorder, without psychotic features ([place]) [F33.2]   . MDD (major depressive disorder), recurrent episode, severe ([place]) [F33.2] 10/20/2015  . Opioid use disorder, moderate, dependence ([place]) [F11.20] 10/20/2015  . Chronic pain syndrome [G89.4] 10/20/2015  . Low back pain [M54.5] 07/22/2015  . Excessive daytime sleepiness [G47.19] 10/28/2014  . Left cervical radiculopathy [M54.12] 04/02/2014  . Insomnia [G47.00] 12/01/2013  . Migraine without aura [G43.009] 08/04/2013  . Anxiety and depression [F41.8] 03/25/2013  . Hyperlipidemia [E78.5] 03/11/2012  . Preventive measure [Z29.9] 03/10/2012  . Obesity [E66.9] 03/10/2012  . 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  All other systems reviewed and are negative.   Blood pressure 111/66, pulse 97, temperature 97.7 F (36.5 C), temperature source Oral, resp. rate 18, height 5\' 2"  (1.575 m), weight 114.306 kg (252 lb), last menstrual period 10/06/2015, SpO2 100 %.Body mass index is 46.08 kg/(m^2).  General Appearance: Fairly Groomed  Engineer, water::  Fair  Speech:  Clear and Coherent409  Volume:  Normal  Mood:  Euthymic  Affect:  Congruent  Thought Process:  Goal Directed and Descriptions of Associations: Intact  Orientation:  Full (Time, Place, and Person)  Thought Content:  Logical  Suicidal Thoughts:  No  Homicidal Thoughts:  No  Memory:  Immediate;   Fair Recent;   Fair Remote;   Fair  Judgement:  Fair  Insight:  Fair  Psychomotor  Activity:  Normal  Concentration:  Fair  Recall:  [employer] of Knowledge:Fair  Language: Fair  Akathisia:  No  Handed:  Right  AIMS (if indicated):     Assets:  Communication Skills Desire for Improvement  Sleep:  Number of Hours: 6  Cognition: WNL  ADL's:  Intact   Mental Status Per Nursing Assessment::   On Admission:  NA  Demographic Factors:  Caucasian  Loss Factors: NA  Historical Factors: Impulsivity  Risk Reduction Factors:   Positive social support  Continued Clinical Symptoms:  Previous Psychiatric Diagnoses and Treatments  Cognitive Features That Contribute To Risk:  None    Suicide Risk:  Minimal: No identifiable suicidal ideation.  Patients presenting with no risk factors but with morbid ruminations; may be classified as minimal risk based on the severity of the depressive symptoms  Follow-up Information    Follow up with [facility]-Medication Management. Go on 10/28/2015.   Why:  Appt on this date at 3:30PM for medication management/hospital follow-up. Thank you.    Contact information:   ATTN: "Dr. Darene Lamer."  [address], [place], [place] [address] Phone: [phone]  Fax: [phone]      Follow up with [facility] (Counseling) On 11/14/2015.   Why:  Appt on this date at 1:00PM with Cordella Register for therapy. Thank you.    Contact information:   [address] [place],  [address] Phone: [phone] Fax: [phone]      Plan Of Care/Follow-up recommendations:  Activity:  no restrictions Diet:  regular Tests:  as needed Other:  follow up with aftercare  [place]o [place], MD 10/24/2015, 9:46 AM

## 2015-10-24 NOTE — Plan of Care (Signed)
Problem: Safety: Goal: Periods of time without injury will increase Outcome: Progressing Pt has not harmed self tonight.  She denies SI and verbally contracts for safety.

## 2015-10-24 NOTE — Progress Notes (Signed)
Recreation Therapy Notes  Date: 06.26.2017 Time: 9:30am Location: 300 Hall Group Room   Group Topic: Stress Management  Goal Area(s) Addresses:  Patient will actively participate in stress management techniques presented during session.   Behavioral Response: Did not attend.   Laureen Ochs Rosetta Rupnow, LRT/CTRS        Yandell Mcjunkins L 10/24/2015 10:16 AM

## 2015-10-24 NOTE — Progress Notes (Signed)
Patient did attend the evening speaker AA meeting.  

## 2015-10-24 NOTE — Progress Notes (Signed)
D: Pt was in hallway upon initial approach.  Pt has appropriate affect and pleasant mood.  She reports her day was "very good" and "I had a good weekend."  Pt's goal was to "attend all groups and participate fully."  Pt met her goal.  Pt denies SI/HI, denies hallucinations, denies pain.  Pt has been visible in milieu interacting with peers and staff appropriately.  Pt attended evening group.   A: Introduced self to pt.  Met with pt and offered support and encouragement.  Actively listened to pt.  Medications administered per order.  PRN medication administered for muscle spasms. R: Pt is compliant with medications.  Pt verbally contracts for safety.  Will continue to monitor and assess.

## 2015-10-24 NOTE — Discharge Summary (Signed)
Physician Discharge Summary Note  Patient:  Regina [name] is an 40 y.o., female MRN:  [id number] DOB:  [birth year] Patient phone:  [phone] (home)  Patient address:   [place] [address],  Total Time spent with patient: 30 minutes  Date of Admission:  10/20/2015 Date of Discharge: 10/24/2015  Reason for Admission: Per H&P-This is an admission assessment for Regina [name], a 40 year old Caucasian female. Ad[facility] to [facility] ED with complaints of seizure activities & suspected drug overdose. During this assessment, Regina [name] reports, "I had a seizure activity yesterday. Prior to having the seizure activity, I was in a lot of pain. I have chronic back pain. So, I took some tramadol tablets, (6 of100 mg tablets) within 12 hours. It caused me to have the seizure. My children found me on the floor, they called my husband who called the ambulance. The ambulance took me to the ED. I was basically monitored at the ED. I don't have seizure disorder. I don't abuse drugs. I don't have alcohol problems & I don't smoke cigarettes. I'm not depressed, but I was diagnosed with Anxiety disorder a long time ago & I take SSRI for it. I'm not suicidal. I have never attempted suicide. I don't hear voices, but I sleep poorly at night. I will need something to help me sleep at night. My doctor is [place], [place]".  Principal Problem: MDD (major depressive disorder), recurrent episode, severe [facility]) Discharge Diagnoses: Patient Active Problem List   Diagnosis Date Noted  . Severe episode of recurrent major depressive disorder, without psychotic features ([place]) [F33.2]   . MDD (major depressive disorder), recurrent episode, severe ([place]) [F33.2] 10/20/2015  . Opioid use disorder, moderate, dependence ([place]) [F11.20] 10/20/2015  . Chronic pain syndrome [G89.4] 10/20/2015  . Low back pain [M54.5] 07/22/2015  . Excessive daytime sleepiness [G47.19] 10/28/2014  . Left cervical radiculopathy  [M54.12] 04/02/2014  . Insomnia [G47.00] 12/01/2013  . Migraine without aura [G43.009] 08/04/2013  . Anxiety and depression [F41.8] 03/25/2013  . Hyperlipidemia [E78.5] 03/11/2012  . Preventive measure [Z29.9] 03/10/2012  . Obesity [E66.9] 03/10/2012  . 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 Migraine   . Depression with anxiety   . Panic disorder   . Morbid obesity ([place])   . Depression   . Seizures [facility])     Past Surgical History  Procedure Laterality Date  . Soft tissue tumor resection  06/17/07    chest/ wound reclosure 4/09'  . Dilation and curettage, diagnostic / therapeutic    . 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 Hypertension Mother   . Migraines Mother   . Depression Father   . Suicidality Father    Family Psychiatric  History: See Above Social History:  History  Alcohol Use No     History  Drug Use No    Social History   Social History  . Marital Status: Married    Spouse Name: N/A  . Number of Children: N/A  . Years of Education: N/A   Social History Main Topics  . Smoking status: Never Smoker   . Smokeless tobacco: None  . Alcohol Use: No  . Drug Use: No  . Sexual Activity: Yes    Birth Control/ Protection: Condom   Other Topics Concern  .  None   Social History Narrative    Hospital Course:  Regina [name] was admitted for MDD (major depressive disorder), recurrent episode, severe ([place]) and crisis management.  Pt was treated discharged with the medications listed below under Medication List.  Medical problems were identified and treated as needed.  Home medications were restarted as appropriate.  Improvement was monitored by observation and Regina [name] 's daily report of symptom reduction.  Emotional and mental status was monitored by daily self-inventory reports completed by  Regina [name] and clinical staff.         Regina [name] was evaluated by the treatment team for stability and plans for continued recovery upon discharge. Regina [name] 's motivation was an integral factor for scheduling further treatment. Employment, transportation, bed availability, health status, family support, and any pending legal issues were also considered during hospital stay. Pt was offered further treatment options upon discharge including but not limited to Residential, Intensive Outpatient, and Outpatient treatment.  Regina [name] will follow up with the services as listed below under Follow Up Information.     Upon completion of this admission the patient was both mentally and medically stable for discharge denying suicidal/homicidal ideation, auditory/visual/tactile hallucinations, delusional thoughts and paranoia.   Regina [name] responded well to treatment with  Celexa 40 mg,and Wellbutrin 150 mg and Vistaril 25 mg without adverse effects. Pt demonstrated improvement without reported or observed adverse effects to the point of stability appropriate for outpatient management. Pertinent labs include:BMP and Lipid panel  for which outpatient follow-up is necessary for lab recheck as mentioned below. Reviewed CBC, CMP, BAL, and UDS; all unremarkable aside from noted exceptions.   Physical Findings: AIMS: Facial and Oral Movements Muscles of Facial Expression: None, normal Lips and Perioral Area: None, normal Jaw: None, normal Tongue: None, normal,Extremity Movements Upper (arms, wrists, hands, fingers): None, normal Lower (legs, knees, ankles, toes): None, normal, Trunk Movements Neck, shoulders, hips: None, normal, Overall Severity Severity of abnormal movements (highest score from questions above): None, normal Incapacitation due to abnormal movements: None, normal Patient's awareness of abnormal movements (rate only patient's report): No Awareness, Dental Status Current problems with  teeth and/or dentures?: No Does patient usually wear dentures?: No  CIWA:  CIWA-Ar Total: 1 COWS:  COWS Total Score: 2  Musculoskeletal: Strength & Muscle Tone: within normal limits Gait & Station: normal Patient leans: N/A  Psychiatric Specialty Exam: Physical Exam  Nursing note and vitals reviewed. Constitutional: She is oriented to person, place, and time. She appears well-developed.  Cardiovascular: Normal rate.   Musculoskeletal: Normal range of motion.  Neurological: She is alert and oriented to person, place, and time.  Psychiatric: She has a normal mood and affect. Her behavior is normal.    Review of Systems  Psychiatric/Behavioral: Negative for suicidal ideas and hallucinations. Depression: stable. Nervous/anxious: stable.   All other systems reviewed and are negative.   Blood pressure 111/66, pulse 97, temperature 97.7 F (36.5 C), temperature source Oral, resp. rate 18, height 5\' 2"  (1.575 m), weight 114.306 kg (252 lb), last menstrual period 10/06/2015, SpO2 100 %.Body mass index is 46.08 kg/(m^2).    Have you used any form of tobacco in the last 30 days? (Cigarettes, Smokeless Tobacco, Cigars, and/or Pipes): No  Has this patient used any form of tobacco in the last 30 days? (Cigarettes, Smokeless Tobacco, Cigars, and/or Pipes) Yes, No  Blood Alcohol level:  Lab Results  Component Value Date   [facility] <5 10/19/2015  Metabolic Disorder Labs:  Lab Results  Component Value Date   HGBA1C 5.0 10/26/2014   MPG 97 10/26/2014   MPG 100 03/10/2012   No results found for: PROLACTIN Lab Results  Component Value Date   CHOL 198 10/26/2014   TRIG 125 10/26/2014   HDL 41* 10/26/2014   CHOLHDL 4.8 10/26/2014   VLDL 25 10/26/2014   LDLCALC 132* 10/26/2014   LDLCALC 125* 03/25/2013    See Psychiatric Specialty Exam and Suicide Risk Assessment completed by Attending Physician prior to discharge.  Discharge destination:  Home  Is patient on multiple antipsychotic  therapies at discharge:  No   Has Patient had three or more failed trials of antipsychotic monotherapy by history:  No  Recommended Plan for Multiple Antipsychotic Therapies: NA  Discharge Instructions    Activity as tolerated - No restrictions    Complete by:  As directed      Diet general    Complete by:  As directed      Discharge instructions    Complete by:  As directed   Take all medications as prescribed. Keep all follow-up appointments as scheduled.  Do not consume alcohol or use illegal drugs while on prescription medications. Report any adverse effects from your medications to your primary care provider promptly.  In the event of recurrent symptoms or worsening symptoms, call 911, a crisis hotline, or go to the nearest emergency department for evaluation.            Medication List    STOP taking these medications        ARIPiprazole 5 MG tablet  Commonly known as:  ABILIFY     buPROPion 150 MG 24 hr tablet  Commonly known as:  WELLBUTRIN XL     cyclobenzaprine 10 MG tablet  Commonly known as:  FLEXERIL     ibuprofen 200 MG tablet  Commonly known as:  ADVIL,MOTRIN     Melatonin 10 MG Tabs     multivitamin tablet     rizatriptan 10 MG tablet  Commonly known as:  MAXALT     topiramate 200 MG tablet  Commonly known as:  TOPAMAX     traMADol 50 MG tablet  Commonly known as:  ULTRAM      TAKE these medications      Indication   ALPRAZolam 0.5 MG tablet  Commonly known as:  XANAX  TAKE 1 TABLET BY MOUTH 3 TIMES A DAY      citalopram 40 MG tablet  Commonly known as:  CELEXA  Take 1 tablet (40 mg total) by mouth daily.   Indication:  mood control     hydrOXYzine 25 MG tablet  Commonly known as:  ATARAX/VISTARIL  Take 1 tablet (25 mg total) by mouth every 6 (six) hours as needed for anxiety.   Indication:  Anxiety Neurosis     traZODone 150 MG tablet  Commonly known as:  DESYREL  Take 1 tablet (150 mg total) by mouth at bedtime.   Indication:   Trouble Sleeping           Follow-up Information    Follow up with [facility]-Medication Management. Go on 10/28/2015.   Why:  Education officer, museum will confirm time of your appointment   Contact information:   ATTN: "Dr. Darene Lamer."  [address], [place], [place] [address] Phone: [phone]       Follow-up recommendations:  Activity:  as tolerated  Diet:  as reccommend by Primary  Care Provider  Comments:  Take all medications as prescribed. Keep all follow-up appointments as scheduled.  Do not consume alcohol or use illegal drugs while on prescription medications. Report any adverse effects from your medications to your primary care provider promptly.  In the event of recurrent symptoms or worsening symptoms, call 911, a crisis hotline, or go to the nearest emergency department for evaluation.   Signed: Derrill [place], NP 10/24/2015, 9:27 AM

## 2015-10-24 NOTE — [facility] Suicide Risk Assessment (Addendum)
[facility] INPATIENT:  Family/Significant Other Suicide Prevention Education  Suicide Prevention Education:  Contact Attempts: Katerina Ehrenberg (pt's husband) [phone] has been identified by the patient as the family member/significant other with whom the patient will be residing, and identified as the person(s) who will aid the patient in the event of a mental health crisis.  With written consent from the patient, two attempts were made to provide suicide prevention education, prior to and/or following the patient's discharge.  We were unsuccessful in providing suicide prevention education.  A suicide education pamphlet was given to the patient to share with family/significant other.  Date and time of first attempt: 8:30AM (10/24/15) Date and time of second attempt:* 9:45AM (10/24/15) voicemail left requesting call back at his earliest convenience.   Smart, Justus Droke LCSW 10/24/2015, 9:46 AM   CSW reached pt's husband on his cell phone: [phone]. SPE completed and aftercare plan discussed. He verbalized understanding of above information and has no other questions or concerns about pt's discharge today. He stated that he and pt's mother will be monitoring her daily medication management and "getting a locked medicine cabinet."   Maxie Better, MSW, LCSW Clinical Social Worker 10/24/2015 11:18 AM

## 2015-10-24 NOTE — Progress Notes (Signed)
Regina [name] was D/C from the unit to lobby accompanied by her husband.  She was pleasant and cooperative. Voiced no SI/HI or A/V halluciations.  Pt. Denies any pain or discomfort.  D/C instructions and medications reviewed with pt.  Pt. verbalized understanding of medications and d/c instructions.   All belongings from locker 35 returned to pt. Q 15 min checks maintained until discharge.  She left the unit in no apparent distress.

## 2015-10-26 MED FILL — traZODone HCL 150 MG TABS: 150 | 30 days supply | Qty: 30 | Fill #1

## 2015-10-28 ENCOUNTER — Ambulatory Visit (INDEPENDENT_AMBULATORY_CARE_PROVIDER_SITE_OTHER): Payer: 59 | Admitting: Sports Medicine

## 2015-10-28 VITALS — BP 142/85 | HR 59 | Wt 249.0 lb

## 2015-10-28 DIAGNOSIS — M545 Low back pain, unspecified: Secondary | ICD-10-CM

## 2015-10-28 DIAGNOSIS — N62 Hypertrophy of breast: Secondary | ICD-10-CM | POA: Insufficient documentation

## 2015-10-28 DIAGNOSIS — F418 Other specified anxiety disorders: Secondary | ICD-10-CM | POA: Diagnosis not present

## 2015-10-28 DIAGNOSIS — F419 Anxiety disorder, unspecified: Principal | ICD-10-CM

## 2015-10-28 DIAGNOSIS — E669 Obesity, unspecified: Secondary | ICD-10-CM

## 2015-10-28 DIAGNOSIS — F32A Depression, unspecified: Secondary | ICD-10-CM

## 2015-10-28 DIAGNOSIS — F329 Major depressive disorder, single episode, unspecified: Secondary | ICD-10-CM

## 2015-10-28 MED ORDER — LORCASERIN HCL ER 20 MG PO TB24: 1.0000 | ORAL_TABLET | Freq: Every day | ORAL | Status: DC

## 2015-10-28 MED ORDER — AMBULATORY NON FORMULARY MEDICATION: Status: DC

## 2015-10-28 NOTE — Assessment & Plan Note (Signed)
MRI shows a bit of facet arthritis but essentially no degenerative disc disease. She does have macromastia, we will get her set with plastic surgery to consider breast reduction surgery.

## 2015-10-28 NOTE — Assessment & Plan Note (Addendum)
Starting Belviq. No response to phentermine, intolerant of Wellbutrin.

## 2015-10-28 NOTE — Progress Notes (Signed)
  Subjective:    CC:  Follow-up multiple issues  HPI: Depression: Recent suicide attempt with overdose of tramadol, she ended up having a generalized tonic-clonic seizure with tongue biting and incontinence, she was admitted at behavioral [facility] and has since been stabilized. On further questioning she came off of her Abilify. Her intent was not to kill herself however she had severe pain and was simply trying to eliminate the pain.  She does have follow-up with psychiatry in about 2 weeks.  Macromastia: Desires referral for breast reduction surgery. She would probably defer it for now, but is okay to do the consult.  Back pain: MRI was negative with the exception of some mild facet arthritis, pain was axial, likely depression related.  Past medical history, Surgical history, Family history not pertinant except as noted below, Social history, Allergies, and medications have been entered into the medical record, reviewed, and no changes needed.   Review of Systems: No fevers, chills, night sweats, weight loss, chest pain, or shortness of breath.   Objective:    General: Well Developed, well nourished, and in no acute distress.  Neuro: Alert and oriented x3, extra-ocular muscles intact, sensation grossly intact.  HEENT: Normocephalic, atraumatic, pupils equal round reactive to light, neck supple, no masses, no lymphadenopathy, thyroid nonpalpable.  Skin: Warm and dry, no rashes. Cardiac: Regular rate and rhythm, no murmurs rubs or gallops, no lower extremity edema.  Respiratory: Clear to auscultation bilaterally. Not using accessory muscles, speaking in full sentences.  Impression and Recommendations:   I spent 25 minutes with this patient, greater than 50% was face-to-face time counseling regarding the above diagnoses

## 2015-10-28 NOTE — Assessment & Plan Note (Signed)
Recent suicide attempt with tramadol overdose, seizure, and stay at behavioral [facility]. She did self discontinue her Abilify prior. Her husband will continue directly observed therapy, he is a Therapist, sports. She will follow-up with her psychiatrist.

## 2015-10-28 NOTE — Assessment & Plan Note (Signed)
Referral to plastic surgery

## 2015-11-01 ENCOUNTER — Encounter: Payer: Self-pay | Admitting: Sports Medicine

## 2015-11-02 MED ORDER — CYCLOBENZAPRINE HCL 10 MG PO TABS: ORAL_TABLET | ORAL | Status: DC

## 2015-11-02 MED FILL — CYCLOBENZAPRINE 10 MG TAB: 10 | 12 days supply | Qty: 30 | Fill #0

## 2015-11-11 MED FILL — CITALOPRAM HBR 40 MG TABLET: 40 | 30 days supply | Qty: 30 | Fill #0

## 2015-11-14 ENCOUNTER — Encounter (HOSPITAL_COMMUNITY): Payer: Self-pay | Admitting: Licensed Clinical Social Worker

## 2015-11-14 ENCOUNTER — Ambulatory Visit (INDEPENDENT_AMBULATORY_CARE_PROVIDER_SITE_OTHER): Payer: 59 | Admitting: Licensed Clinical Social Worker

## 2015-11-14 DIAGNOSIS — F331 Major depressive disorder, recurrent, moderate: Secondary | ICD-10-CM | POA: Diagnosis not present

## 2015-11-14 DIAGNOSIS — F411 Generalized anxiety disorder: Secondary | ICD-10-CM | POA: Diagnosis not present

## 2015-11-14 DIAGNOSIS — F1120 Opioid dependence, uncomplicated: Secondary | ICD-10-CM

## 2015-11-14 NOTE — Progress Notes (Addendum)
Comprehensive Clinical Assessment (CCA) Note  11/14/2015 VELEKA HUNNELL [id number]  Visit Diagnosis:      ICD-9-CM ICD-10-CM   1. Major depressive disorder, recurrent episode, moderate (HCC) 296.32 F33.1   2. Generalized anxiety disorder 300.02 F41.1   3. Opioid use disorder, moderate, dependence (HCC) 304.00 F11.20       CCA Part One  Part One has been completed on paper by the patient.  (See scanned document in Chart Review)  CCA Part Two A  Intake/Chief Complaint:  CCA Intake With Chief Complaint CCA Part Two Date: 11/14/15 CCA Part Two Time: 1246 Chief Complaint/Presenting Problem: Was tearful and said she had an anxiety about coming here. She has chronic back pain since March. They have gotten to the point where they don't think she needs surgical treatment. The option is to exercise, loss weight, but believe it is related depression. She has slight arthritis. Those treatment methods are not automatic. He originally was prescribed Tramadol for the headaches. He was reluctant to continue to prescribe. She started years ago but the prescription would last for months. She increased asking for the medication every 2-3 weeks and stopped giving it to her. She stated to order opiates off the Internet and she knows that it was not the right thing to do. One night she was hurting and was taking 2 to 1-2 hours. It was twice the dosage Dr. Darene Lamer gave her. The ER insisted that it was a suicide attempt. Patient relates that it was for the pain. Also her father committed suicide when she was 74 and she knows that she can't do it from children. She thinks that this is where her mental health problems stem from. She was traumatized when pregnant. She had a seizure, started turing blue, went to Er and then sent to behavioral health for five days. She relates that is how addiction starts that you start developing a tolerance to the medicane. Discharged 6/26, She hasn't taken opiate since she was discharged. Taking  NSAID since then.  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 Her house is not clean she feels for somebody not working. She feels like a "loser"  Collateral Involvement: [place] Gave, husband, intellect, got into [employer], if she detaches she can see things for what they are and can talk herself down from it. Individual's Strengths: raising her kids, generous person Individual's Preferences: She needs to talk about her dad and it really bothers her. It has been 15 years. She fears she would end up like that even if she would never do that to her children. She won't seek opiates out she believes because she was scared and had a negative impact on children and husband. With her dad she has a lot guilt. They were moving so she told him to call someone else. He was in mental hospital due to mental issues all his life. These things are triggers since they are part of childhood and now that she is doing it scares her.She wants to manage her anxiety and not hard on herself Individual's Abilities: technology, good at being there for her kids, her oldest daughter told her she is gay and a friend told her that they have a good relationship to talk about that, she  has a good relationship, organized, good at math Initial Clinical Notes/Concerns: Inpatient recently is only psychiatric treatment. Marlana Salvage started this when she missed the dosage and her voice said that she didn't need it. When she switched insurance they wanted to get maintenance medication, [employer] did not convey this, so insurance denied it, so couldn't take Ability and this helps her a lot. She had a break down in February and doctor prescribed Abilify and she said it really works. She has a pill keeper now. She has tried  to make some steps. She feels that going off meds led to risky and poor decisions..Panic attacks-anxiety for awhile, obsessive thinking, start to panic tightness in chest and trouble breaking-1 hour, 1-2 times a week, since a teenager, last one today about coming to therapy  Mental Health Symptoms Depression:  Depression: Change in energy/activity, Difficulty Concentrating, Sleep (too much or little), Fatigue, Hopelessness, Increase/decrease in appetite, Irritability, Tearfulness, Weight gain/loss, Worthlessness (insomnia, migranines, denies SI currently, she has a fear of suicide because of dad, she never things of dying because she wouldn't do that to her child, cause probllems for people she loves, she denies past SI or SA, at least consciously. She below)  Mania:  Mania: N/A  Anxiety:   Anxiety: Difficulty concentrating, Fatigue, Irritability, Restlessness, Sleep, Tension, Worrying (Anxiety feeds and starts the depression, hourly worry, interes with functioning)  Psychosis:  Psychosis: N/A  Trauma:  Trauma: Avoids reminders of event, Detachment from others, Difficulty staying/falling asleep, Emotional numbing, Guilt/shame, Irritability/anger, Re-experience of traumatic event (related to father's suiide)  Obsessions:  Obsessions: N/A  Compulsions:  Compulsions: N/A  Inattention:     Hyperactivity/Impulsivity:  Hyperactivity/Impulsivity: N/A  Oppositional/Defiant Behaviors:  Oppositional/Defiant Behaviors: N/A  Borderline Personality:  Emotional Irregularity: N/A  Other Mood/Personality Symptoms:  Other Mood/Personality Symtpoms: See depression-She realizes actions she took could to sellf-harm even if not intentional. She was engaged risky behaviors. that could have the same result as suicide   Mental Status Exam Appearance and self-care  Stature:  Stature: Small  Weight:  Weight: Obese  Clothing:  Clothing: Casual  Grooming:  Grooming: Normal  Cosmetic use:  Cosmetic Use: None   Posture/gait:  Posture/Gait: Normal  Motor activity:  Motor Activity: Not Remarkable  Sensorium  Attention:  Attention: Normal  Concentration:  Concentration: Normal  Orientation:  Orientation: X5  Recall/memory:  Recall/Memory: Normal  Affect and Mood  Affect:  Affect: Depressed  Mood:  Mood: Anxious, Depressed  Relating  Eye contact:  Eye Contact: Normal  Facial expression:  Facial Expression: Depressed (tearful)  Attitude toward examiner:  Attitude Toward Examiner: Cooperative  Thought and Language  Speech flow: Speech Flow: Normal  Thought content:  Thought Content: Appropriate to mood and circumstances  Preoccupation:     Hallucinations:     Organization:     Transport planner of Knowledge:  Fund of Knowledge: Average  Intelligence:  Intelligence: Average  Abstraction:  Abstraction: Normal  Judgement:  Judgement: Poor  Reality Testing:  Reality Testing: Realistic  Insight:  Insight: Fair  Decision Making:  Decision Making: Impulsive  Social Functioning  Social Maturity:  Social Maturity: Responsible  Social Judgement:  Social Judgement: Normal  Stress  Stressors:  Stressors: Work, Brewing technologist, Illness, Transitions  Coping Ability:     Skill Deficits:     Supports:      Family and Psychosocial History: Family history Number of Years Married: 78 What types of issues is patient dealing with in the relationship?: Has been feeling guilty  about not pulling her weight, husband has been reassuring Are you sexually active?: Yes What is your sexual orientation?: heterosexual Does patient have children?: Yes How many children?: 3 How is patient's relationship with their children?: 14 y.0, 12 y., 24 y.o Mostly a good relationship, they test occasionally  Childhood History:  Childhood History By whom was/is the patient raised?: Mother, Father Additional childhood history information: Mother and Father divorced when patient 55 Description of patient's relationship  with caregiver when they were a child: unstable, father did not always work, it was hard for mother who had to get two jobs sometimes, he was there but not there because he was in bed all the time, and she doesn't want her kids to say that about her, when older he worked Insurance underwriter. He would stay in bed from the moment he got home until he had to go back. Mom was always gone, it was fine when she was there, got irritated sometimes when she asked them to do stuff like going to church or going to sleep, dad-he would spank them more her sister, got on restriction where they couldn't use the phone or television, he was mostly in the bed and she was quiet so not to get into trouble Patient's description of current relationship with people who raised him/her: Father committed suicide when she was 63, she repaired her relationship with him more than sister, also liked her husband, got into a relationship and things went downhill, mom-loving supportive, stepdad-loving supported How were you disciplined when you got in trouble as a child/adolescent?: spanking, restrictions Does patient have siblings?: Yes Number of Siblings: 1 Description of patient's current relationship with siblings: younger, married to husband's brother-very close Did patient suffer from severe childhood neglect?: No Was the patient ever a victim of a crime or a disaster?: No Witnessed domestic violence?: No Has patient been effected by domestic violence as an adult?: No  CCA Part Two B  Employment/Work Situation: Employment / Work Copywriter, advertising Employment situation: Unemployed (last worked 2016 and going back to school to study accounting) What is the longest time patient has a held a job?: 13 years  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 Has patient ever been in the [employer]?: No Has patient ever served in combat?: No Did You Receive Any Psychiatric Treatment/Services While in Passenger transport manager?: No Are There  Guns or Other Weapons in [place]?: No  Education: Education School Currently Attending: Will be going to [employer] for accounting and wants to get a Master's Degree Last Grade Completed: 18 Name of High School: [employer] Did Teacher, adult education From [employer]?: Yes Did You Attend College?: Yes What Type of College Degree Do you Have?: B.A Did You Attend Graduate School?: Yes What is Your Post Graduate Degree?: Master in [place] What Was Your Major?: undergraduate-English, Runner, broadcasting/film/video Did You Have Any Special Interests In School?: going back to school for accounting Did You Have An Individualized Education Program (IIEP): No Did You Have Any Difficulty At School?: No  Religion: Religion/Spirituality Are You A Religious Person?: No  Leisure/Recreation:    Exercise/Diet: Exercise/Diet Do You Exercise?: Yes What Type of Exercise Do You Do?: Swimming How Many Times a Week Do You Exercise?: 1-3 times a week Have You Gained or Lost A Significant Amount of Weight in the Past Six Months?: Yes-Gained Number of Pounds Gained: 15 Do You Follow a Special Diet?: Yes Type of Diet: Trying to cut down on carbs Do You Have  Any Trouble Sleeping?: Yes Explanation of Sleeping Difficulties: not trouble falling asleep but staying asleep-she had a sleep study  CCA Part Two C  Alcohol/Drug Use: Alcohol / Drug Use Pain Medications: see med list Prescriptions: see med list Over the Counter: see med list History of alcohol / drug use?: Yes Withdrawal Symptoms: Seizures, Blackouts, Patient aware of relationship between substance abuse and physical/medical complications, Sweats Onset of Seizures: had one seizure on 10/19/15 and was hospitalized Substance #1 Name of Substance 1: Tramadol 1 - Age of First Use: 38-took as prescribed until 80months 1 - Amount (size/oz): 300 mg-took the max-she was taking more than she was used to talking, and went online to order more opiates 1 -  Frequency: daily 1 - Duration: 3 months 1 - Last Use / Amount: 10/19/15-600 mg had a seizure.Normal is 50 -100 took 200 mg in six hours                    CCA Part Three  ASAM's:  Six Dimensions of Multidimensional Assessment  Dimension 1:  Acute Intoxication and/or Withdrawal Potential:  Dimension 1:  Comments: No signs or symptoms of withdrawal  Dimension 2:  Biomedical Conditions and Complications:  Dimension 2:  Comments: Adequate ability to cope with physical discomfort  Dimension 3:  Emotional, Behavioral, or Cognitive Conditions and Complications:  Dimension 3:  Comments: Patient is diagnosed with major depressive disorder, recurrent, moderate and generalized anxiety disorder. They may distract from recovery but patient currently in session weekly to address symptoms  Dimension 4:  Readiness to Change:  Dimension 4:  Comments: Patient recognizes negative impact that abusing substances has caused in her life and reports abstinence from substances as well as motivation to learn new coping strategies  Dimension 5:  Relapse, Continued use, or Continued Problem Potential:  Dimension 5:  Comments: Mental mental relapse potential as patient has motivation to learn better coping strategies although she still has significant stressors in her life that may cause her to return to coping strategies  Dimension 6:  Recovery/Living Environment:  Dimension 6:  Recovery/Living Environment Comments: Patient is in a supportive environment   Substance use Disorder (SUD) Substance Use Disorder (SUD)  Checklist Symptoms of Substance Use: Continued use despite persistent or recurrent social, interpersonal problems, caused or exacerbated by use, Evidence of tolerance, Evidence of withdrawal (Comment), Large amounts of time spent to obtain, use or recover from the substance(s), Presence of craving or strong urge to use, Recurrent use that results in a fialure to fulfill major rule obligatinos (work, school,  home), Repeated use in physically hazardous situations, Social, occupational, recreational activities given up or reduced due to use, Substance(s) often taken in large amounts or over longer times than was intended  Social Function:  Social Functioning Social Maturity: Responsible Social Judgement: Normal  Stress:  Stress Stressors: Work, Brewing technologist, Illness, Transitions Patient Takes Medications The Way The Doctor Instructed?: Yes Priority Risk: Low Acuity  Risk Assessment- Self-Harm Potential: Risk Assessment For Self-Harm Potential Thoughts of Self-Harm: No current thoughts Method: No plan Availability of Means: No access/NA Additional Information for Self-Harm Potential: Family History of Suicide Additional Comments for Self-Harm Potential: Father committed suicide  Risk Assessment -Dangerous to Others Potential: Risk Assessment For Dangerous to Others Potential Method: No Plan Availability of Means: No access or NA Intent: Vague intent or NA Notification Required: No need or identified person  DSM5 Diagnoses: Patient Active Problem List   Diagnosis Date Noted  . Major depressive disorder,  recurrent episode, moderate ([place]) 11/14/2015  . Generalized anxiety disorder 11/14/2015  . 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 Severe episode of recurrent major depressive disorder, without psychotic features ([place])   . MDD (major depressive disorder), recurrent episode, severe ([place]) 10/20/2015  . Opioid use disorder, moderate, dependence ([place]) 10/20/2015  . Chronic pain syndrome 10/20/2015  . Low back pain 07/22/2015  . Excessive daytime sleepiness 10/28/2014  . Left cervical radiculopathy 04/02/2014  . Insomnia 12/01/2013  . Migraine without aura 08/04/2013  . Anxiety and depression 03/25/2013  . Hyperlipidemia 03/11/2012  . Preventive measure 03/10/2012  . Obesity 03/10/2012  . TERATOMA 03/07/2010    Patient Centered Plan: Patient is on the following Treatment Plan(s):  Anxiety,  Borderline Personality, Impulse Control, Low Self-Esteem and PTSD  Recommendations for Services/Supports/Treatments: Recommendations for Services/Supports/Treatments Recommendations For Services/Supports/Treatments: Individual Therapy, Medication Management  PH Q-9 = 13-moderate depression CAD-7= 15-severe anxiety Treatment Plan Summary: Patient is a 40 year old married female who was referred by her primary doctor for anxiety, drug abuse, depression, and insomnia.  After testing for her pain the doctors feel have recommended that she exercise and treat her depression Her anxiety is severe and sometimes results in panic attacks. The insomnia contributes to anxiety and other problems. She endorses depressive symptoms, denies SI and explained recent episode that resulted in her hospitalization for behavior health was not a suicide attempt but related to taking medication to relieve her pain. She resigned from her job last year and since then has had difficulty figuring out what she wants to do that she is going to school in the fall for accounting. She relates that thoughts about her dad who committed suicide is something she still needs to work through. She also has to work on self-esteem. Her SUDS score was an eight and dictating severe symptoms of addiction and relates she has been abusing opiates for the last three months. She will continue to receive medication management by her primary care doctor and she is recommended for therapy for learning effective coping strategies to manage her emotions, supportive interventions, work on self-esteem, utilize D/a symptoms and address PTSD symptoms.  Referrals to Alternative Service(s): Referred to Alternative Service(s):   Place:   Date:   Time:    Referred to Alternative Service(s):   Place:   Date:   Time:    Referred to Alternative Service(s):   Place:   Date:   Time:    Referred to Alternative Service(s):   Place:   Date:   Time:     Ronnica Dreese A

## 2015-11-15 ENCOUNTER — Encounter: Payer: Self-pay | Admitting: Sports Medicine

## 2015-11-20 ENCOUNTER — Other Ambulatory Visit: Payer: Self-pay | Admitting: Sports Medicine

## 2015-11-21 ENCOUNTER — Ambulatory Visit (INDEPENDENT_AMBULATORY_CARE_PROVIDER_SITE_OTHER): Payer: 59 | Admitting: Licensed Clinical Social Worker

## 2015-11-21 DIAGNOSIS — F331 Major depressive disorder, recurrent, moderate: Secondary | ICD-10-CM

## 2015-11-21 DIAGNOSIS — F1120 Opioid dependence, uncomplicated: Secondary | ICD-10-CM

## 2015-11-21 DIAGNOSIS — F411 Generalized anxiety disorder: Secondary | ICD-10-CM | POA: Diagnosis not present

## 2015-11-21 MED ORDER — CYCLOBENZAPRINE HCL 10 MG PO TABS: ORAL_TABLET | ORAL | 0 refills | Status: DC

## 2015-11-21 MED FILL — ARIPiprazole 5 MG TABS: 5 | 30 days supply | Qty: 30 | Fill #1

## 2015-11-21 MED FILL — CYCLOBENZAPRINE 10 MG TAB: 10 | 10 days supply | Qty: 30 | Fill #0

## 2015-11-21 NOTE — Progress Notes (Addendum)
   THERAPIST PROGRESS NOTE  Session Time: 4 PM to 4:55 PM  Participation Level: Active  Behavioral Response: Well GroomedAlertDepressed and Tearful  Type of Therapy: Individual Therapy, husband brought into session to provide input   Treatment Goals addressed: stabilize mood, build self-esteem, challenge unhelpful thought patterns   Interventions: Strength-based, Supportive, Family Systems and Reframing  Summary: Regina [name] is a 40 y.o. female who presents with husband, Regina [name] for session together. Patient discussed some of the initial steps she is taking to work on herself and implement positive coping skills. She discussed that one of her issues as problems with sleep. She is putting herself on a sleeping schedule and she is a morning person so she plans to wake up early in the morning. She feels this will be the chance for her to spend more times with her kids in the morning and this will make her feel more productive. She explains that resigning from her job last year was a loss that she has not worked through and became tearful in relating this to therapist. She feels this is one of the few things that she was good at and makes it difficult to not have the job and she also needs to feel productive.she also feels that she was impulsive, had a episode earlier this week where she reacted quickly and that it was something she could handle differently.  Relates depression is better but still some anxiety and pointed specifically to coming to therapy session today as she is felt she has needed to come for a while. Her pain is better and only some back problems when working around the house. She plans to start exercising and feels swimming would be in good exercise and believes that she takes off the weight this will help with pain issues to.   Husband in session and related that he would like to see patient more stable through stabilizing of mood, and feels self-esteem as an issue for patient. He  provided objective evidence that issues with patient was job were followed of patient and also discussed how in the recent past patient had not been present with family.  Suicidal/Homicidal: No  Therapist Response: Worked with patient on reframing so that she does not take all the responsibility for job loss. Discussed with patient that value does not come from externals otherwise we are vulnerable vulnerable to external. Explained that worth comes from an internal source and is unconditional. Help process patient's feelings related to loss of job and discuss how it will help build resiliency. Provided strength-based interventions particularly has patient will work on building self-esteem. Help patient to normalize some of the experiences of early recovery and that patient is now starting to deal with feelings which she may have suppressed. Explained that one is trying to figure out who they are and that anger is an emotion people experience frequently in early recovery. Identified as patient's need to work on anger management and also completed treatment plan with patient is going to work on mood stability, processing of trauma and grief and work on self-esteem.  Husband in session that provided supportive interventions for patient's treatment.  Plan: 1.Return again in 2 weeks.2. Patient learn and apply mood regulation skills major depressive disorder, recurrent, moderate, opiate use disorder, moderate, dependence, generalized anxiety disorder  Diagnosis: Axis I: major depressive disorder, recurrent, moderate, opiate use disorder, moderate, dependence, generalized anxiety disorder     Axis II: No diagnosis    Regina [name] A, LCSW 11/21/2015

## 2015-11-25 ENCOUNTER — Ambulatory Visit: Payer: Self-pay | Admitting: Sports Medicine

## 2015-12-06 ENCOUNTER — Ambulatory Visit (HOSPITAL_COMMUNITY): Payer: Self-pay | Admitting: Licensed Clinical Social Worker

## 2015-12-15 ENCOUNTER — Encounter: Payer: Self-pay | Admitting: Sports Medicine

## 2015-12-20 ENCOUNTER — Ambulatory Visit (HOSPITAL_COMMUNITY): Payer: Self-pay | Admitting: Licensed Clinical Social Worker

## 2015-12-30 ENCOUNTER — Ambulatory Visit: Payer: Self-pay | Admitting: Sports Medicine

## 2015-12-30 ENCOUNTER — Other Ambulatory Visit: Payer: Self-pay

## 2015-12-30 ENCOUNTER — Encounter: Payer: Self-pay | Admitting: Sports Medicine

## 2015-12-30 MED ORDER — CYCLOBENZAPRINE HCL 10 MG PO TABS: ORAL_TABLET | ORAL | 0 refills | Status: DC

## 2015-12-30 MED FILL — CYCLOBENZAPRINE 10 MG TAB: 10 | 10 days supply | Qty: 30 | Fill #0

## 2016-01-09 ENCOUNTER — Telehealth: Payer: Self-pay | Admitting: Sports Medicine

## 2016-01-09 NOTE — Telephone Encounter (Signed)
Noted, they should proceed with emergency evaluation and treatment. She is not on any medications currently that are controlled substances beyond schedule IV.

## 2016-01-09 NOTE — Telephone Encounter (Signed)
Pt's husband called triage line, left VM stating Pt is having a possible drug overdose. Did not leave information regarding what Rx or any symptoms. Attempted callback, no answer. Left VM for husband to return clinic call and/or to call 911 if overdose is suspected.

## 2016-01-11 ENCOUNTER — Encounter: Payer: Self-pay | Admitting: Sports Medicine

## 2016-01-11 ENCOUNTER — Other Ambulatory Visit: Payer: Self-pay | Admitting: Sports Medicine

## 2016-01-11 MED ORDER — HYDROXYZINE HCL 50 MG PO TABS: 50.0000 mg | ORAL_TABLET | Freq: Every day | ORAL | 3 refills | Status: DC

## 2016-01-11 MED FILL — hydrOXYzine HCL 50 MG TABS: 50 | 90 days supply | Qty: 90 | Fill #0 | Status: TO

## 2016-01-14 ENCOUNTER — Encounter: Payer: Self-pay | Admitting: Sports Medicine

## 2016-01-18 MED FILL — ARIPiprazole 5 MG TABS: 5 | 30 days supply | Qty: 30 | Fill #2

## 2016-01-18 MED FILL — CITALOPRAM HBR 40 MG TABLET: 40 | 30 days supply | Qty: 30 | Fill #1

## 2016-02-09 ENCOUNTER — Encounter: Payer: Self-pay | Admitting: Sports Medicine

## 2016-02-09 ENCOUNTER — Other Ambulatory Visit: Payer: Self-pay

## 2016-02-09 MED ORDER — CYCLOBENZAPRINE HCL 10 MG PO TABS: ORAL_TABLET | ORAL | 0 refills | Status: DC

## 2016-02-09 MED FILL — CYCLOBENZAPRINE 10 MG TAB: 10 | 10 days supply | Qty: 30 | Fill #0

## 2016-02-16 MED FILL — ARIPiprazole 5 MG TABS: 5 | 30 days supply | Qty: 30 | Fill #3 | Status: TO

## 2016-02-28 MED FILL — CITALOPRAM HBR 40 MG TABLET: 40 | 30 days supply | Qty: 30 | Fill #2 | Status: TO

## 2016-03-06 ENCOUNTER — Encounter: Payer: Self-pay | Admitting: Sports Medicine

## 2016-04-13 ENCOUNTER — Other Ambulatory Visit: Payer: Self-pay | Admitting: Sports Medicine

## 2016-04-13 MED ORDER — ARIPIPRAZOLE 5 MG PO TABS: 5.0000 mg | ORAL_TABLET | Freq: Every day | ORAL | 3 refills | Status: DC

## 2016-04-13 MED FILL — ARIPiprazole 5 MG TABS: 5 | 90 days supply | Qty: 90 | Fill #0

## 2016-04-16 ENCOUNTER — Other Ambulatory Visit: Payer: Self-pay | Admitting: Sports Medicine

## 2016-04-16 ENCOUNTER — Encounter: Payer: Self-pay | Admitting: Sports Medicine

## 2016-04-17 ENCOUNTER — Encounter: Payer: Self-pay | Admitting: Sports Medicine

## 2016-04-17 MED ORDER — RIZATRIPTAN BENZOATE 5 MG PO TBDP: 5.0000 mg | ORAL_TABLET | ORAL | 0 refills | Status: DC | PRN

## 2016-04-18 ENCOUNTER — Ambulatory Visit: Payer: Self-pay | Admitting: Sports Medicine

## 2016-04-18 MED ORDER — TRAZODONE HCL 150 MG PO TABS: 150.0000 mg | ORAL_TABLET | Freq: Every day | ORAL | 3 refills | Status: DC

## 2016-04-18 MED FILL — traZODone HCL 150 MG TABS: 150 | 90 days supply | Qty: 90 | Fill #0

## 2016-05-04 ENCOUNTER — Emergency Department (INDEPENDENT_AMBULATORY_CARE_PROVIDER_SITE_OTHER)
Admission: EM | Admit: 2016-05-04 | Discharge: 2016-05-04 | Disposition: A | Discharge status: Home / Self Care | Payer: Managed Care, Other (non HMO) | Source: Home / Self Care | Attending: Family Medicine | Admitting: Family Medicine

## 2016-05-04 ENCOUNTER — Encounter: Payer: Self-pay | Admitting: *Deleted

## 2016-05-04 DIAGNOSIS — J209 Acute bronchitis, unspecified: Secondary | ICD-10-CM | POA: Diagnosis not present

## 2016-05-04 MED ORDER — BENZONATATE 200 MG PO CAPS: ORAL_CAPSULE | ORAL | 0 refills | Status: DC

## 2016-05-04 MED ORDER — DOXYCYCLINE HYCLATE 100 MG PO CAPS: 100.0000 mg | ORAL_CAPSULE | Freq: Two times a day (BID) | ORAL | 0 refills | Status: DC

## 2016-05-04 NOTE — Discharge Instructions (Signed)
Take plain guaifenesin (1200mg  extended release tabs such as Mucinex) twice daily, with plenty of water, for cough and congestion.  May add Pseudoephedrine (30mg , one or two every 4 to 6 hours) for sinus congestion.  Get adequate rest.   May use Afrin nasal spray (or generic oxymetazoline) twice daily for about 5 days and then discontinue.  Also recommend using saline nasal spray several times daily and saline nasal irrigation (AYR is a common brand).  Use Flonase nasal spray each morning after using Afrin nasal spray and saline nasal irrigation. Try warm salt water gargles for sore throat.  Stop all antihistamines for now, and other non-prescription cough/cold preparations. May take Ibuprofen 200mg , 4 tabs every 8 hours with food for body aches, headache, sore throat, etc.   Follow-up with family doctor if not improving about 7 to 10 days.

## 2016-05-04 NOTE — ED Provider Notes (Signed)
[facility]    CSN: [id number] Arrival date & time: 05/04/16  1359     History   Chief Complaint Chief Complaint  Patient presents with  . Cough    HPI Regina [name] is a 41 y.o. female.   Patient complains of five day history of typical cold-like symptoms developing over several days, including mild sore throat, sinus congestion, headache, myalgias, fatigue, and cough.  She developed chills yesterday.  Her cough is productive, and worse at night.   The history is provided by the patient.    Past Medical History:  Diagnosis Date  . Depression   . Depression with anxiety   . Migraine   . Morbid obesity ([place])   . Panic disorder   . Seizures [facility])     Patient Active Problem List   Diagnosis Date Noted  . Major depressive disorder, recurrent episode, moderate ([place]) 11/14/2015  . Generalized anxiety disorder 11/14/2015  . 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 Severe episode of recurrent major depressive disorder, without psychotic features ([place])   . MDD (major depressive disorder), recurrent episode, severe ([place]) 10/20/2015  . Opioid use disorder, moderate, dependence ([place]) 10/20/2015  . Chronic pain syndrome 10/20/2015  . Low back pain 07/22/2015  . Excessive daytime sleepiness 10/28/2014  . Left cervical radiculopathy 04/02/2014  . Insomnia 12/01/2013  . Migraine without aura 08/04/2013  . Anxiety and depression 03/25/2013  . Hyperlipidemia 03/11/2012  . Preventive measure 03/10/2012  . Obesity 03/10/2012  . TERATOMA 03/07/2010    Past Surgical History:  Procedure Laterality Date  . 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 DILATION AND CURETTAGE, DIAGNOSTIC / THERAPEUTIC    . SOFT TISSUE TUMOR RESECTION  06/17/07   chest/ wound reclosure 4/09'    OB History    Gravida Para Term Preterm AB Living   5 3 3    0 3   SAB TAB Ectopic Multiple Live Births                   Home Medications    Prior to Admission medications   Medication Sig Start Date End  Date Taking? Authorizing Provider  ARIPiprazole (ABILIFY) 5 MG tablet Take 1 tablet (5 mg total) by mouth daily. 04/13/16  Yes Silverio Decamp, MD  citalopram (CELEXA) 40 MG tablet Take 1 tablet (40 mg total) by mouth daily. 10/24/15  Yes Derrill Center, NP  AMBULATORY NON FORMULARY MEDICATION Professional massage 2-3 times per week. Patient not taking: Reported on 11/14/2015 10/28/15   Silverio Decamp, MD  benzonatate (TESSALON) 200 MG capsule Take one cap by mouth at bedtime as needed for cough.  May repeat in 4 to 6 hours 05/04/16   Kandra Nicolas, MD  doxycycline (VIBRAMYCIN) 100 MG capsule Take 1 capsule (100 mg total) by mouth 2 (two) times daily. Take with food. 05/04/16   Kandra Nicolas, MD  hydrOXYzine (ATARAX/VISTARIL) 50 MG tablet Take 1 tablet (50 mg total) by mouth at bedtime. 01/11/16   Silverio Decamp, MD  Lorcaserin HCl ER (BELVIQ XR) 20 MG TB24 Take 1 tablet by mouth daily. 10/28/15   Silverio Decamp, MD  rizatriptan (MAXALT-MLT) 5 MG disintegrating tablet Take 1 tablet (5 mg total) by mouth as needed for migraine. May repeat in 2 hours if needed 04/17/16   Silverio Decamp, MD  traZODone (DESYREL) 150 MG tablet Take 1 tablet (150 mg total) by mouth at bedtime. 04/18/16   Silverio Decamp, MD  Family History Family History  Problem Relation Age of Onset  . Hypertension Mother   . Migraines Mother   . Depression Father   . Suicidality Father     Social History Social History  Substance Use Topics  . Smoking status: Never Smoker  . Smokeless tobacco: Never Used  . Alcohol use No     Allergies   Codeine and Sumatriptan   Review of Systems Review of Systems + sore throat + cough No pleuritic pain No wheezing + nasal congestion + post-nasal drainage No sinus pain/pressure No itchy/red eyes No earache No hemoptysis No SOB No fever, + chills No nausea No vomiting No abdominal pain No diarrhea No urinary symptoms No skin  rash + fatigue + myalgias + headache Used OTC meds without relief   Physical Exam Triage Vital Signs ED Triage Vitals  Enc Vitals Group     BP 05/04/16 1434 168/82     Pulse Rate 05/04/16 1434 111     Resp 05/04/16 1434 18     Temp 05/04/16 1434 97.7 F (36.5 C)     Temp Source 05/04/16 1434 Oral     SpO2 05/04/16 1434 96 %     Weight 05/04/16 1434 284 lb (128.8 kg)     Height --      Head Circumference --      Peak Flow --      Pain Score 05/04/16 1435 0     Pain Loc --      Pain Edu? --      Excl. in [place]? --    No data found.   Updated Vital Signs BP 168/82 (BP Location: Left Arm)   Pulse 111   Temp 97.7 F (36.5 C) (Oral)   Resp 18   Wt 284 lb (128.8 kg)   LMP 04/08/2016   SpO2 96%   BMI 51.94 kg/m   Visual Acuity Right Eye Distance:   Left Eye Distance:   Bilateral Distance:    Right Eye Near:   Left Eye Near:    Bilateral Near:     Physical Exam Nursing notes and Vital Signs reviewed. Appearance:  Patient appears stated age, and in no acute distress Eyes:  Pupils are equal, round, and reactive to light and accomodation.  Extraocular movement is intact.  Conjunctivae are not inflamed  Ears:  Canals normal.  Tympanic membranes normal.  Nose:  Mildly congested turbinates.  No sinus tenderness.   Pharynx:  Normal Neck:  Supple.  Tender enlarged posterior/lateral nodes are palpated bilaterally  Lungs:  Clear to auscultation.  Breath sounds are equal.  Moving air well. Heart:  Regular rate and rhythm without murmurs, rubs, or gallops.  Abdomen:  Nontender without masses or hepatosplenomegaly.  Bowel sounds are present.  No CVA or flank tenderness.  Extremities:  No edema.  Skin:  No rash present.    UC Treatments / Results  Labs (all labs ordered are listed, but only abnormal results are displayed) Labs Reviewed - No data to display  EKG  EKG Interpretation None       Radiology No results found.  Procedures Procedures (including critical  care time)  Medications Ordered in UC Medications - No data to display   Initial Impression / Assessment and Plan / UC Course  I have reviewed the triage vital signs and the nursing notes.  Pertinent labs & imaging results that were available during my care of the patient were reviewed by me and considered in my medical  decision making (see chart for details).  Clinical Course   Begin doxycycline 100mg  BID for atypical coverage. Prescription written for Benzonatate [facility]) to take at bedtime for night-time cough.  Take plain guaifenesin (1200mg  extended release tabs such as Mucinex) twice daily, with plenty of water, for cough and congestion.  May add Pseudoephedrine (30mg , one or two every 4 to 6 hours) for sinus congestion.  Get adequate rest.   May use Afrin nasal spray (or generic oxymetazoline) twice daily for about 5 days and then discontinue.  Also recommend using saline nasal spray several times daily and saline nasal irrigation (AYR is a common brand).  Use Flonase nasal spray each morning after using Afrin nasal spray and saline nasal irrigation. Try warm salt water gargles for sore throat.  Stop all antihistamines for now, and other non-prescription cough/cold preparations. May take Ibuprofen 200mg , 4 tabs every 8 hours with food for body aches, headache, sore throat, etc.   Follow-up with family doctor if not improving about 7 to 10 days.      Final Clinical Impressions(s) / UC Diagnoses   Final diagnoses:  Acute bronchitis, unspecified organism    New Prescriptions New Prescriptions   BENZONATATE (TESSALON) 200 MG CAPSULE    Take one cap by mouth at bedtime as needed for cough.  May repeat in 4 to 6 hours   DOXYCYCLINE (VIBRAMYCIN) 100 MG CAPSULE    Take 1 capsule (100 mg total) by mouth 2 (two) times daily. Take with food.     Kandra Nicolas, MD 05/04/16 [phone]

## 2016-05-04 NOTE — ED Triage Notes (Signed)
Patient c/o productive cough x 3 days. Fever on days 1 and 2. T-max 102. Minimal sob. Taken Mucinex otc.

## 2016-05-14 ENCOUNTER — Ambulatory Visit (INDEPENDENT_AMBULATORY_CARE_PROVIDER_SITE_OTHER): Payer: Managed Care, Other (non HMO) | Admitting: Licensed Clinical Social Worker

## 2016-05-14 DIAGNOSIS — F1121 Opioid dependence, in remission: Secondary | ICD-10-CM

## 2016-05-14 DIAGNOSIS — F411 Generalized anxiety disorder: Secondary | ICD-10-CM

## 2016-05-14 DIAGNOSIS — F331 Major depressive disorder, recurrent, moderate: Secondary | ICD-10-CM | POA: Diagnosis not present

## 2016-05-14 NOTE — Progress Notes (Signed)
   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 She was taking accounting classes, not liking classes that contributed to depression her thinking was that she would never find a career. She is doing better since then and explains that she has given  herself time to figure out what she wants to do. Another thought but contributed to her depression was not wanting to be a burden to her husband. She recognizes this as her own thought distortion. She rates depression as a 2 or  3 out of ten with ten being the worst and now doing better. She is going to class, studying, taking care of girls. She is taking counseling classes now undergraduate. Shares that her all or nothing feeds into compulsive behavior. She doesn't like uncertainty. She wants to hide from mental illness, and then questions herself more. She  doesn't want to be like dad. Medications really help with symptoms. Compliance is important. She is proud of herself because of non using opiates. Describes getting  support from mom and sister. Does not think she learned  emotional regulation growing up. She still is  upset about leaving about last job. Reviewed session and she identified that she has to learn not to beat upset, recognize therapy as helpful and challenge perceptions. Reviewed treatment goals and will continue to work on setting goals of stabilizing mood, processed through  trauma and self-esteem and identified consistency and not isolating as important    Suicidal/Homicidal: No  Therapist Response: Therapist reviewed symptoms and updated information for patient as she has not been here since last July. Reviewed reason she did not continue with therapy as not liking to admit issues with mental health and identified that consistency will be important in making progress with treatment. Identified that patient went through a difficult period but also identified progress she has been able to make including recognizing distortions in thought, not using opiates since she has seen therapist, and not giving into compulsive behaviors. Help patient to reframe to identify therapy is helpful and to encourage coping strategies to implement that will be effective. Reviewed treatment goals and identify that patient needs to continue to work on goals and important for her not to isolate. Provided strength based and supportive interventions.  Plan: Return again in 2 weeks.2. Patient set up appointment for psych evaluation at this office 3. Patient continue to work on challenging distortions and perspective gait and insight to effective coping strategies to manage emotions and stressors  Diagnosis: Axis I: major depressive disorder, recurrent, moderate, opiate use disorder, moderate, dependence, generalized anxiety disorder    Axis II: No diagnosis    Bowman,Mary A, LCSW 05/14/2016

## 2016-05-28 ENCOUNTER — Ambulatory Visit (HOSPITAL_COMMUNITY): Payer: Self-pay | Admitting: Licensed Clinical Social Worker

## 2016-06-01 ENCOUNTER — Ambulatory Visit (HOSPITAL_COMMUNITY): Payer: Self-pay | Admitting: Psychiatry

## 2016-06-11 ENCOUNTER — Ambulatory Visit (HOSPITAL_COMMUNITY): Payer: Self-pay | Admitting: Licensed Clinical Social Worker

## 2016-06-20 ENCOUNTER — Ambulatory Visit (HOSPITAL_COMMUNITY): Payer: Self-pay | Admitting: Psychiatry

## 2016-08-26 ENCOUNTER — Other Ambulatory Visit: Payer: Self-pay | Admitting: Sports Medicine

## 2016-09-13 DIAGNOSIS — R45851 Suicidal ideations: Secondary | ICD-10-CM | POA: Diagnosis not present

## 2016-09-13 DIAGNOSIS — F23 Brief psychotic disorder: Secondary | ICD-10-CM | POA: Diagnosis not present

## 2016-09-27 ENCOUNTER — Ambulatory Visit (INDEPENDENT_AMBULATORY_CARE_PROVIDER_SITE_OTHER): Payer: Managed Care, Other (non HMO) | Admitting: Sports Medicine

## 2016-09-27 ENCOUNTER — Encounter: Payer: Self-pay | Admitting: Sports Medicine

## 2016-09-27 DIAGNOSIS — F329 Major depressive disorder, single episode, unspecified: Secondary | ICD-10-CM | POA: Diagnosis not present

## 2016-09-27 DIAGNOSIS — F419 Anxiety disorder, unspecified: Secondary | ICD-10-CM

## 2016-09-27 DIAGNOSIS — F32A Depression, unspecified: Secondary | ICD-10-CM

## 2016-09-27 MED ORDER — TRAZODONE HCL 150 MG PO TABS: 150.0000 mg | ORAL_TABLET | Freq: Every day | ORAL | 3 refills | Status: DC

## 2016-09-27 MED ORDER — HYDROXYZINE HCL 50 MG PO TABS: 50.0000 mg | ORAL_TABLET | Freq: Every day | ORAL | 3 refills | Status: DC

## 2016-09-27 MED ORDER — VENLAFAXINE HCL ER 75 MG PO CP24: 75.0000 mg | ORAL_CAPSULE | Freq: Every day | ORAL | 3 refills | Status: DC

## 2016-09-27 NOTE — Progress Notes (Signed)
  Subjective:    CC: Hospital discharge  HPI: Regina [name] was recently in the hospital for a suicide attempt, she ingested a number of Tylenol and ibuprofen. Her levels were high but she never had renal or hepatic insufficiency. She was stabilized on Effexor 37.5, trazodone 150 in the hospital and discharged. Overall she feels well, her mood symptoms have improved considerably and she has no suicidal or homicidal ideation.  Past medical history:  Negative.  See flowsheet/record as well for more information.  Surgical history: Negative.  See flowsheet/record as well for more information.  Family history: Negative.  See flowsheet/record as well for more information.  Social history: Negative.  See flowsheet/record as well for more information.  Allergies, and medications have been entered into the medical record, reviewed, and no changes needed.   Review of Systems: No fevers, chills, night sweats, weight loss, chest pain, or shortness of breath.   Objective:    General: Well Developed, well nourished, and in no acute distress.  Neuro: Alert and oriented x3, extra-ocular muscles intact, sensation grossly intact.  HEENT: Normocephalic, atraumatic, pupils equal round reactive to light, neck supple, no masses, no lymphadenopathy, thyroid nonpalpable.  Skin: Warm and dry, no rashes. Cardiac: Regular rate and rhythm, no murmurs rubs or gallops, no lower extremity edema.  Respiratory: Clear to auscultation bilaterally. Not using accessory muscles, speaking in full sentences.  Impression and Recommendations:    Anxiety and depression Patient had another suicide attempt, this time with Tylenol, ibuprofen. 40 ibuprofen PM softgels and 24 acetaminophen PM tabs ingested and then refused charcoal at the emergency department. She did have a brief psychiatric hospitalization where she was stabilized on low-dose Effexor, trazodone 150. She has been discharged from our psychiatrist here in [place], so I'm  going to try and get her in to someone in [place]. I'm going to increase her Effexor to 75 mg, refilling trazodone. I also need to check Tylenol, salicylate/ibuprofen levels, kidney function and liver function. Return to see me in one month to repeat PHQ9 and GAD7, we will then allow psychiatry to take the torch when she establishes. Abilify was discontinued in the hospitalization. No current suicidal or homicidal ideation, contracts for safety.  I spent 25 minutes with this patient, greater than 50% was face-to-face time counseling regarding the above diagnoses

## 2016-09-27 NOTE — Assessment & Plan Note (Addendum)
Patient had another suicide attempt, this time with Tylenol, ibuprofen. 40 ibuprofen PM softgels and 24 acetaminophen PM tabs ingested and then refused charcoal at the emergency department. She did have a brief psychiatric hospitalization where she was stabilized on low-dose Effexor, trazodone 150. She has been discharged from our psychiatrist here in [place], so I'm going to try and get her in to someone in [place]. I'm going to increase her Effexor to 75 mg, refilling trazodone. I also need to check Tylenol, salicylate/ibuprofen levels, kidney function and liver function. Return to see me in one month to repeat PHQ9 and GAD7, we will then allow psychiatry to take the torch when she establishes. Abilify was discontinued in the hospitalization. No current suicidal or homicidal ideation, contracts for safety.

## 2016-09-28 ENCOUNTER — Other Ambulatory Visit: Payer: Self-pay | Admitting: Sports Medicine

## 2016-09-28 LAB — COMPREHENSIVE METABOLIC PANEL
AST: 16 U/L (ref 10–30)
Alkaline Phosphatase: 85 U/L (ref 33–115)
CO2: 22 mmol/L (ref 20–31)
Creat: 0.69 mg/dL (ref 0.50–1.10)
Glucose, Bld: 84 mg/dL (ref 65–99)
Sodium: 137 mmol/L (ref 135–146)
Total Bilirubin: 0.3 mg/dL (ref 0.2–1.2)

## 2016-09-28 LAB — COMPREHENSIVE METABOLIC PANEL WITH GFR
ALT: 21 U/L (ref 6–29)
Albumin: 3.7 g/dL (ref 3.6–5.1)
BUN: 10 mg/dL (ref 7–25)
Calcium: 9 mg/dL (ref 8.6–10.2)
Chloride: 104 mmol/L (ref 98–110)
Potassium: 4.1 mmol/L (ref 3.5–5.3)
Total Protein: 6.5 g/dL (ref 6.1–8.1)

## 2016-09-28 LAB — CBC
HCT: 36.9 % (ref 35.0–45.0)
Hemoglobin: 12.1 g/dL (ref 11.7–15.5)
MCH: 26.3 pg — ABNORMAL LOW (ref 27.0–33.0)
MCHC: 32.8 g/dL (ref 32.0–36.0)
MCV: 80.2 fL (ref 80.0–100.0)
MPV: 8.2 fL (ref 7.5–12.5)
Platelets: 353 K/uL (ref 140–400)
RBC: 4.6 MIL/uL (ref 3.80–5.10)
RDW: 14.4 % (ref 11.0–15.0)
WBC: 8.5 10*3/uL (ref 3.8–10.8)

## 2016-09-30 LAB — ACETAMINOPHEN LEVEL: Acetaminophen (Tylenol), Serum: <10 mg/L — ABNORMAL LOW (ref 10–20)

## 2016-10-01 MED ORDER — RIZATRIPTAN BENZOATE 5 MG PO TBDP: 5.0000 mg | ORAL_TABLET | ORAL | 0 refills | Status: DC | PRN

## 2016-10-02 LAB — SALICYLATE LEVEL: Salicylate Lvl: <50 mg/L — ABNORMAL LOW

## 2016-10-04 ENCOUNTER — Ambulatory Visit (INDEPENDENT_AMBULATORY_CARE_PROVIDER_SITE_OTHER): Payer: Managed Care, Other (non HMO) | Admitting: Licensed Clinical Social Worker

## 2016-10-04 DIAGNOSIS — F411 Generalized anxiety disorder: Secondary | ICD-10-CM | POA: Diagnosis not present

## 2016-10-04 DIAGNOSIS — F331 Major depressive disorder, recurrent, moderate: Secondary | ICD-10-CM | POA: Diagnosis not present

## 2016-10-04 NOTE — Progress Notes (Signed)
THERAPIST PROGRESS NOTE  Session Time: 11 AM to 11:52 AM  Participation Level: Active  Behavioral Response: CasualAlertDysphoric and Tearful at times and euthymic at time  Type of Therapy: Individual Therapy  Treatment Goals addressed:  patient work on emotional regulation, self-esteem and trauma related issues  Interventions: CBT, Solution Focused, Strength-based, Supportive and Reframing  Summary: Regina [name] is a 41 y.o. female who presents with having a bad period fir  3-4 month of staying in bed, isolated herself from family from  February to May if this year. Related that three weeks ago, she felt depressed and hopeless, bought and took whole bottle of Ibuprophen and Tylenol PM(40/24 pills), called sister and she called the paramedics. Patient was tearful in describing what happened.She was assessed and  involuntary committed to [facility]. She was there one week from 09/13/16 to May 24 and this is her follow up appointment. Plans lack of consistency with appointments before related to she's doesn't come when she doesn't need it and then when she needs to come there is not an appointment. Recognizes that consistency and coming once a week is an important part of her treatment to help her stabilize and make progress. The psychiatrists change medications and she is doing well on Effexor. She does not think the medication before of Celexa was helpful and she is also on trazodone.she got up every day at the hospital and has been getting up every day to take her kids to school and she knows that it is important to continue this positive habit for her mental health. Before she wouldn't get up and she recognizes these negative behaviors contributed to her detail relation along with isolating. She also has things planned to do with her kids for the summer. Discussed how spending time together helps and developing there were relationship and is good for them and for her. Concerned with children who  are 12, 10, 15, "they have been damaged" and feels they could use therapy.   Has a job interview interview today and shared that she has thoughts but "better at shutting it down", explored this with patient and she said it relates to anxiety discussed releasing emotions and healthy attitudes to help with coping with emotions. Describes she is doing better, denies SI and rates her mood as 6/7 out of 10 with 10 being the best and anxiety as being 5/10 with 10 being the best. 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 Psych history-recent hospitalization is second hospitalization, DR T was first mental health treatment when he prescribed meds in 2014 and this is patient's first therapeutic experience. She denies medical issues. She identifies working on self-esteem, also identifies trauma issues as dad  committed suicide 2002, she was 36 pregnant with first child. He struggled with mental health issues all his life and shared that mom has PTSD from this experience. She relates she was relatively close to her father. Identified ways in session and patient is able to reframe her thinking and how this is helpful and challenging negative and thought distortions. Patient reports not abusing or using opiates since she started with therapist last year in July.  Suicidal/Homicidal: No  Therapist Response: Therapist updated assessment quitting gathering information about significant events and changes in mood and functioning since last seen in January. Patient denied suicidality and committed to safety plan while in treatment and will bring in safety plan developed by hospital  next week. Work with patient on distortions and perception including negative view she has of mental health issues, working with patient on anxiety through challenging thing she has control of to acquire a  willingness to  take life as it comes and rather than feeling and struggling with those occasions where circumstances don't obey your expectations, you can learn to go with the change. Explored circumstances that led to the suicide attempt and identified structure and schedule that will help patient from spiraling downward. Discussed how attitude and behaviors help manage emotions. Related that patient has to challenge thoughts as they are not all telling her the truth and worked on helping her reframe perspective to help with coping. Discussed therapeutic value in session for patient to release emotions as healthy way to work through them and not shutting them down. Provided supportive and strength-based interventions. Identify trauma and self-esteem issues the patient will need to work on as well in therapy Plan: 1.patient to be put regularly on therapist schedule once a week after first available appointment in 3 weeks. Patient to implement structure and schedule into her day by waking up every morning, had joined time with her kids throughout the summer and interviewing for a job.2. Therapist will work with patient on emotional regulation strategies to help her in managing depression and anxiety, trauma issues and self-esteem issues.   Diagnosis: Axis I:  major depressive disorder, recurrent, moderate, generalized anxiety disorder    Axis II: No diagnosis    Cordella Register, LCSW 10/04/2016

## 2016-10-10 ENCOUNTER — Encounter: Payer: Self-pay | Admitting: Sports Medicine

## 2016-10-22 ENCOUNTER — Ambulatory Visit (INDEPENDENT_AMBULATORY_CARE_PROVIDER_SITE_OTHER): Payer: Managed Care, Other (non HMO) | Admitting: Licensed Clinical Social Worker

## 2016-10-22 DIAGNOSIS — F411 Generalized anxiety disorder: Secondary | ICD-10-CM | POA: Diagnosis not present

## 2016-10-22 DIAGNOSIS — F331 Major depressive disorder, recurrent, moderate: Secondary | ICD-10-CM

## 2016-10-22 NOTE — Progress Notes (Signed)
   THERAPIST PROGRESS NOTE  Session Time: 2:05 PM to 3 PM  Participation Level: Active  Behavioral Response: CasualAlertPatient presented with lability of mood from euthymic to dysphoric and depressed  Type of Therapy: Individual Therapy  Treatment Goals addressed:  patient work on emotional regulation, self-esteem and trauma related issues  Interventions: CBT, Solution Focused, Strength-based, Supportive and Reframing  Summary: Regina [name] is a 41 y.o. female who presents with stress of having job interviews and recognizing that she can get into negative thinking and negative forecasting. She realizes the need to work balance and challenging negative thinking. Related recent stressor of coping with daughter who recently revealed she is transgender. Patient realizes the need to be supportive and not invalidating but also describes the need to work through a loss. She describes guilt related to still not getting pronouns and name right. It has not impacted her mood to a significant degree.her daughter has seen Dr. and will be referred for individual therapy. Her mood has been impacted related to anxiety about the job. Recognizes cognitive distortions including negative forecasting, perfectionistic thinking, black and white thinking. Describes cycle of perfectionistic and if she doesn't do it to her expectations she gives up, has migraines related to stress and that migraines impact her functioning which lead to increase in stress and more problems with mood and functioning. Worked with patient on rationally refuting distortions in thinking and working on personality traits that will help her with depression and anxiety including acceptance and patience. Described managing upcoming anniversary of father's suicide and how to cope. She realizes she is not responsible and thinks being around sister will be helpful.   Suicidal/Homicidal: No  Therapist Response: Reviewed progress and symptoms and help  patient to process feelings around current stressors and helped to engage in problem solving to help with stress management. Identifed cognitive distortions that cause problems with depression, anxiety, self-esteem. Worked with patient in session on rationally refuting these thoughts. Assigned her pay attention to instances when they occur during the week, to examine whether she is telling herself at times when she feels particularly anxious or stressed, to work on rationally refuting these thoughts and developing counter statements. Worked with patient on specific traits that underlying depression and anxiety including perfectionism, excessive need to control as well as on strategies that are helpful in changing these styles of thinking including acceptance and patience. Provided strength based and supportive interventions.   Plan: Return again in 1 weeks.2. Patient work on identifying and rationally refuting cognitive distortions in order to find more accurate perceptions developing counter statements.3. Therapist continue to work with patient on coping strategies to manage stressors in mood  Diagnosis: Axis I:  major depressive disorder, recurrent, moderate, generalized anxiety disorder    Axis II: No diagnosis    Cordella Register, LCSW 10/22/2016

## 2016-10-25 ENCOUNTER — Ambulatory Visit (INDEPENDENT_AMBULATORY_CARE_PROVIDER_SITE_OTHER): Payer: Managed Care, Other (non HMO) | Admitting: Sports Medicine

## 2016-10-25 DIAGNOSIS — F32A Depression, unspecified: Secondary | ICD-10-CM

## 2016-10-25 DIAGNOSIS — F419 Anxiety disorder, unspecified: Secondary | ICD-10-CM | POA: Diagnosis not present

## 2016-10-25 DIAGNOSIS — F329 Major depressive disorder, single episode, unspecified: Secondary | ICD-10-CM

## 2016-10-25 DIAGNOSIS — Z0000 Encounter for general adult medical examination without abnormal findings: Secondary | ICD-10-CM | POA: Diagnosis not present

## 2016-10-25 MED ORDER — VENLAFAXINE HCL ER 150 MG PO CP24: 150.0000 mg | ORAL_CAPSULE | Freq: Every day | ORAL | 3 refills | Status: DC

## 2016-10-25 NOTE — Assessment & Plan Note (Signed)
Fantastic improvements with the increase to 75 mg of Effexor, increasing to 150 mg, no other changes.

## 2016-10-25 NOTE — Progress Notes (Signed)
  Subjective:    CC: Follow-up  HPI: Anxiety and depression: Recent suicide attempt, overall feeling significantly better with the increase to 75 mg of Effexor, agreeable to go up on the dose, still has some anxiety and depressive symptoms, no suicidal or homicidal ideation.  Preventive measures: Due for cervical cancer screening, routine blood work.  Past medical history:  Negative.  See flowsheet/record as well for more information.  Surgical history: Negative.  See flowsheet/record as well for more information.  Family history: Negative.  See flowsheet/record as well for more information.  Social history: Negative.  See flowsheet/record as well for more information.  Allergies, and medications have been entered into the medical record, reviewed, and no changes needed.   Review of Systems: No fevers, chills, night sweats, weight loss, chest pain, or shortness of breath.   Objective:    General: Well Developed, well nourished, and in no acute distress.  Neuro: Alert and oriented x3, extra-ocular muscles intact, sensation grossly intact.  HEENT: Normocephalic, atraumatic, pupils equal round reactive to light, neck supple, no masses, no lymphadenopathy, thyroid nonpalpable.  Skin: Warm and dry, no rashes. Cardiac: Regular rate and rhythm, no murmurs rubs or gallops, no lower extremity edema.  Respiratory: Clear to auscultation bilaterally. Not using accessory muscles, speaking in full sentences.  Impression and Recommendations:    Anxiety and depression Fantastic improvements with the increase to 75 mg of Effexor, increasing to 150 mg, no other changes.  Annual physical exam Due for routine blood work and cervical cancer screening, referral to OB/GYN.  I spent 25 minutes with this patient, greater than 50% was face-to-face time counseling regarding the above diagnoses

## 2016-10-25 NOTE — Assessment & Plan Note (Signed)
Due for routine blood work and cervical cancer screening, referral to OB/GYN.

## 2016-10-29 ENCOUNTER — Ambulatory Visit (INDEPENDENT_AMBULATORY_CARE_PROVIDER_SITE_OTHER): Payer: Managed Care, Other (non HMO) | Admitting: Licensed Clinical Social Worker

## 2016-10-29 DIAGNOSIS — F331 Major depressive disorder, recurrent, moderate: Secondary | ICD-10-CM | POA: Diagnosis not present

## 2016-10-29 DIAGNOSIS — F411 Generalized anxiety disorder: Secondary | ICD-10-CM | POA: Diagnosis not present

## 2016-10-29 NOTE — Progress Notes (Signed)
   THERAPIST PROGRESS NOTE  Session Time: 2 PM to 2:52 PM  Participation Level: Active  Behavioral Response: CasualAlertEuthymic  Type of Therapy: Individual Therapy  Treatment Goals addressed:  patient will stabilize mood, work through past trauma issues, learn and implement the skills needed to identify challenge and change unhelpful thought patterns as well as learn strategies to work on self-esteem  Interventions: CBT, DBT, Solution Focused, Strength-based, Supportive and Other: Skills to work on self-esteem  Summary: Regina [name] is a 41 y.o. female who presents with doing good today. Job situation but has accepted that it may not go the way she wants, there is a plan even thought she doesn't know what it is and has to have patience. Doctor doubled anti-depressant, Effexor and can feel it is helping. Describes having a "meltdown" on Wednesday but working through it and has insight that she can't control the past. Shared her growing insight that she self-worth is not based on job. Joined two support groups for mental health and a  reminder to be grateful for things. Shared a phrase she developed to help her reinforce positive self talk. Relates that she , needs to keep a schedule and realizes that it is important to her mental health. Introduced mindfulness and introduced app for her to follow. Discussed benefits for relaxation and emotional regulation. Reviewed session and she will continue working on changing the self-talk. When came before she didn't want to face things and describes she has to do the same thing with mental health that her husband does with diabetes. There are some things she has to do in order to take care of it. Completed treatment plan goals.  Suicidal/Homicidal: No  Therapist Response: Reviewed symptoms and provided positive feedback for patient on challenging cognitive distortions including fortune telling, actively working on self talk to help with coping and working  on traits such as control and patience to help with coping.  Worked on self-esteem and explained that it is unconditional and that taking care of oneself as the foundation. Identified keeping an schedule as important for coping and keeping busy. Introduced mindfulness as a Social worker for emotional regulation and explained it is based on present awareness, and through this practice one can get distance from thoughts, insight to patterns and thoughts that will help one managed more effectively. Completed treatment plan. Provided strength based and supportive interventions. Plan: Return again in 2 weeks.2. Patient work on identifying and rationally refuting cognitive distortions in order to find more accurate perceptions developing counter statements.3. Therapist work with patient on emotional regulation strategies  Diagnosis: Axis I:  major depressive disorder, recurrent, moderate, generalized anxiety disorder    Axis II: No diagnosis    Cordella Register, LCSW 10/29/2016

## 2016-11-06 ENCOUNTER — Other Ambulatory Visit: Payer: Self-pay | Admitting: Sports Medicine

## 2016-11-12 ENCOUNTER — Ambulatory Visit (HOSPITAL_COMMUNITY): Payer: Self-pay | Admitting: Licensed Clinical Social Worker

## 2016-11-14 ENCOUNTER — Other Ambulatory Visit: Payer: Self-pay

## 2016-11-14 DIAGNOSIS — F329 Major depressive disorder, single episode, unspecified: Secondary | ICD-10-CM

## 2016-11-14 DIAGNOSIS — F419 Anxiety disorder, unspecified: Principal | ICD-10-CM

## 2016-11-14 MED ORDER — TRAZODONE HCL 150 MG PO TABS: 150.0000 mg | ORAL_TABLET | Freq: Every day | ORAL | 0 refills | Status: DC

## 2016-11-22 ENCOUNTER — Ambulatory Visit: Payer: Self-pay | Admitting: Sports Medicine

## 2016-11-26 ENCOUNTER — Ambulatory Visit (HOSPITAL_COMMUNITY): Payer: Managed Care, Other (non HMO) | Admitting: Licensed Clinical Social Worker

## 2016-12-20 ENCOUNTER — Encounter: Payer: Self-pay | Admitting: Sports Medicine

## 2016-12-25 ENCOUNTER — Encounter: Payer: Self-pay | Admitting: Sports Medicine

## 2017-01-02 IMAGING — MR MR LUMBAR SPINE W/O CM
4 of 5 series · 25 of 48 positions shown · non-contrast
Comparison: Lumbar spine radiographs 07/22/2015

CLINICAL DATA: Low back and left leg pain and numbness for 3
months. No known injury.

EXAM:
MRI LUMBAR SPINE WITHOUT CONTRAST
TECHNIQUE: Multiplanar, multisequence MR imaging of the lumbar spine was
performed. No intravenous contrast was administered.

[Series 2: T2 · sagittal · 4.0mm · 0.84mm/px · 6 of 15 slices shown (1 of 2)]
[im 1/15]
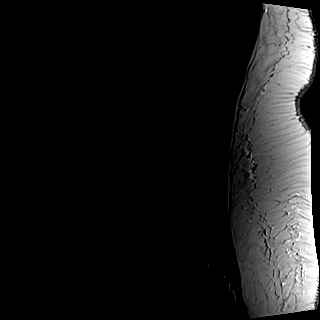
[im 3/15]
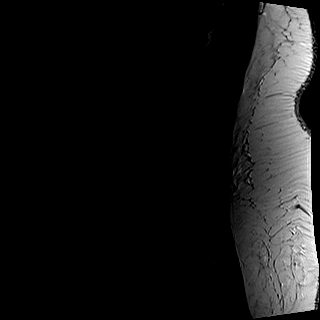
[im 6/15]
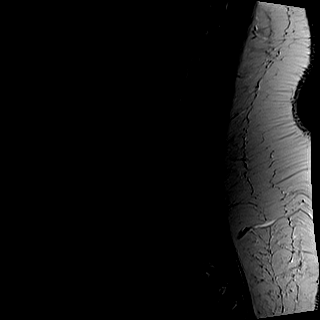
[im 9/15]
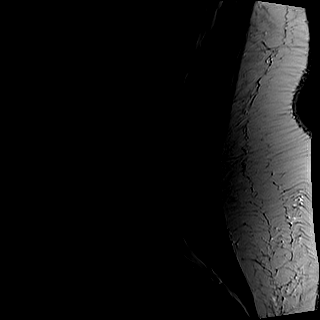
[im 12/15]
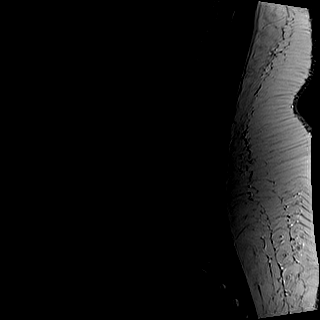
[im 15/15]
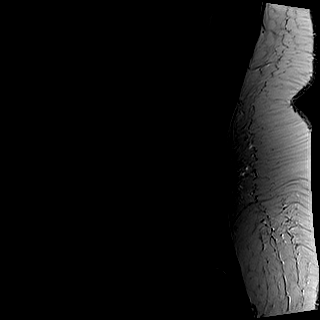

[Series 3: T1 · sagittal · 4.0mm · 0.42mm/px · 6 of 15 slices shown (1 of 2)]
[im 1/15]
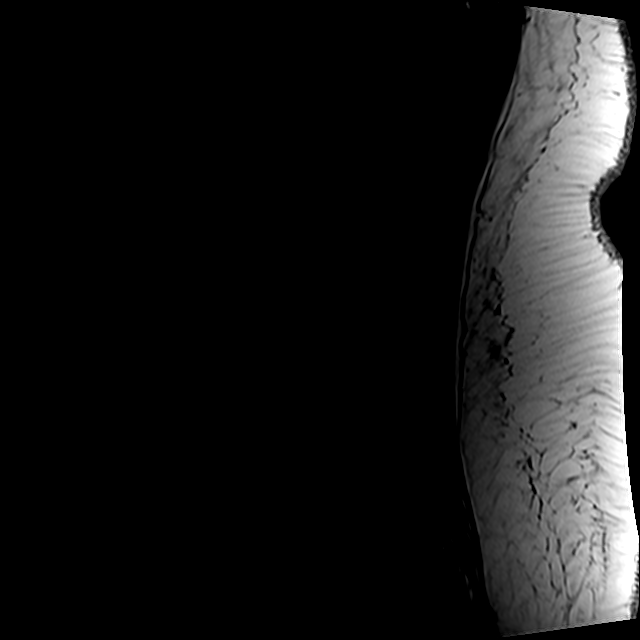
[im 3/15]
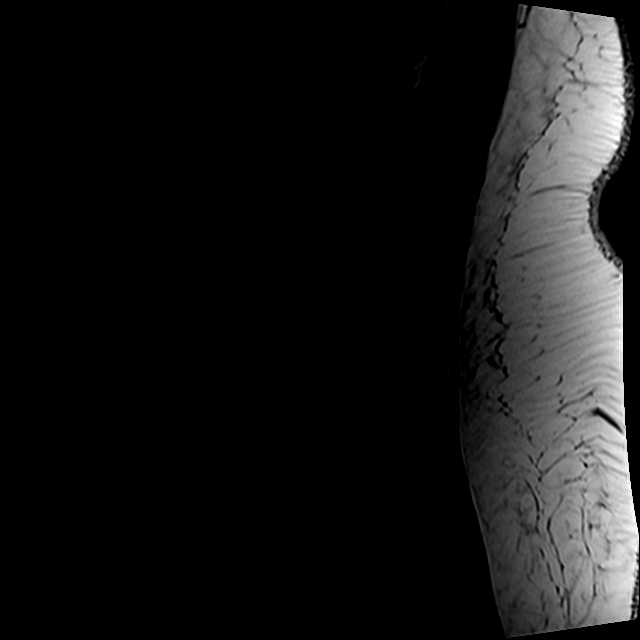
[im 6/15]
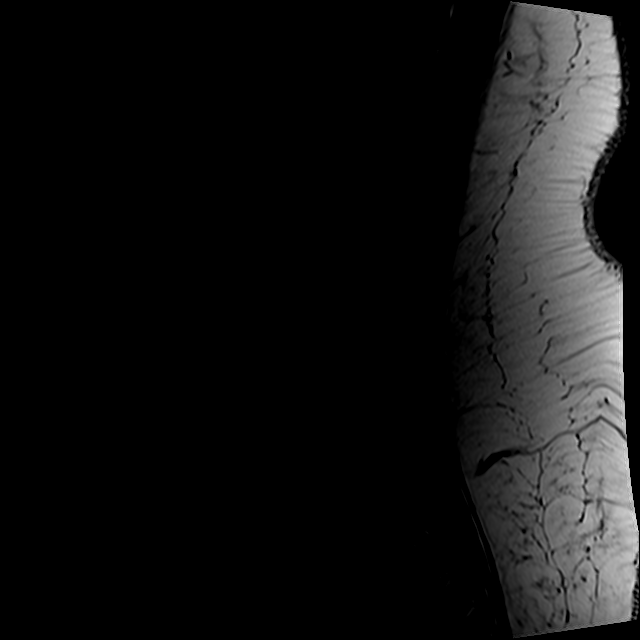
[im 9/15]
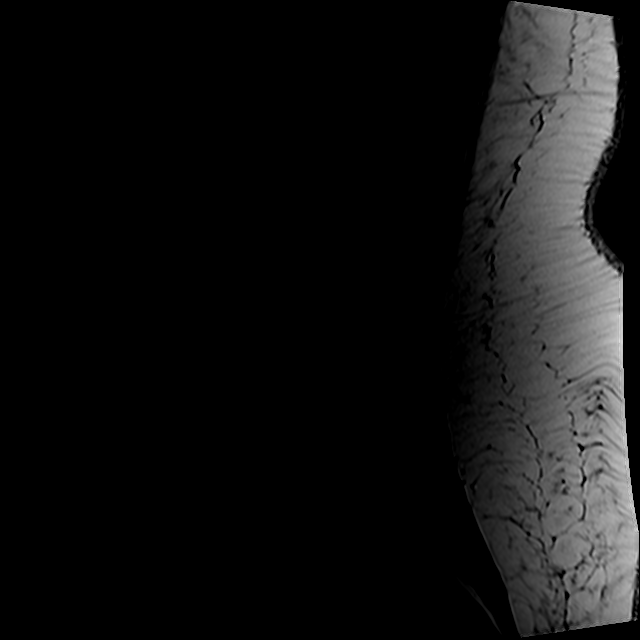
[im 12/15]
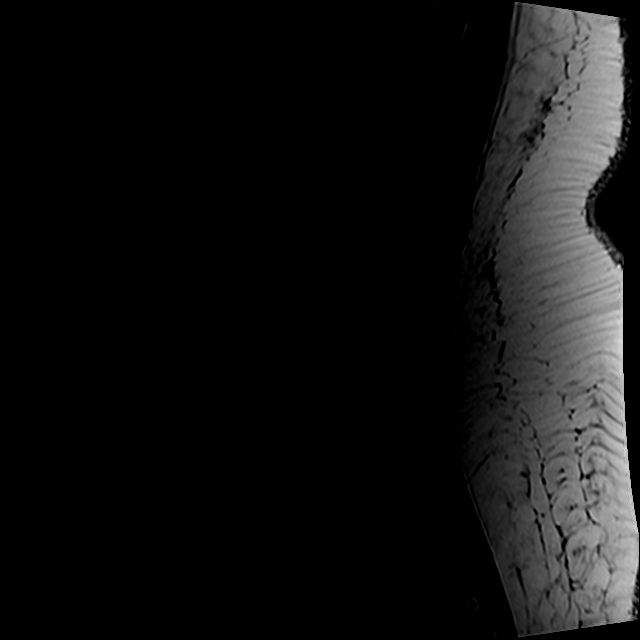
[im 15/15]
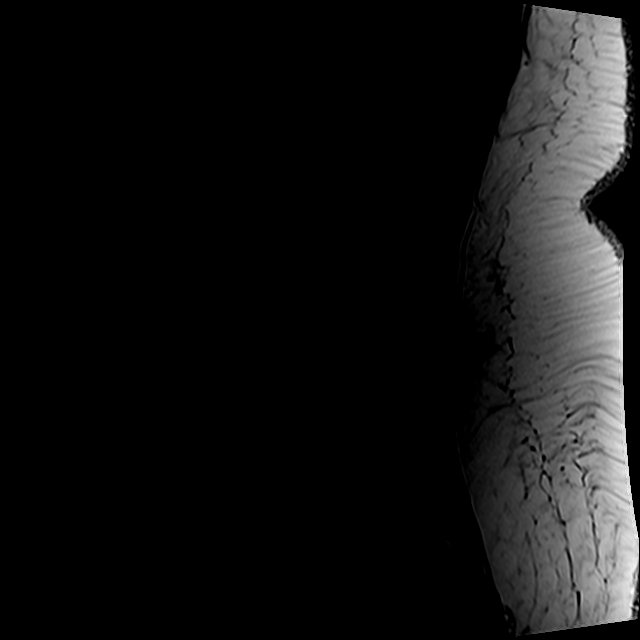

[Series 5: T2 · axial · 4.0mm · 0.78mm/px · z∈[-98,+126]mm · 9 of 39 slices shown (2 of 2)]
[im 1/39]
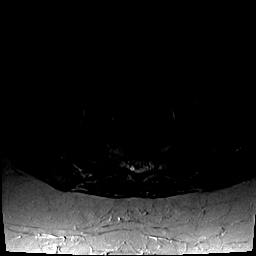
[im 6/39]
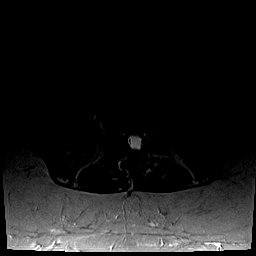
[im 11/39]
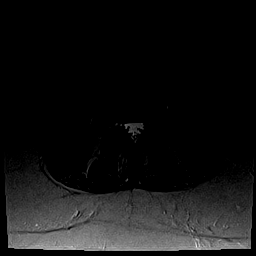
[im 17/39]
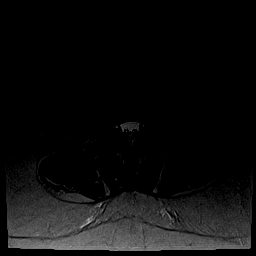
[im 20/39]
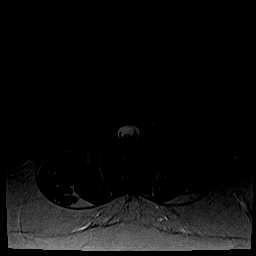
[im 22/39]
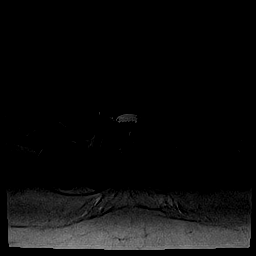
[im 28/39]
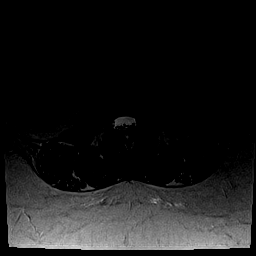
[im 33/39]
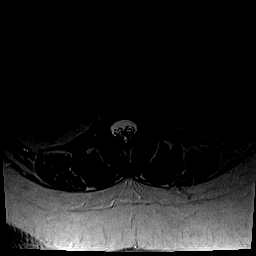
[im 39/39]
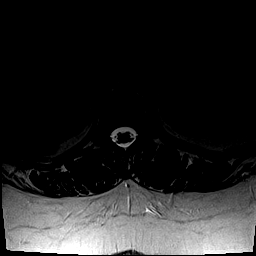

[Series 6: T1 · axial · 4.0mm · 0.31mm/px · z∈[-98,+97]mm · 4 of 39 slices shown (2 of 2)]
[im 1/39]
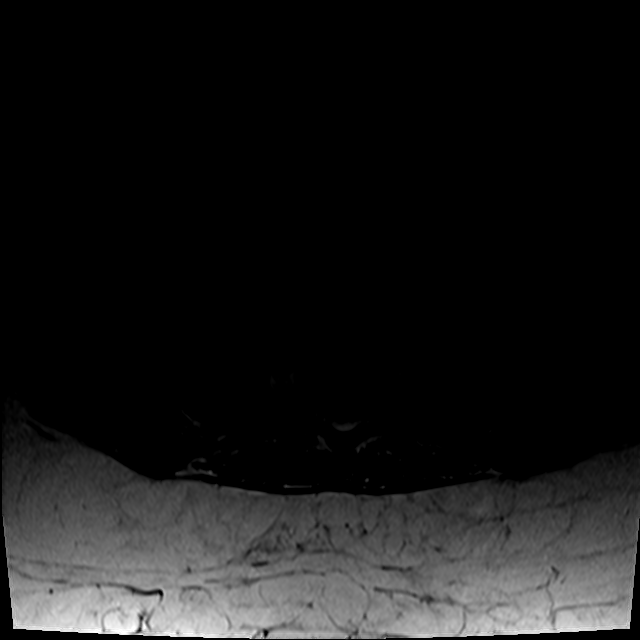
[im 6/39]
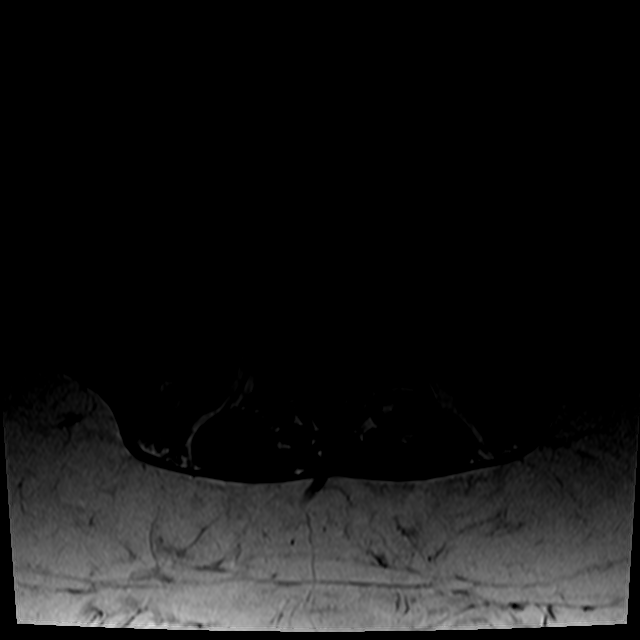
[im 20/39]
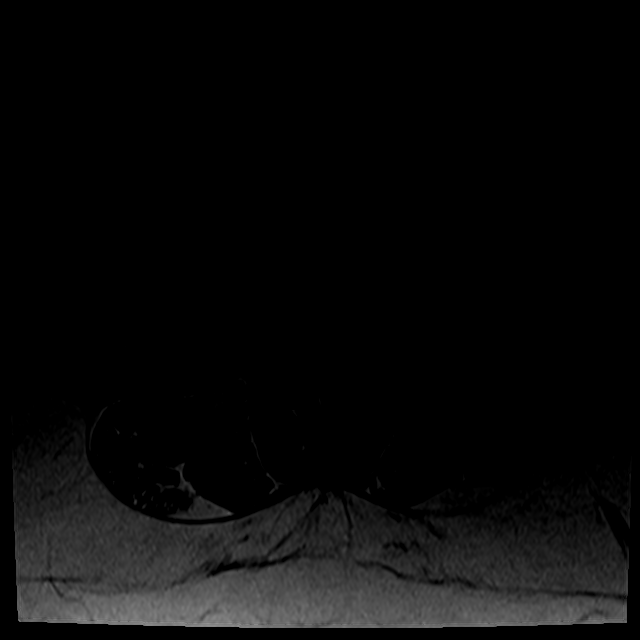
[im 33/39]
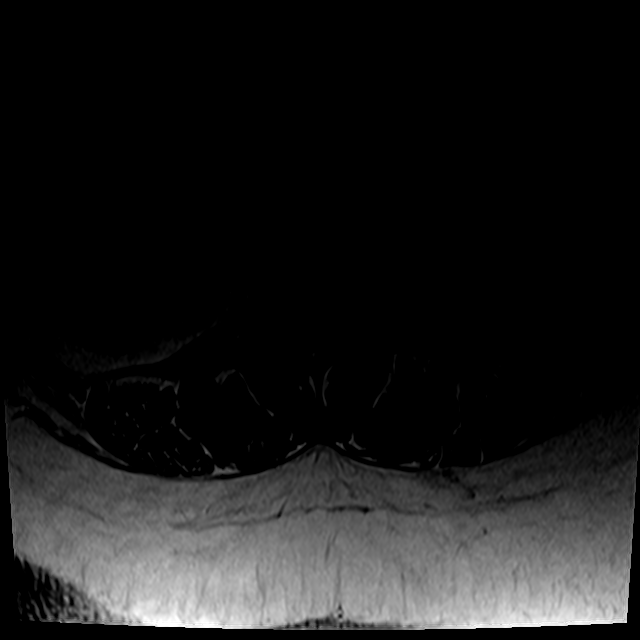

[25 of 48 positions shown; findings below may reference images not displayed]

FINDINGS: Segmentation: 5 lumbar type vertebral bodies. The last full
intervertebral disc space is labeled L5-S1. This correlates with the
plain films.

Alignment:  Normal.

Vertebrae:  Normal except for a few small scattered hemangiomas.

Conus medullaris: Extends to the L1 level and appears normal.

Paraspinal and other soft tissues: No significant findings

Disc levels:

No disc protrusions, spinal or foraminal stenosis. Mild facet
degenerative changes noted in the lower lumbar spine.
IMPRESSION: Unremarkable lumbar spine MRI examination.

## 2017-01-10 ENCOUNTER — Encounter: Payer: Self-pay | Admitting: Sports Medicine

## 2017-01-10 ENCOUNTER — Ambulatory Visit (INDEPENDENT_AMBULATORY_CARE_PROVIDER_SITE_OTHER): Payer: Managed Care, Other (non HMO) | Admitting: Sports Medicine

## 2017-01-10 DIAGNOSIS — Z0000 Encounter for general adult medical examination without abnormal findings: Secondary | ICD-10-CM

## 2017-01-10 NOTE — Progress Notes (Signed)
  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 Needs some paperwork filled out. Tells me her mood is better, back pain is better. No suicidal or homicidal ideation.  Past medical history:  Negative.  See flowsheet/record as well for more information.  Surgical history: Negative.  See flowsheet/record as well for more information.  Family history: Negative.  See flowsheet/record as well for more information.  Social history: Negative.  See flowsheet/record as well for more information.  Allergies, and medications have been entered into the medical record, reviewed, and no changes needed.   Review of Systems: No fevers, chills, night sweats, weight loss, chest pain, or shortness of breath.   Objective:    General: Well Developed, well nourished, and in no acute distress.  Neuro: Alert and oriented x3, extra-ocular muscles intact, sensation grossly intact.  HEENT: Normocephalic, atraumatic, pupils equal round reactive to light, neck supple, no masses, no lymphadenopathy, thyroid nonpalpable.  Skin: Warm and dry, no rashes. Cardiac: Regular rate and rhythm, no murmurs rubs or gallops, no lower extremity edema.  Respiratory: Clear to auscultation bilaterally. Not using accessory muscles, speaking in full sentences.  Impression and Recommendations:    Annual physical exam Paperwork filled out for employment at the [employer]. Adding tuberculosis Quantiferon Gold testing, hepatitis B antibodies, MMR antibodies. Up-to-date on tetanus.  ___________________________________________ Gwen Her. Dianah Field, M.D., ABFM., CAQSM. Primary Care and [place] Instructor of [place] [facility]

## 2017-01-10 NOTE — Assessment & Plan Note (Signed)
Paperwork filled out for employment at [employer]. Adding tuberculosis Quantiferon Gold testing, hepatitis B antibodies, MMR antibodies. Up-to-date on tetanus.

## 2017-01-11 ENCOUNTER — Other Ambulatory Visit: Payer: Self-pay | Admitting: Sports Medicine

## 2017-01-11 LAB — COMPLETE METABOLIC PANEL WITHOUT GFR
AG Ratio: 1.4 (calc) (ref 1.0–2.5)
ALT: 16 U/L (ref 6–29)
Alkaline phosphatase (APISO): 101 U/L (ref 33–115)
BUN: 12 mg/dL (ref 7–25)
Chloride: 104 mmol/L (ref 98–110)
Potassium: 4 mmol/L (ref 3.5–5.3)
Total Bilirubin: 0.2 mg/dL (ref 0.2–1.2)

## 2017-01-11 LAB — CBC
HCT: 36.4 % (ref 35.0–45.0)
Hemoglobin: 12 g/dL (ref 11.7–15.5)
MCH: 25.3 pg — ABNORMAL LOW (ref 27.0–33.0)
MCHC: 33 g/dL (ref 32.0–36.0)
MCV: 76.6 fL — ABNORMAL LOW (ref 80.0–100.0)
MPV: 8.8 fL (ref 7.5–12.5)
Platelets: 400 10*3/uL (ref 140–400)
RBC: 4.75 10*6/uL (ref 3.80–5.10)
RDW: 13.8 % (ref 11.0–15.0)
WBC: 11 10*3/uL — ABNORMAL HIGH (ref 3.8–10.8)

## 2017-01-11 LAB — HEMOGLOBIN A1C
Hgb A1c MFr Bld: 5 % of total Hgb (ref <5.7)
Mean Plasma Glucose: 97 (calc)
eAG (mmol/L): 5.4 (calc)

## 2017-01-11 LAB — VITAMIN D 25 HYDROXY (VIT D DEFICIENCY, FRACTURES): Vit D, 25-Hydroxy: 23 ng/mL — ABNORMAL LOW (ref 30–100)

## 2017-01-11 LAB — COMPLETE METABOLIC PANEL WITH GFR
AST: 14 U/L (ref 10–30)
Albumin: 3.9 g/dL (ref 3.6–5.1)
CO2: 26 mmol/L (ref 20–32)
Calcium: 8.7 mg/dL (ref 8.6–10.2)
Creat: 0.74 mg/dL (ref 0.50–1.10)
GFR, Est African American: 117 mL/min/{1.73_m2} (ref >=60)
GFR, Est Non African American: 101 mL/min/{1.73_m2} (ref >=60)
Globulin: 2.7 g/dL (calc) (ref 1.9–3.7)
Glucose, Bld: 82 mg/dL (ref 65–99)
Sodium: 140 mmol/L (ref 135–146)
Total Protein: 6.6 g/dL (ref 6.1–8.1)

## 2017-01-11 LAB — LIPID PANEL W/REFLEX DIRECT LDL
Cholesterol: 196 mg/dL (ref <200)
HDL: 39 mg/dL — ABNORMAL LOW (ref >50)
LDL Cholesterol (Calc): 128 mg/dL (calc) — ABNORMAL HIGH
Non-HDL Cholesterol (Calc): 157 mg/dL (calc) — ABNORMAL HIGH (ref <130)
Total CHOL/HDL Ratio: 5 (calc) — ABNORMAL HIGH (ref <5.0)
Triglycerides: 175 mg/dL — ABNORMAL HIGH (ref <150)

## 2017-01-11 LAB — HIV ANTIBODY (ROUTINE TESTING W REFLEX): HIV 1&2 Ab, 4th Generation: NONREACTIVE

## 2017-01-11 LAB — TSH: TSH: 1.5 mIU/L

## 2017-01-11 MED ORDER — VITAMIN D (ERGOCALCIFEROL) 1.25 MG (50000 UNIT) PO CAPS: 50000.0000 [IU] | ORAL_CAPSULE | ORAL | 0 refills | Status: DC

## 2017-01-12 LAB — QUANTIFERON TB GOLD ASSAY (BLOOD)
Mitogen-Nil: >10 IU/mL
QUANTIFERON(R)-TB GOLD: NEGATIVE
Quantiferon Nil Value: 0.05 IU/mL
Quantiferon Tb Ag Minus Nil Value: <0 IU/mL

## 2017-01-12 LAB — MEASLES/MUMPS/RUBELLA IMMUNITY
Mumps IgG: <9 AU/mL — ABNORMAL LOW
Rubella: 1.34 {index}
Rubeola IgG: 33.6 AU/mL

## 2017-01-12 LAB — HEPATITIS B SURFACE ANTIBODY, QUANTITATIVE: Hep B S AB Quant (Post): <5 m[IU]/mL — ABNORMAL LOW (ref >=10)

## 2017-02-10 ENCOUNTER — Other Ambulatory Visit: Payer: Self-pay | Admitting: Sports Medicine

## 2017-02-10 DIAGNOSIS — F419 Anxiety disorder, unspecified: Principal | ICD-10-CM

## 2017-02-10 DIAGNOSIS — F32A Depression, unspecified: Secondary | ICD-10-CM

## 2017-02-10 DIAGNOSIS — F329 Major depressive disorder, single episode, unspecified: Secondary | ICD-10-CM

## 2017-04-16 ENCOUNTER — Other Ambulatory Visit: Payer: Self-pay | Admitting: Sports Medicine

## 2017-05-01 ENCOUNTER — Emergency Department (INDEPENDENT_AMBULATORY_CARE_PROVIDER_SITE_OTHER)
Admission: EM | Admit: 2017-05-01 | Discharge: 2017-05-01 | Disposition: A | Discharge status: Home / Self Care | Payer: 59 | Source: Home / Self Care | Attending: Family Medicine | Admitting: Family Medicine

## 2017-05-01 ENCOUNTER — Encounter: Payer: Self-pay | Admitting: Emergency Medicine

## 2017-05-01 DIAGNOSIS — J039 Acute tonsillitis, unspecified: Secondary | ICD-10-CM

## 2017-05-01 DIAGNOSIS — Z20818 Contact with and (suspected) exposure to other bacterial communicable diseases: Secondary | ICD-10-CM

## 2017-05-01 DIAGNOSIS — N926 Irregular menstruation, unspecified: Secondary | ICD-10-CM

## 2017-05-01 LAB — POCT URINE PREGNANCY: Preg Test, Ur: NEGATIVE

## 2017-05-01 LAB — POCT RAPID STREP A (OFFICE): Rapid Strep A Screen: NEGATIVE

## 2017-05-01 NOTE — ED Triage Notes (Signed)
Pt c/o sore throat x3 days. Afebrile. States her son was dx strep today. She also request UPT because she is late for her period.

## 2017-05-01 NOTE — Discharge Instructions (Signed)
°  You may take 500mg acetaminophen every 4-6 hours or in combination with ibuprofen 400-600mg every 6-8 hours as needed for pain, inflammation, and fever. ° °Be sure to drink at least eight 8oz glasses of water to stay well hydrated and get at least 8 hours of sleep at night, preferably more while sick.  ° °

## 2017-05-01 NOTE — ED Provider Notes (Signed)
[facility]    CSN: [id number] Arrival date & time: 05/01/17  1817     History   Chief Complaint Chief Complaint  Patient presents with  . Sore Throat    HPI ELEANER DIBARTOLO is a 42 y.o. female.   HPI TABYTHA GRADILLAS is a 42 y.o. female presenting to UC with c/o sore throat for about 3 days.  She also noticed some white patches in the back of her throat.  Throat pain is mild compared to prior episodes of strep throat, per pt, however, her son was dx with strep today so she wants to be checked as well.  Denies fever, chills, n/v/d. Denies difficulty breathing or swallowing. Pt also reports her period being late. She is requesting a urine pregnancy test.    Past Medical History:  Diagnosis Date  . Depression   . Depression with anxiety   . Migraine   . Morbid obesity ([place])   . Panic disorder   . Seizures [facility])     Patient Active Problem List   Diagnosis Date Noted  . Major depressive disorder, recurrent episode, moderate ([place]) 11/14/2015  . 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 Severe episode of recurrent major depressive disorder, without psychotic features ([place])   . MDD (major depressive disorder), recurrent episode, severe ([place]) 10/20/2015  . Opioid use disorder, moderate, dependence ([place]) 10/20/2015  . Chronic pain syndrome 10/20/2015  . Low back pain 07/22/2015  . Excessive daytime sleepiness 10/28/2014  . Left cervical radiculopathy 04/02/2014  . Insomnia 12/01/2013  . Migraine without aura 08/04/2013  . Anxiety and depression 03/25/2013  . Hyperlipidemia 03/11/2012  . Annual physical exam 03/10/2012  . Obesity 03/10/2012  . TERATOMA 03/07/2010    Past Surgical History:  Procedure Laterality Date  . 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 DILATION AND CURETTAGE, DIAGNOSTIC / THERAPEUTIC    . SOFT TISSUE TUMOR RESECTION  06/17/07   chest/ wound reclosure 4/09'    OB History    Gravida Para Term Preterm AB Living   5 3 3    0 3   SAB TAB Ectopic Multiple  Live Births                   Home Medications    Prior to Admission medications   Medication Sig Start Date End Date Taking? Authorizing Provider  hydrOXYzine (ATARAX/VISTARIL) 50 MG tablet TAKE ONE TABLET BY MOUTH AT BEDTIME 04/16/17   Silverio Decamp, MD  rizatriptan (MAXALT-MLT) 5 MG disintegrating tablet Take 1 tablet (5 mg total) by mouth as needed for migraine. May repeat in 2 hours if needed 10/01/16   Silverio Decamp, MD  traZODone (DESYREL) 150 MG tablet Take 1 tablet (150 mg total) by mouth at bedtime. 11/14/16   Silverio Decamp, MD  venlafaxine XR (EFFEXOR-XR) 150 MG 24 hr capsule Take 1 capsule (150 mg total) by mouth daily with breakfast. 10/25/16   Silverio Decamp, MD  venlafaxine XR (EFFEXOR-XR) 75 MG 24 hr capsule TAKE 1 CAPSULE (75 MG TOTAL) BY MOUTH DAILY WITH BREAKFAST. 02/10/17   Silverio Decamp, MD  Vitamin D, Ergocalciferol, (DRISDOL) 50000 units CAPS capsule Take 1 capsule (50,000 Units total) by mouth every 7 (seven) days. Take for 8 total doses(weeks) 01/11/17   Silverio Decamp, MD    Family History Family History  Problem Relation Age of Onset  . Hypertension Mother   . Migraines Mother   . Depression Father   . Suicidality Father  Social History Social History   Tobacco Use  . Smoking status: Never Smoker  . Smokeless tobacco: Never Used  Substance Use Topics  . Alcohol use: No  . Drug use: No     Allergies   Codeine and Sumatriptan   Review of Systems Review of Systems  Constitutional: Negative for chills and fever.  HENT: Positive for sore throat. Negative for congestion, ear pain, trouble swallowing and voice change.   Respiratory: Negative for cough and shortness of breath.   Cardiovascular: Negative for chest pain and palpitations.  Gastrointestinal: Negative for abdominal pain, diarrhea, nausea and vomiting.  Genitourinary: Positive for menstrual problem (late). Negative for dysuria, frequency,  hematuria, pelvic pain, urgency, vaginal bleeding, vaginal discharge and vaginal pain.  Musculoskeletal: Negative for arthralgias, back pain and myalgias.  Skin: Negative for rash.     Physical Exam Triage Vital Signs ED Triage Vitals  Enc Vitals Group     BP 05/01/17 1851 (!) 148/92     Pulse Rate 05/01/17 1851 (!) 106     Resp --      Temp 05/01/17 1851 98 F (36.7 C)     Temp Source 05/01/17 1851 Oral     SpO2 05/01/17 1851 98 %     Weight 05/01/17 1851 285 lb (129.3 kg)     Height --      Head Circumference --      Peak Flow --      Pain Score 05/01/17 1852 0     Pain Loc --      Pain Edu? --      Excl. in [place]? --    No data found.  Updated Vital Signs BP (!) 148/92 (BP Location: Right Arm)   Pulse (!) 106   Temp 98 F (36.7 C) (Oral)   Wt 285 lb (129.3 kg)   SpO2 98%   BMI 50.49 kg/m   Visual Acuity Right Eye Distance:   Left Eye Distance:   Bilateral Distance:    Right Eye Near:   Left Eye Near:    Bilateral Near:     Physical Exam  Constitutional: She is oriented to person, place, and time. She appears well-developed and well-nourished.  Non-toxic appearance. She does not appear ill. No distress.  HENT:  Head: Normocephalic and atraumatic.  Right Ear: Tympanic membrane normal.  Left Ear: Tympanic membrane normal.  Nose: Nose normal.  Mouth/Throat: Uvula is midline and mucous membranes are normal. Oropharyngeal exudate, posterior oropharyngeal edema and posterior oropharyngeal erythema present. No tonsillar abscesses.  Eyes: EOM are normal.  Neck: Normal range of motion.  Cardiovascular: Normal rate and regular rhythm.  Pulmonary/Chest: Effort normal and breath sounds normal. No stridor. No respiratory distress. She has no wheezes. She has no rhonchi.  Musculoskeletal: Normal range of motion.  Neurological: She is alert and oriented to person, place, and time.  Skin: Skin is warm and dry.  Psychiatric: She has a normal mood and affect. Her behavior  is normal.  Nursing note and vitals reviewed.    UC Treatments / Results  Labs (all labs ordered are listed, but only abnormal results are displayed) Labs Reviewed  STREP A DNA PROBE  POCT RAPID STREP A (OFFICE)  POCT URINE PREGNANCY    EKG  EKG Interpretation None       Radiology No results found.  Procedures Procedures (including critical care time)  Medications Ordered in UC Medications - No data to display   Initial Impression / Assessment and Plan /  UC Course  I have reviewed the triage vital signs and the nursing notes.  Pertinent labs & imaging results that were available during my care of the patient were reviewed by me and considered in my medical decision making (see chart for details).     Rapid strep: NEGATIVE Culture sent.   Home care instructions provided F/u with PCP in 1 week if not improving.   Final Clinical Impressions(s) / UC Diagnoses   Final diagnoses:  Exudative tonsillitis  Exposure to strep throat  Late menses    ED Discharge Orders    None       Controlled Substance Prescriptions [place] Controlled Substance Registry consulted? Not Applicable   Tyrell Antonio 05/02/17 5400

## 2017-05-02 ENCOUNTER — Telehealth: Payer: Self-pay | Admitting: Emergency Medicine

## 2017-05-02 LAB — STREP A DNA PROBE: Group A Strep Probe: NOT DETECTED

## 2017-05-07 ENCOUNTER — Other Ambulatory Visit: Payer: Self-pay | Admitting: Sports Medicine

## 2017-05-07 DIAGNOSIS — F329 Major depressive disorder, single episode, unspecified: Secondary | ICD-10-CM

## 2017-05-07 DIAGNOSIS — F419 Anxiety disorder, unspecified: Principal | ICD-10-CM

## 2017-06-10 ENCOUNTER — Encounter: Payer: Self-pay | Admitting: Sports Medicine

## 2017-06-10 ENCOUNTER — Ambulatory Visit (INDEPENDENT_AMBULATORY_CARE_PROVIDER_SITE_OTHER): Payer: 59 | Admitting: Sports Medicine

## 2017-06-10 DIAGNOSIS — Z0000 Encounter for general adult medical examination without abnormal findings: Secondary | ICD-10-CM

## 2017-06-10 DIAGNOSIS — F419 Anxiety disorder, unspecified: Secondary | ICD-10-CM | POA: Diagnosis not present

## 2017-06-10 DIAGNOSIS — F329 Major depressive disorder, single episode, unspecified: Secondary | ICD-10-CM

## 2017-06-10 DIAGNOSIS — G43009 Migraine without aura, not intractable, without status migrainosus: Secondary | ICD-10-CM | POA: Diagnosis not present

## 2017-06-10 DIAGNOSIS — Z6841 Body Mass Index (BMI) 40.0 and over, adult: Secondary | ICD-10-CM

## 2017-06-10 DIAGNOSIS — F332 Major depressive disorder, recurrent severe without psychotic features: Secondary | ICD-10-CM

## 2017-06-10 DIAGNOSIS — F32A Depression, unspecified: Secondary | ICD-10-CM

## 2017-06-10 MED ORDER — VENLAFAXINE HCL ER 150 MG PO CP24: 150.0000 mg | ORAL_CAPSULE | Freq: Every day | ORAL | 3 refills | Status: DC

## 2017-06-10 MED ORDER — TOPIRAMATE ER 25 MG PO CAP24: ORAL_CAPSULE | ORAL | 3 refills | Status: DC

## 2017-06-10 MED ORDER — RIZATRIPTAN BENZOATE 5 MG PO TBDP: 5.0000 mg | ORAL_TABLET | ORAL | 0 refills | Status: DC | PRN

## 2017-06-10 NOTE — Progress Notes (Signed)
  Subjective:    CC: Follow-up  HPI: Depression: Well-controlled on Effexor, no suicidal or homicidal ideation.  Migraine headaches: 3-4/month, takes Maxalt regularly, she is interested in preventative treatment.  I reviewed the past medical history, family history, social history, surgical history, and allergies today and no changes were needed.  Please see the problem list section below in epic for further details.  Past Medical History: Past Medical History:  Diagnosis Date  . Depression   . Depression with anxiety   . Migraine   . Morbid obesity ([place])   . Panic disorder   . Seizures ([place])    Past Surgical History: Past Surgical History:  Procedure Laterality Date  . 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 DILATION AND CURETTAGE, DIAGNOSTIC / THERAPEUTIC    . SOFT TISSUE TUMOR RESECTION  06/17/07   chest/ wound reclosure 4/09'   Social History: Social History   Socioeconomic History  . Marital status: Married    Spouse name: None  . Number of children: None  . Years of education: None  . Highest education level: None  Social Needs  . Financial resource strain: None  . Food insecurity - worry: None  . Food insecurity - inability: None  . Transportation needs - medical: None  . Transportation needs - non-medical: None  Occupational History  . None  Tobacco Use  . Smoking status: Never Smoker  . Smokeless tobacco: Never Used  Substance and Sexual Activity  . Alcohol use: No  . Drug use: No  . Sexual activity: Yes    Birth control/protection: Condom  Other Topics Concern  . None  Social History Narrative  . None   Family History: Family History  Problem Relation Age of Onset  . Hypertension Mother   . Migraines Mother   . Depression Father   . Suicidality Father    Allergies: Allergies  Allergen Reactions  . Codeine Itching  . Sumatriptan     Palpitations   Medications: See med rec.  Review of Systems: No fevers, chills, night sweats, weight  loss, chest pain, or shortness of breath.   Objective:    General: Well Developed, well nourished, and in no acute distress.  Neuro: Alert and oriented x3, extra-ocular muscles intact, sensation grossly intact.  HEENT: Normocephalic, atraumatic, pupils equal round reactive to light, neck supple, no masses, no lymphadenopathy, thyroid nonpalpable.  Skin: Warm and dry, no rashes. Cardiac: Regular rate and rhythm, no murmurs rubs or gallops, no lower extremity edema.  Respiratory: Clear to auscultation bilaterally. Not using accessory muscles, speaking in full sentences.  Impression and Recommendations:    Migraine without aura Intolerant of Topamax, 3-4 migraines per month with regular use of Maxalt. Switching to Trokendi. Keep a migraine diary, return to see me in 1 month.  Severe episode of recurrent major depressive disorder, without psychotic features ([place]) Doing well with Effexor, refilling medication.  Annual physical exam Referral to OB/GYN for cervical cancer screening  Obesity I am waiting for the right time but I am going to refer her to bariatric surgery. I spent 25 minutes with this patient, greater than 50% was face-to-face time counseling regarding the above diagnoses  ___________________________________________ Gwen Her. Dianah Field, M.D., ABFM., CAQSM. Primary Care and [place] Instructor of [place] of [facility] of [employer]

## 2017-06-10 NOTE — Assessment & Plan Note (Signed)
I am waiting for the right time but I am going to refer her to bariatric surgery.

## 2017-06-10 NOTE — Assessment & Plan Note (Signed)
Doing well with Effexor, refilling medication.

## 2017-06-10 NOTE — Assessment & Plan Note (Signed)
Referral to OB/GYN for cervical cancer screening

## 2017-06-10 NOTE — Assessment & Plan Note (Signed)
Intolerant of Topamax, 3-4 migraines per month with regular use of Maxalt. Switching to Trokendi. Keep a migraine diary, return to see me in 1 month.

## 2017-07-11 ENCOUNTER — Encounter: Payer: Self-pay | Admitting: Sports Medicine

## 2017-07-11 ENCOUNTER — Ambulatory Visit (INDEPENDENT_AMBULATORY_CARE_PROVIDER_SITE_OTHER): Payer: 59 | Admitting: Sports Medicine

## 2017-07-11 DIAGNOSIS — G43009 Migraine without aura, not intractable, without status migrainosus: Secondary | ICD-10-CM

## 2017-07-11 MED ORDER — TOPIRAMATE ER 50 MG PO CAP24: 1.0000 | ORAL_CAPSULE | Freq: Every day | ORAL | 3 refills | Status: DC

## 2017-07-11 NOTE — Assessment & Plan Note (Signed)
Went from 4 migraines per month down to 1 migraine per month on 25 mg of long-acting topiramate, increasing to 50 mg, return in 1 month, continue migraine diary. No adverse effects.

## 2017-07-11 NOTE — Progress Notes (Signed)
  Subjective:    CC: Follow-up  HPI: Migraines: Historically was having 4/month, after Trokendi has dropped to 1/month, agreeable to go up on the dose.  I reviewed the past medical history, family history, social history, surgical history, and allergies today and no changes were needed.  Please see the problem list section below in epic for further details.  Past Medical History: Past Medical History:  Diagnosis Date  . Depression   . Depression with anxiety   . Migraine   . Morbid obesity ([place])   . Panic disorder   . Seizures ([place])    Past Surgical History: Past Surgical History:  Procedure Laterality Date  . 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 DILATION AND CURETTAGE, DIAGNOSTIC / THERAPEUTIC    . SOFT TISSUE TUMOR RESECTION  06/17/07   chest/ wound reclosure 4/09'   Social History: Social History   Socioeconomic History  . Marital status: Married    Spouse name: None  . Number of children: None  . Years of education: None  . Highest education level: None  Social Needs  . Financial resource strain: None  . Food insecurity - worry: None  . Food insecurity - inability: None  . Transportation needs - medical: None  . Transportation needs - non-medical: None  Occupational History  . None  Tobacco Use  . Smoking status: Never Smoker  . Smokeless tobacco: Never Used  Substance and Sexual Activity  . Alcohol use: No  . Drug use: No  . Sexual activity: Yes    Birth control/protection: Condom  Other Topics Concern  . None  Social History Narrative  . None   Family History: Family History  Problem Relation Age of Onset  . Hypertension Mother   . Migraines Mother   . Depression Father   . Suicidality Father    Allergies: Allergies  Allergen Reactions  . Codeine Itching  . Sumatriptan     Palpitations   Medications: See med rec.  Review of Systems: No fevers, chills, night sweats, weight loss, chest pain, or shortness of breath.   Objective:     General: Well Developed, well nourished, and in no acute distress.  Neuro: Alert and oriented x3, extra-ocular muscles intact, sensation grossly intact.  HEENT: Normocephalic, atraumatic, pupils equal round reactive to light, neck supple, no masses, no lymphadenopathy, thyroid nonpalpable.  Skin: Warm and dry, no rashes. Cardiac: Regular rate and rhythm, no murmurs rubs or gallops, no lower extremity edema.  Respiratory: Clear to auscultation bilaterally. Not using accessory muscles, speaking in full sentences.  Impression and Recommendations:    Migraine without aura Went from 4 migraines per month down to 1 migraine per month on 25 mg of long-acting topiramate, increasing to 50 mg, return in 1 month, continue migraine diary. No adverse effects. ___________________________________________ Gwen Her. Dianah Field, M.D., ABFM., CAQSM. Primary Care and [place] Instructor of [place] of [facility] of Medicine

## 2017-07-19 ENCOUNTER — Encounter: Payer: Self-pay | Admitting: Emergency Medicine

## 2017-07-19 ENCOUNTER — Emergency Department (INDEPENDENT_AMBULATORY_CARE_PROVIDER_SITE_OTHER)
Admission: EM | Admit: 2017-07-19 | Discharge: 2017-07-19 | Disposition: A | Discharge status: Home / Self Care | Payer: 59 | Source: Home / Self Care

## 2017-07-19 ENCOUNTER — Other Ambulatory Visit: Payer: Self-pay

## 2017-07-19 DIAGNOSIS — J209 Acute bronchitis, unspecified: Secondary | ICD-10-CM | POA: Diagnosis not present

## 2017-07-19 MED ORDER — BENZONATATE 100 MG PO CAPS: 100.0000 mg | ORAL_CAPSULE | Freq: Three times a day (TID) | ORAL | 0 refills | Status: DC

## 2017-07-19 MED ORDER — AMOXICILLIN-POT CLAVULANATE 875-125 MG PO TABS: 1.0000 | ORAL_TABLET | Freq: Two times a day (BID) | ORAL | 0 refills | Status: DC

## 2017-07-19 NOTE — ED Provider Notes (Signed)
[facility]    CSN: [id number] Arrival date & time: 07/19/17  1546     History   Chief Complaint Chief Complaint  Patient presents with  . Cough    HPI Regina [name] is a 42 y.o. female.   The history is provided by the patient. No language interpreter was used.  Cough  Cough characteristics:  Non-productive Sputum characteristics:  Nondescript Severity:  Moderate Onset quality:  Gradual Duration:  3 weeks Timing:  Constant Progression:  Worsening Chronicity:  New Smoker: no   Context: not with activity   Relieved by:  Nothing Worsened by:  Nothing Associated symptoms: no chest pain   Risk factors: no recent travel     Past Medical History:  Diagnosis Date  . Depression   . Depression with anxiety   . Migraine   . Morbid obesity ([place])   . Panic disorder   . Seizures [facility])     Patient Active Problem List   Diagnosis Date Noted  . Major depressive disorder, recurrent episode, moderate ([place]) 11/14/2015  . 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 Severe episode of recurrent major depressive disorder, without psychotic features ([place])   . MDD (major depressive disorder), recurrent episode, severe ([place]) 10/20/2015  . Opioid use disorder, moderate, dependence ([place]) 10/20/2015  . Chronic pain syndrome 10/20/2015  . Low back pain 07/22/2015  . Excessive daytime sleepiness 10/28/2014  . Left cervical radiculopathy 04/02/2014  . Insomnia 12/01/2013  . Migraine without aura 08/04/2013  . Anxiety and depression 03/25/2013  . Hyperlipidemia 03/11/2012  . Annual physical exam 03/10/2012  . Obesity 03/10/2012  . TERATOMA 03/07/2010    Past Surgical History:  Procedure Laterality Date  . 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 DILATION AND CURETTAGE, DIAGNOSTIC / THERAPEUTIC    . SOFT TISSUE TUMOR RESECTION  06/17/07   chest/ wound reclosure 4/09'    OB History    Gravida  5   Para  3   Term  3   Preterm      AB  0   Living  3     SAB      TAB     Ectopic      Multiple      Live Births               Home Medications    Prior to Admission medications   Medication Sig Start Date End Date Taking? Authorizing Provider  amoxicillin-clavulanate (AUGMENTIN) 875-125 MG tablet Take 1 tablet by mouth every 12 (twelve) hours. 07/19/17   Fransico Meadow, PA-C  benzonatate (TESSALON) 100 MG capsule Take 1 capsule (100 mg total) by mouth every 8 (eight) hours. 07/19/17   Fransico Meadow, PA-C  hydrOXYzine (ATARAX/VISTARIL) 50 MG tablet TAKE ONE TABLET BY MOUTH AT BEDTIME 04/16/17   Silverio Decamp, MD  rizatriptan (MAXALT-MLT) 5 MG disintegrating tablet Take 1 tablet (5 mg total) by mouth as needed for migraine. May repeat in 2 hours if needed 06/10/17   Silverio Decamp, MD  venlafaxine XR (EFFEXOR-XR) 150 MG 24 hr capsule Take 1 capsule (150 mg total) by mouth daily with breakfast. 06/10/17   Silverio Decamp, MD    Family History Family History  Problem Relation Age of Onset  . Hypertension Mother   . Migraines Mother   . Depression Father   . Suicidality Father     Social History Social History   Tobacco Use  . Smoking status: Never Smoker  .  Smokeless tobacco: Never Used  Substance Use Topics  . Alcohol use: No  . Drug use: No     Allergies   Codeine and Sumatriptan   Review of Systems Review of Systems  Respiratory: Positive for cough.   Cardiovascular: Negative for chest pain.  All other systems reviewed and are negative.    Physical Exam Triage Vital Signs ED Triage Vitals [07/19/17 1606]  Enc Vitals Group     BP 121/79     Pulse Rate 94     Resp 18     Temp 97.6 F (36.4 C)     Temp Source Oral     SpO2 97 %     Weight 284 lb (128.8 kg)     Height 5\' 3"  (1.6 m)     Head Circumference      Peak Flow      Pain Score 0     Pain Loc      Pain Edu?      Excl. in [place]?    No data found.  Updated Vital Signs BP 121/79 (BP Location: Right Arm)   Pulse 94   Temp 97.6 F (36.4  C) (Oral)   Resp 18   Ht 5\' 3"  (1.6 m)   Wt 284 lb (128.8 kg)   LMP 07/17/2017   SpO2 97%   BMI 50.31 kg/m   Visual Acuity Right Eye Distance:   Left Eye Distance:   Bilateral Distance:    Right Eye Near:   Left Eye Near:    Bilateral Near:     Physical Exam  Constitutional: She appears well-developed and well-nourished. No distress.  HENT:  Head: Normocephalic and atraumatic.  Eyes: Conjunctivae are normal.  Neck: Neck supple.  Cardiovascular: Normal rate and regular rhythm.  No murmur heard. Pulmonary/Chest: Effort normal and breath sounds normal. No respiratory distress.  Abdominal: Soft. There is no tenderness.  Musculoskeletal: She exhibits no edema.  Neurological: She is alert.  Skin: Skin is warm and dry.  Psychiatric: She has a normal mood and affect.  Nursing note and vitals reviewed.    UC Treatments / Results  Labs (all labs ordered are listed, but only abnormal results are displayed) Labs Reviewed - No data to display  EKG None Radiology No results found.  Procedures Procedures (including critical care time)  Medications Ordered in UC Medications - No data to display   Initial Impression / Assessment and Plan / UC Course  I have reviewed the triage vital signs and the nursing notes.  Pertinent labs & imaging results that were available during my care of the patient were reviewed by me and considered in my medical decision making (see chart for details).     Final Clinical Impressions(s) / UC Diagnoses   Final diagnoses:  Acute bronchitis, unspecified organism    ED Discharge Orders        Ordered    amoxicillin-clavulanate (AUGMENTIN) 875-125 MG tablet  Every 12 hours     07/19/17 1610    benzonatate (TESSALON) 100 MG capsule  Every 8 hours     07/19/17 1610       Controlled Substance Prescriptions [place] Controlled Substance Registry consulted?    Fransico Meadow, [place] 07/19/17 4742

## 2017-07-19 NOTE — Discharge Instructions (Signed)
Return if any problems.

## 2017-07-19 NOTE — ED Triage Notes (Signed)
Patient has had URI in past 3 weeks that seemed to resolve; now has cough that is severe; denies fever.

## 2017-07-23 ENCOUNTER — Emergency Department (INDEPENDENT_AMBULATORY_CARE_PROVIDER_SITE_OTHER): Payer: 59

## 2017-07-23 ENCOUNTER — Other Ambulatory Visit: Payer: Self-pay

## 2017-07-23 ENCOUNTER — Encounter: Payer: Self-pay | Admitting: *Deleted

## 2017-07-23 ENCOUNTER — Emergency Department (INDEPENDENT_AMBULATORY_CARE_PROVIDER_SITE_OTHER)
Admission: EM | Admit: 2017-07-23 | Discharge: 2017-07-23 | Disposition: A | Discharge status: Home / Self Care | Payer: 59 | Source: Home / Self Care | Attending: Family Medicine | Admitting: Family Medicine

## 2017-07-23 DIAGNOSIS — R05 Cough: Secondary | ICD-10-CM

## 2017-07-23 DIAGNOSIS — R059 Cough, unspecified: Secondary | ICD-10-CM

## 2017-07-23 DIAGNOSIS — R058 Other specified cough: Secondary | ICD-10-CM

## 2017-07-23 LAB — POCT CBC W AUTO DIFF (K'VILLE URGENT CARE)

## 2017-07-23 MED ORDER — PREDNISONE 20 MG PO TABS: ORAL_TABLET | ORAL | 0 refills | Status: DC

## 2017-07-23 MED ORDER — ALBUTEROL SULFATE HFA 108 (90 BASE) MCG/ACT IN AERS: 2.0000 | INHALATION_SPRAY | RESPIRATORY_TRACT | 0 refills | Status: DC | PRN

## 2017-07-23 MED ORDER — BENZONATATE 100 MG PO CAPS: ORAL_CAPSULE | ORAL | 0 refills | Status: DC

## 2017-07-23 MED ORDER — AZITHROMYCIN 250 MG PO TABS: 250.0000 mg | ORAL_TABLET | Freq: Every day | ORAL | 0 refills | Status: DC

## 2017-07-23 NOTE — ED Triage Notes (Signed)
Pt c/o continued cough after her visit on 07/19/17. She has taken tessalon perles without relief. Denies fever. She now also c/o hoarseness.

## 2017-07-23 NOTE — ED Provider Notes (Signed)
[facility]    CSN: [id number] Arrival date & time: 07/23/17  1430     History   Chief Complaint Chief Complaint  Patient presents with  . Cough    HPI Regina [name] is a 42 y.o. female.   Patient was here 4 days ago with the history of having had an upper respiratory infection 3 weeks earlier.  She got improved over 1 week, felt better for 1 week, then developed the cough which had persisted for a week prior to being treated.  She was treated with Augmentin and has not gotten any better.  She is coughing all the time.  She is not febrile.  She is coughing up some yellow phlegm.  She has a little bit of nasal congestion but not much.  She does not smoke.  She is married and has a couple of children, none of whom are ill.  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  She feels bad from the constant coughing.  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  She does not have a history of asthma or recurrent and/or prolonged coughs.  She is only on psychiatric medications.  Allergies are noted.  Has had a sternotomy for a teratoma.  HPI  Past Medical History:  Diagnosis Date  . Depression   . Depression with anxiety   . Migraine   . Morbid obesity ([place])   . Panic disorder   . Seizures [facility])     Patient Active Problem List   Diagnosis Date Noted  . Major depressive disorder, recurrent episode, moderate ([place]) 11/14/2015  . 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 Severe episode of recurrent major depressive disorder, without psychotic features ([place])   . MDD (major depressive disorder), recurrent episode, severe ([place]) 10/20/2015  . Opioid use disorder, moderate, dependence ([place]) 10/20/2015  . Chronic pain syndrome 10/20/2015  . Low back pain 07/22/2015  . Excessive daytime sleepiness 10/28/2014  . Left cervical radiculopathy 04/02/2014  . Insomnia 12/01/2013  . Migraine without aura  08/04/2013  . Anxiety and depression 03/25/2013  . Hyperlipidemia 03/11/2012  . Annual physical exam 03/10/2012  . Obesity 03/10/2012  . TERATOMA 03/07/2010    Past Surgical History:  Procedure Laterality Date  . 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 DILATION AND CURETTAGE, DIAGNOSTIC / THERAPEUTIC    . SOFT TISSUE TUMOR RESECTION  06/17/07   chest/ wound reclosure 4/09'    OB History    Gravida  5   Para  3   Term  3   Preterm      AB  0   Living  3     SAB      TAB      Ectopic      Multiple      Live Births               Home Medications    Prior to Admission medications   Medication Sig Start Date End Date Taking? Authorizing Provider  amoxicillin-clavulanate (AUGMENTIN) 875-125 MG tablet Take 1 tablet by mouth every 12 (twelve) hours. 07/19/17   Fransico Meadow, PA-C  benzonatate (TESSALON) 100 MG capsule Take 1 capsule (100 mg total) by mouth every 8 (eight) hours. 07/19/17   Fransico Meadow, PA-C  hydrOXYzine (ATARAX/VISTARIL) 50 MG tablet TAKE ONE TABLET BY MOUTH AT BEDTIME 04/16/17  Silverio Decamp, MD  rizatriptan (MAXALT-MLT) 5 MG disintegrating tablet Take 1 tablet (5 mg total) by mouth as needed for migraine. May repeat in 2 hours if needed 06/10/17   Silverio Decamp, MD  venlafaxine XR (EFFEXOR-XR) 150 MG 24 hr capsule Take 1 capsule (150 mg total) by mouth daily with breakfast. 06/10/17   Silverio Decamp, MD    Family History Family History  Problem Relation Age of Onset  . Hypertension Mother   . Migraines Mother   . Depression Father   . Suicidality Father     Social History Social History   Tobacco Use  . Smoking status: Never Smoker  . Smokeless tobacco: Never Used  Substance Use Topics  . Alcohol use: No  . Drug use: No     Allergies   Codeine and Sumatriptan   Review of Systems Review of Systems No headache or dizziness.  No fevers. Respiratory: No shortness of breath.  Cough as noted above.   No wheezing. Cardiovascular: Unremarkable GI: Unremarkable   Physical Exam Triage Vital Signs ED Triage Vitals  Enc Vitals Group     BP 07/23/17 1444 (!) 151/94     Pulse Rate 07/23/17 1444 93     Resp 07/23/17 1444 18     Temp 07/23/17 1444 97.7 F (36.5 C)     Temp Source 07/23/17 1444 Oral     SpO2 07/23/17 1444 98 %     Weight 07/23/17 1445 282 lb (127.9 kg)     Height 07/23/17 1445 5\' 3"  (1.6 m)     Head Circumference --      Peak Flow --      Pain Score 07/23/17 1445 0     Pain Loc --      Pain Edu? --      Excl. in [place]? --    No data found.  Updated Vital Signs BP (!) 151/94 (BP Location: Right Arm)   Pulse 93   Temp 97.7 F (36.5 C) (Oral)   Resp 18   Ht 5\' 3"  (1.6 m)   Wt 282 lb (127.9 kg)   LMP 07/17/2017   SpO2 98%   BMI 49.95 kg/m   Visual Acuity Right Eye Distance:   Left Eye Distance:   Bilateral Distance:    Right Eye Near:   Left Eye Near:    Bilateral Near:     Physical Exam Obese lady pleasant, alert, coughing a lot.  Her TMs are normal.  Throat not erythematous with and without exudate.  Neck supple without significant nodes.  Chest is clear to auscultation.  Heart regular without murmurs gallops or arrhythmias.  On forced expiration there is a bare minimum of end expiratory wheeze.  No tachypnea or tachycardia.  UC Treatments / Results  Labs (all labs ordered are listed, but only abnormal results are displayed) Labs Reviewed  POCT CBC W AUTO DIFF ([facility])  wbc 11,000  EKG None Radiology No results found.  Procedures Procedures (including critical care time)  Medications Ordered in UC Medications - No data to display   Initial Impression / Assessment and Plan / UC Course  I have reviewed the triage vital signs and the nursing notes.  Pertinent labs & imaging results that were available during my care of the patient were reviewed by me and considered in my medical decision making (see chart for details).      Cough, most consistent with a post viral cough syndrome, nonresponsive to antibiotics  Final Clinical Impressions(s) / UC Diagnoses   Final diagnoses:  Cough  Post-viral cough syndrome    ED Discharge Orders    None       Controlled Substance Prescriptions [place] Controlled Substance Registry consulted? No   Posey Boyer, MD 07/23/17 1558

## 2017-07-23 NOTE — Discharge Instructions (Addendum)
Drink lots of fluids and get enough rest  Discontinue the Augmentin (amoxicillin/clavulanate)  Begin azithromycin 250 mg 2 pills initially, then 1 daily for 4 days (Z-Pak)  Continue using the benzonatate cough pills but can increase to 2 pills every 8 hours if needed.  In addition to the benzonatate take some Mucinex DM over-the-counter  Begin prednisone 20 mg 3 pills daily for 2 days, then 2 daily for 2 days, then 1 daily for 2 days.  These are best taken in the morning after breakfast.  If having bad fits of coughing try using an albuterol inhaler 2 inhalations every 4-6 hours as directed.  You may feel a little nervous and jittery after using this, but that is just a side effect of the medicine which will taper off very quickly.  Return if not improving

## 2017-08-20 ENCOUNTER — Other Ambulatory Visit: Payer: Self-pay

## 2017-08-20 DIAGNOSIS — G43009 Migraine without aura, not intractable, without status migrainosus: Secondary | ICD-10-CM

## 2017-08-20 MED ORDER — TOPIRAMATE ER 50 MG PO CAP24: 1.0000 | ORAL_CAPSULE | Freq: Every day | ORAL | 0 refills | Status: DC

## 2017-09-21 ENCOUNTER — Other Ambulatory Visit: Payer: Self-pay | Admitting: Sports Medicine

## 2017-09-21 DIAGNOSIS — F332 Major depressive disorder, recurrent severe without psychotic features: Secondary | ICD-10-CM

## 2017-09-21 DIAGNOSIS — F329 Major depressive disorder, single episode, unspecified: Secondary | ICD-10-CM

## 2017-09-21 DIAGNOSIS — F419 Anxiety disorder, unspecified: Principal | ICD-10-CM

## 2017-10-04 ENCOUNTER — Other Ambulatory Visit: Payer: Self-pay | Admitting: Sports Medicine

## 2017-10-14 ENCOUNTER — Ambulatory Visit: Payer: 59 | Admitting: Sports Medicine

## 2017-12-21 ENCOUNTER — Encounter: Payer: Self-pay | Admitting: Sports Medicine

## 2017-12-22 ENCOUNTER — Encounter: Payer: Self-pay | Admitting: Emergency Medicine

## 2017-12-22 ENCOUNTER — Emergency Department (INDEPENDENT_AMBULATORY_CARE_PROVIDER_SITE_OTHER)
Admission: EM | Admit: 2017-12-22 | Discharge: 2017-12-22 | Disposition: A | Discharge status: Home / Self Care | Payer: 59 | Source: Home / Self Care | Attending: Family Medicine | Admitting: Family Medicine

## 2017-12-22 ENCOUNTER — Other Ambulatory Visit: Payer: Self-pay

## 2017-12-22 DIAGNOSIS — B0222 Postherpetic trigeminal neuralgia: Secondary | ICD-10-CM

## 2017-12-22 NOTE — ED Triage Notes (Signed)
Pt presents today for a work note. States she was seen yesterday at [facility] and dx with shingles but they told her she not attend work for 3-4 days due to the drainage and working at an [employer]. She is here today hoping for a note to return tomorrow. She states she stopped by here to be seen yesterday before the ER and was refused care (she was requesting pain medications, and was told we did not offer those meds)

## 2017-12-22 NOTE — ED Provider Notes (Signed)
[facility]    CSN: [id number] Arrival date & time: 12/22/17  1457     History   Chief Complaint Chief Complaint  Patient presents with  . Herpes Zoster    HPI Regina [name] is a 42 y.o. female.   HPI Regina [name] is a 42 y.o. female presenting to UC with request for a note to return to work after being dx with singles on the Left side of her forehead yesterday.  Pt states the rash developed about 1 week ago.  She had a [facility] visit yesterday, who dx her with shingles. She was prescribed Valtrex and gabapentin was recommended. Pt went to the ED yesterday for pain medication. She was advised she could return to work but did not receive a work note. Pt states pain has improved and the rash has been scabbed over.  She works as an Visual merchandiser.  Denies oozing from the rash. Denies fever or chills. No n/v/d. No rash on ear, around eye or nose. She is taking the Valtrex as prescribed.    Past Medical History:  Diagnosis Date  . Depression   . Depression with anxiety   . Migraine   . Morbid obesity ([place])   . Panic disorder   . Seizures [facility])     Patient Active Problem List   Diagnosis Date Noted  . Major depressive disorder, recurrent episode, moderate ([place]) 11/14/2015  . 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 Severe episode of recurrent major depressive disorder, without psychotic features ([place])   . MDD (major depressive disorder), recurrent episode, severe ([place]) 10/20/2015  . Opioid use disorder, moderate, dependence ([place]) 10/20/2015  . Chronic pain syndrome 10/20/2015  . Low back pain 07/22/2015  . Excessive daytime sleepiness 10/28/2014  . Left cervical radiculopathy 04/02/2014  . Insomnia 12/01/2013  . Migraine without aura 08/04/2013  . Anxiety and depression 03/25/2013  . Hyperlipidemia 03/11/2012  . Annual physical exam 03/10/2012  . Obesity 03/10/2012  . TERATOMA 03/07/2010    Past Surgical History:  Procedure Laterality Date  . 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 DILATION AND CURETTAGE, DIAGNOSTIC / THERAPEUTIC    . SOFT TISSUE TUMOR RESECTION  06/17/07   chest/ wound reclosure 4/09'    OB History    Gravida  5   Para  3   Term  3   Preterm      AB  0   Living  3     SAB      TAB      Ectopic      Multiple      Live Births               Home Medications    Prior to Admission medications   Medication Sig Start Date End Date Taking? Authorizing Provider  gabapentin (NEURONTIN) 100 MG capsule Take 100 mg by mouth 3 (three) times daily.   Yes [provider]  albuterol (PROVENTIL HFA;VENTOLIN HFA) 108 (90 Base) MCG/ACT inhaler Inhale 2 puffs into the lungs every 4 (four) hours as needed for wheezing or shortness of breath (cough, shortness of breath or wheezing.). 07/23/17   Posey Boyer, MD  amoxicillin-clavulanate (AUGMENTIN) 875-125 MG tablet Take 1 tablet by mouth every 12 (twelve) hours. 07/19/17   Fransico Meadow, PA-C  azithromycin (ZITHROMAX) 250 MG tablet Take 1 tablet (250 mg total) by mouth daily. Take first 2 tablets together, then 1 every day until finished. 07/23/17   Ruben Reason  H, MD  benzonatate (TESSALON) 100 MG capsule Take 1 or 2 pills times daily as needed for cough. 07/23/17   Posey Boyer, MD  hydrOXYzine (ATARAX/VISTARIL) 50 MG tablet TAKE ONE TABLET BY MOUTH AT BEDTIME 10/04/17   Silverio Decamp, MD  predniSONE (DELTASONE) 20 MG tablet Take 3 daily for 2 days, then 2 daily for 2 days, then 1 daily for 2 days for post viral cough syndrome.Marland Kitchen  Best taken after breakfast. 07/23/17   Posey Boyer, MD  rizatriptan (MAXALT-MLT) 5 MG disintegrating tablet Take 1 tablet (5 mg total) by mouth as needed for migraine. May repeat in 2 hours if needed 06/10/17   Silverio Decamp, MD  Topiramate ER (TROKENDI XR) 50 MG CP24 Take 1 capsule by mouth daily. 08/20/17   Silverio Decamp, MD  venlafaxine XR (EFFEXOR-XR) 150 MG 24 hr capsule TAKE 1 CAPSULE (150 MG TOTAL) BY  MOUTH DAILY WITH BREAKFAST. 09/23/17   Silverio Decamp, MD    Family History Family History  Problem Relation Age of Onset  . Hypertension Mother   . Migraines Mother   . Depression Father   . Suicidality Father     Social History Social History   Tobacco Use  . Smoking status: Never Smoker  . Smokeless tobacco: Never Used  Substance Use Topics  . Alcohol use: No  . Drug use: No     Allergies   Codeine and Sumatriptan   Review of Systems Review of Systems  Constitutional: Negative for chills and fever.  HENT: Negative for ear pain and hearing loss.   Eyes: Negative for photophobia, pain and visual disturbance.  Gastrointestinal: Negative for diarrhea, nausea and vomiting.  Skin: Positive for rash.  Neurological: Negative for dizziness, light-headedness and headaches.     Physical Exam Triage Vital Signs ED Triage Vitals [12/22/17 1531]  Enc Vitals Group     BP 134/84     Pulse Rate 88     Resp      Temp 98.2 F (36.8 C)     Temp Source Oral     SpO2 99 %     Weight 287 lb 12.8 oz (130.5 kg)     Height 5\' 3"  (1.6 m)     Head Circumference      Peak Flow      Pain Score 6     Pain Loc      Pain Edu?      Excl. in [place]?    No data found.  Updated Vital Signs BP 134/84 (BP Location: Left Arm)   Pulse 88   Temp 98.2 F (36.8 C) (Oral)   Ht 5\' 3"  (1.6 m)   Wt 287 lb 12.8 oz (130.5 kg)   LMP  (LMP Unknown)   SpO2 99%   BMI 50.98 kg/m   Visual Acuity Right Eye Distance:   Left Eye Distance:   Bilateral Distance:    Right Eye Near:   Left Eye Near:    Bilateral Near:     Physical Exam  Constitutional: She is oriented to person, place, and time. She appears well-developed and well-nourished. No distress.  HENT:  Head: Normocephalic and atraumatic.    Right Ear: Tympanic membrane normal.  Left Ear: Tympanic membrane normal.  Nose: Nose normal. Right sinus exhibits no maxillary sinus tenderness and no frontal sinus tenderness. Left  sinus exhibits no maxillary sinus tenderness and no frontal sinus tenderness.  Mouth/Throat: Uvula is midline, oropharynx is clear and moist  and mucous membranes are normal.  Left side forehead at hairline: 2x4cm area of faint erythema and dried scabbed over lesions. Mildly tender. No bleeding or discharge.  No lesions or rash around Left ear, eye, nose, or cheek.  Eyes: EOM are normal.  Neck: Normal range of motion.  Cardiovascular: Normal rate.  Pulmonary/Chest: Effort normal.  Musculoskeletal: Normal range of motion.  Neurological: She is alert and oriented to person, place, and time.  Skin: Skin is warm and dry. She is not diaphoretic.  Psychiatric: She has a normal mood and affect. Her behavior is normal.  Nursing note and vitals reviewed.    UC Treatments / Results  Labs (all labs ordered are listed, but only abnormal results are displayed) Labs Reviewed - No data to display  EKG None  Radiology No results found.  Procedures Procedures (including critical care time)  Medications Ordered in UC Medications - No data to display  Initial Impression / Assessment and Plan / UC Course  I have reviewed the triage vital signs and the nursing notes.  Pertinent labs & imaging results that were available during my care of the patient were reviewed by me and considered in my medical decision making (see chart for details).     Hx and exam c/w resolving shingles rash to Left upper forehead w/o complication. 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  Note provided for pt to return on Tuesday, 12/24/17 Home care instructions provided.  Final Clinical Impressions(s) / UC Diagnoses   Final diagnoses:  Trigeminal herpes zoster     Discharge Instructions      Keep area covered as much as possible. Wash your hands frequently, especially after touching the rash.   Do not allow others to get close to your face or  hair to help limit spreading of the virus.  Continue to take the anti-viral medication as prescribed.     ED Prescriptions    None     Controlled Substance Prescriptions [place] Controlled Substance Registry consulted? Not Applicable   Tyrell Antonio 12/22/17 2162

## 2017-12-22 NOTE — Discharge Instructions (Signed)
°  Keep area covered as much as possible. Wash your hands frequently, especially after touching the rash.   Do not allow others to get close to your face or hair to help limit spreading of the virus.  Continue to take the anti-viral medication as prescribed.

## 2017-12-23 ENCOUNTER — Encounter (HOSPITAL_COMMUNITY): Payer: Self-pay

## 2017-12-23 ENCOUNTER — Emergency Department (HOSPITAL_COMMUNITY)
Admission: EM | Admit: 2017-12-23 | Discharge: 2017-12-24 | Disposition: A | Discharge status: Home / Self Care | Payer: 59 | Attending: Emergency Medicine | Admitting: Emergency Medicine

## 2017-12-23 ENCOUNTER — Other Ambulatory Visit: Payer: Self-pay

## 2017-12-23 DIAGNOSIS — B028 Zoster with other complications: Secondary | ICD-10-CM

## 2017-12-23 DIAGNOSIS — H538 Other visual disturbances: Secondary | ICD-10-CM | POA: Insufficient documentation

## 2017-12-23 DIAGNOSIS — H5712 Ocular pain, left eye: Secondary | ICD-10-CM | POA: Diagnosis not present

## 2017-12-23 DIAGNOSIS — R21 Rash and other nonspecific skin eruption: Secondary | ICD-10-CM | POA: Diagnosis present

## 2017-12-23 DIAGNOSIS — Z79899 Other long term (current) drug therapy: Secondary | ICD-10-CM | POA: Diagnosis not present

## 2017-12-23 MED ORDER — GABAPENTIN 100 MG PO CAPS
100.0000 mg | ORAL_CAPSULE | Freq: Three times a day (TID) | ORAL | 3 refills | Status: DC | PRN
Start: 2017-12-23 — End: 2017-12-31

## 2017-12-23 NOTE — ED Triage Notes (Signed)
Patient here for diagnosed shingles with the rash getting bigger after being on Valtrex.  Also complaining of being achy and tired.  Called RN advise line and told to come to ED due to the rash getting bigger.  Rash going to left face and forehead and down to the eye.  No oozing from rash.  Cloudy vision to the left eye and having occular pain.

## 2017-12-24 MED ORDER — TRIFLURIDINE 1 % OP SOLN: 1.0000 [drp] | OPHTHALMIC | 0 refills | Status: DC

## 2017-12-24 MED ORDER — PROPARACAINE HCL 0.5 % OP SOLN
1.0000 [drp] | Freq: Once | OPHTHALMIC | Status: AC
  Administered 2017-12-24: 1 [drp] via OPHTHALMIC
  Filled 2017-12-24: qty 15

## 2017-12-24 MED ORDER — FLUORESCEIN SODIUM 1 MG OP STRP
1.0000 | ORAL_STRIP | Freq: Once | OPHTHALMIC | Status: AC
  Administered 2017-12-24: 1 via OPHTHALMIC
  Filled 2017-12-24: qty 1

## 2017-12-24 NOTE — ED Notes (Signed)
Patient verbalizes understanding of medications and discharge instructions. No further questions at this time. VSS and patient ambulatory at discharge.   

## 2017-12-24 NOTE — Discharge Instructions (Addendum)
Follow up with Dr. Ellie Lunch for repeat evaluation and management of left eye pain in the setting of left trigeminal herpes zoster.

## 2017-12-24 NOTE — ED Provider Notes (Signed)
[facility] EMERGENCY DEPARTMENT Provider Note   CSN: [id number] Arrival date & time: 12/23/17  2149     History   Chief Complaint Chief Complaint  Patient presents with  . Rash    HPI Regina [name] is a 42 y.o. female.  Patient diagnosed with shingles to left forehead on 12/22/17 and started on gabapentin, prednisone and Valtrex. She presents tonight with extension of the facial rash to the upper left eye lid. She reports cloudy vision earlier that has resolved. No redness or drainage of the eye.   The history is provided by the patient. No language interpreter was used.    Past Medical History:  Diagnosis Date  . Depression   . Depression with anxiety   . Migraine   . Morbid obesity ([place])   . Panic disorder   . Seizures [facility])     Patient Active Problem List   Diagnosis Date Noted  . Major depressive disorder, recurrent episode, moderate ([place]) 11/14/2015  . 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 Severe episode of recurrent major depressive disorder, without psychotic features ([place])   . MDD (major depressive disorder), recurrent episode, severe ([place]) 10/20/2015  . Opioid use disorder, moderate, dependence ([place]) 10/20/2015  . Chronic pain syndrome 10/20/2015  . Low back pain 07/22/2015  . Excessive daytime sleepiness 10/28/2014  . Left cervical radiculopathy 04/02/2014  . Insomnia 12/01/2013  . Migraine without aura 08/04/2013  . Anxiety and depression 03/25/2013  . Hyperlipidemia 03/11/2012  . Annual physical exam 03/10/2012  . Obesity 03/10/2012  . TERATOMA 03/07/2010    Past Surgical History:  Procedure Laterality Date  . 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 DILATION AND CURETTAGE, DIAGNOSTIC / THERAPEUTIC    . SOFT TISSUE TUMOR RESECTION  06/17/07   chest/ wound reclosure 4/09'     OB History    Gravida  5   Para  3   Term  3   Preterm      AB  0   Living  3     SAB      TAB      Ectopic      Multiple      Live Births               Home Medications    Prior to Admission medications   Medication Sig Start Date End Date Taking? Authorizing Provider  gabapentin (NEURONTIN) 300 MG capsule Take 300 mg by mouth 3 (three) times daily. 12/21/17  Yes [provider]  hydrOXYzine (ATARAX/VISTARIL) 50 MG tablet TAKE ONE TABLET BY MOUTH AT BEDTIME Patient taking differently: Take 50 mg by mouth at bedtime.  10/04/17  Yes Silverio Decamp, MD  predniSONE (DELTASONE) 10 MG tablet Take 10-40 mg by mouth See admin instructions. Take 4 tablets for 2 days then take 3 tablets for 2 days, take 2 tablets for 2 days then take 1 tablet for 2 days 12/21/17 12/31/17 Yes [provider]  Topiramate ER (TROKENDI XR) 50 MG CP24 Take 1 capsule by mouth daily. 08/20/17  Yes Silverio Decamp, MD  valACYclovir (VALTREX) 1000 MG tablet Take 1,000 mg by mouth 3 (three) times daily. 12/21/17 01/04/18 Yes [provider]  venlafaxine XR (EFFEXOR-XR) 150 MG 24 hr capsule TAKE 1 CAPSULE (150 MG TOTAL) BY MOUTH DAILY WITH BREAKFAST. 09/23/17  Yes Silverio Decamp, MD  albuterol (PROVENTIL HFA;VENTOLIN HFA) 108 (90 Base) MCG/ACT inhaler Inhale 2 puffs into the lungs every 4 (four) hours as  needed for wheezing or shortness of breath (cough, shortness of breath or wheezing.). Patient not taking: Reported on 12/24/2017 07/23/17   Posey Boyer, MD  amoxicillin-clavulanate (AUGMENTIN) 875-125 MG tablet Take 1 tablet by mouth every 12 (twelve) hours. Patient not taking: Reported on 12/24/2017 07/19/17   Fransico Meadow, PA-C  azithromycin (ZITHROMAX) 250 MG tablet Take 1 tablet (250 mg total) by mouth daily. Take first 2 tablets together, then 1 every day until finished. Patient not taking: Reported on 12/24/2017 07/23/17   Posey Boyer, MD  benzonatate (TESSALON) 100 MG capsule Take 1 or 2 pills times daily as needed for cough. Patient not taking: Reported on 12/24/2017 07/23/17   Posey Boyer, MD  gabapentin  (NEURONTIN) 100 MG capsule Take 1 capsule (100 mg total) by mouth 3 (three) times daily as needed. Patient not taking: Reported on 12/24/2017 12/23/17   Silverio Decamp, MD  predniSONE (DELTASONE) 20 MG tablet Take 3 daily for 2 days, then 2 daily for 2 days, then 1 daily for 2 days for post viral cough syndrome.Marland Kitchen  Best taken after breakfast. Patient not taking: Reported on 12/24/2017 07/23/17   Posey Boyer, MD  rizatriptan (MAXALT-MLT) 5 MG disintegrating tablet Take 1 tablet (5 mg total) by mouth as needed for migraine. May repeat in 2 hours if needed Patient not taking: Reported on 12/24/2017 06/10/17   Silverio Decamp, MD    Family History Family History  Problem Relation Age of Onset  . Hypertension Mother   . Migraines Mother   . Depression Father   . Suicidality Father     Social History Social History   Tobacco Use  . Smoking status: Never Smoker  . Smokeless tobacco: Never Used  Substance Use Topics  . Alcohol use: No  . Drug use: No     Allergies   Codeine and Sumatriptan   Review of Systems Review of Systems  Constitutional: Positive for fatigue. Negative for fever.       Malaise  Eyes: Positive for visual disturbance ("cloudy" earlier, no visual disturbance now.).  Musculoskeletal: Positive for myalgias.  Skin: Positive for rash.  Neurological: Negative for headaches.     Physical Exam Updated Vital Signs BP (!) 152/84 (BP Location: Right Arm)   Pulse (!) 103   Temp 97.7 F (36.5 C) (Oral)   Resp 16   LMP  (LMP Unknown)   SpO2 99%   Physical Exam  Constitutional: She is oriented to person, place, and time. She appears well-developed and well-nourished.  Eyes: Pupils are equal, round, and reactive to light. Conjunctivae and EOM are normal. Right eye exhibits no discharge. Left eye exhibits no discharge.  There is no visualized fluorescein uptake in the left eye.   Neck: Normal range of motion.  Pulmonary/Chest: Effort normal.    Neurological: She is alert and oriented to person, place, and time.  Skin: Skin is warm and dry.  Raised rash to upper left forehead, partial scabbing, minimal erythema and no intact vesicles. Does not cross the midline. Left upper eye lid is edematous with similar raised, normopigmented rash.      ED Treatments / Results  Labs (all labs ordered are listed, but only abnormal results are displayed) Labs Reviewed - No data to display  EKG None  Radiology No results found.  Procedures Procedures (including critical care time)  Medications Ordered in ED Medications  fluorescein ophthalmic strip 1 strip (has no administration in time range)  proparacaine (ALCAINE) 0.5 %  ophthalmic solution 1 drop (has no administration in time range)     Initial Impression / Assessment and Plan / ED Course  I have reviewed the triage vital signs and the nursing notes.  Pertinent labs & imaging results that were available during my care of the patient were reviewed by me and considered in my medical decision making (see chart for details).     Patient here with complaint of spread of herpes zoster toward the left eye. Diagnosed 8/25 with trigeminal zoster. She initially felt her vision in the left eye was cloudy but reports any visual change has resolved.   She reports feeling generally poor, achy, fatigued.   Will start on Viroptic eye drops and refer to ophthalmology for further/repeat eye exam for ophthalmologic involvement of zoster.   Final Clinical Impressions(s) / ED Diagnoses   Final diagnoses:  None   1. Herpes zoster 2. Left eye pain  ED Discharge Orders    None       Charlann Lange, Hershal Coria 83/29/19 1660    Delora Fuel, MD 60/04/59 [phone]

## 2017-12-25 ENCOUNTER — Other Ambulatory Visit: Payer: Self-pay | Admitting: Sports Medicine

## 2017-12-25 DIAGNOSIS — F329 Major depressive disorder, single episode, unspecified: Secondary | ICD-10-CM

## 2017-12-25 DIAGNOSIS — F419 Anxiety disorder, unspecified: Principal | ICD-10-CM

## 2017-12-25 DIAGNOSIS — F332 Major depressive disorder, recurrent severe without psychotic features: Secondary | ICD-10-CM

## 2017-12-26 ENCOUNTER — Other Ambulatory Visit: Payer: Self-pay | Admitting: Sports Medicine

## 2017-12-26 DIAGNOSIS — G43009 Migraine without aura, not intractable, without status migrainosus: Secondary | ICD-10-CM

## 2017-12-31 ENCOUNTER — Encounter: Payer: Self-pay | Admitting: Sports Medicine

## 2017-12-31 ENCOUNTER — Ambulatory Visit (INDEPENDENT_AMBULATORY_CARE_PROVIDER_SITE_OTHER): Payer: 59 | Admitting: Sports Medicine

## 2017-12-31 DIAGNOSIS — B029 Zoster without complications: Secondary | ICD-10-CM | POA: Diagnosis not present

## 2017-12-31 MED ORDER — RIZATRIPTAN BENZOATE 5 MG PO TBDP: 5.0000 mg | ORAL_TABLET | ORAL | 2 refills | Status: DC | PRN

## 2017-12-31 NOTE — Progress Notes (Signed)
Subjective:    CC: Follow-up  HPI: Several days ago this pleasant 42 year old female developed a rash on her left upper forehead, involvement of the eyelid but not of the eye, no change in the vision.  She was seen in the emergency department and then by the ophthalmologist, ultimate diagnosis was V1 shingles, she was treated with high-dose Valtrex, continue gabapentin, she has been seen by the ophthalmologist, no corneal involvement.  Overall doing better.  I reviewed the past medical history, family history, social history, surgical history, and allergies today and no changes were needed.  Please see the problem list section below in epic for further details.  Past Medical History: Past Medical History:  Diagnosis Date  . Depression   . Depression with anxiety   . Migraine   . Morbid obesity ([place])   . Panic disorder   . Seizures ([place])    Past Surgical History: Past Surgical History:  Procedure Laterality Date  . 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 DILATION AND CURETTAGE, DIAGNOSTIC / THERAPEUTIC    . SOFT TISSUE TUMOR RESECTION  06/17/07   chest/ wound reclosure 4/09'   Social History: Social History   Socioeconomic History  . Marital status: Married    Spouse name: Not on file  . Number of children: Not on file  . Years of education: Not on file  . Highest education level: Not on file  Occupational History  . Not on file  Social Needs  . Financial resource strain: Not on file  . Food insecurity:    Worry: Not on file    Inability: Not on file  . Transportation needs:    Medical: Not on file    Non-medical: Not on file  Tobacco Use  . Smoking status: Never Smoker  . Smokeless tobacco: Never Used  Substance and Sexual Activity  . Alcohol use: No  . Drug use: No  . Sexual activity: Yes    Birth control/protection: Condom  Lifestyle  . Physical activity:    Days per week: Not on file    Minutes per session: Not on file  . Stress: Not on file    Relationships  . Social connections:    Talks on phone: Not on file    Gets together: Not on file    Attends religious service: Not on file    Active member of club or organization: Not on file    Attends meetings of clubs or organizations: Not on file    Relationship status: Not on file  Other Topics Concern  . Not on file  Social History Narrative  . Not on file   Family History: Family History  Problem Relation Age of Onset  . Hypertension Mother   . Migraines Mother   . Depression Father   . Suicidality Father    Allergies: Allergies  Allergen Reactions  . Codeine Itching  . Sumatriptan     Palpitations   Medications: See med rec.  Review of Systems: No fevers, chills, night sweats, weight loss, chest pain, or shortness of breath.   Objective:    General: Well Developed, well nourished, and in no acute distress.  Neuro: Alert and oriented x3, extra-ocular muscles intact, sensation grossly intact.  HEENT: Normocephalic, atraumatic, pupils equal round reactive to light, neck supple, no masses, no lymphadenopathy, thyroid nonpalpable.  Skin: Warm and dry, there is some scabbing of what appears to be herpetic rash on the left upper forehead, no involvement of the eyelid. Cardiac:  Regular rate and rhythm, no murmurs rubs or gallops, no lower extremity edema.  Respiratory: Clear to auscultation bilaterally. Not using accessory muscles, speaking in full sentences.  Impression and Recommendations:    Shingles Left V1 distribution shingles, currently on high-dose Valtrex, gabapentin, symptoms are under control and improving. Has seen the ophthalmologist twice, no evidence of corneal involvement, normal floor seen exam in the ED. I do not think there is any reason to change her plan. Do a full 2-week course of Valtrex, continue gabapentin return to see me in 2 weeks.  I spent 25 minutes with this patient, greater than 50% was face-to-face time counseling regarding the  above diagnoses ___________________________________________ Gwen Her. Dianah Field, M.D., ABFM., CAQSM. Primary Care and [place] Instructor of [employer] [place]

## 2017-12-31 NOTE — Assessment & Plan Note (Signed)
Left V1 distribution shingles, currently on high-dose Valtrex, gabapentin, symptoms are under control and improving. Has seen the ophthalmologist twice, no evidence of corneal involvement, normal floor seen exam in the ED. I do not think there is any reason to change her plan. Do a full 2-week course of Valtrex, continue gabapentin return to see me in 2 weeks.

## 2018-01-13 ENCOUNTER — Ambulatory Visit: Payer: Self-pay | Admitting: Sports Medicine

## 2018-01-16 ENCOUNTER — Encounter: Payer: Self-pay | Admitting: Sports Medicine

## 2018-01-17 MED ORDER — BENZONATATE 200 MG PO CAPS: 200.0000 mg | ORAL_CAPSULE | Freq: Three times a day (TID) | ORAL | 0 refills | Status: DC | PRN

## 2018-02-03 ENCOUNTER — Ambulatory Visit: Payer: 59 | Admitting: Sports Medicine

## 2018-03-22 ENCOUNTER — Other Ambulatory Visit: Payer: Self-pay | Admitting: Sports Medicine

## 2018-03-22 DIAGNOSIS — F332 Major depressive disorder, recurrent severe without psychotic features: Secondary | ICD-10-CM

## 2018-03-22 DIAGNOSIS — F329 Major depressive disorder, single episode, unspecified: Secondary | ICD-10-CM

## 2018-03-22 DIAGNOSIS — F32A Depression, unspecified: Secondary | ICD-10-CM

## 2018-03-22 DIAGNOSIS — F419 Anxiety disorder, unspecified: Principal | ICD-10-CM

## 2018-03-25 ENCOUNTER — Other Ambulatory Visit: Payer: Self-pay | Admitting: Sports Medicine

## 2018-04-10 ENCOUNTER — Other Ambulatory Visit: Payer: Self-pay | Admitting: Sports Medicine

## 2018-05-19 ENCOUNTER — Encounter: Payer: Self-pay | Admitting: Sports Medicine

## 2018-05-20 MED ORDER — GABAPENTIN 300 MG PO CAPS: ORAL_CAPSULE | ORAL | 3 refills | Status: DC

## 2018-05-29 ENCOUNTER — Ambulatory Visit: Payer: 59 | Admitting: Sports Medicine

## 2018-06-02 ENCOUNTER — Encounter: Payer: Self-pay | Admitting: *Deleted

## 2018-06-02 ENCOUNTER — Emergency Department (INDEPENDENT_AMBULATORY_CARE_PROVIDER_SITE_OTHER)
Admission: EM | Admit: 2018-06-02 | Discharge: 2018-06-02 | Disposition: A | Discharge status: Home / Self Care | Payer: 59 | Source: Home / Self Care

## 2018-06-02 ENCOUNTER — Emergency Department (INDEPENDENT_AMBULATORY_CARE_PROVIDER_SITE_OTHER): Payer: 59

## 2018-06-02 DIAGNOSIS — R112 Nausea with vomiting, unspecified: Secondary | ICD-10-CM | POA: Diagnosis not present

## 2018-06-02 DIAGNOSIS — R05 Cough: Secondary | ICD-10-CM | POA: Diagnosis not present

## 2018-06-02 DIAGNOSIS — R0989 Other specified symptoms and signs involving the circulatory and respiratory systems: Secondary | ICD-10-CM | POA: Diagnosis not present

## 2018-06-02 DIAGNOSIS — R6889 Other general symptoms and signs: Secondary | ICD-10-CM

## 2018-06-02 DIAGNOSIS — R42 Dizziness and giddiness: Secondary | ICD-10-CM | POA: Diagnosis not present

## 2018-06-02 DIAGNOSIS — R509 Fever, unspecified: Secondary | ICD-10-CM | POA: Diagnosis not present

## 2018-06-02 DIAGNOSIS — I951 Orthostatic hypotension: Secondary | ICD-10-CM

## 2018-06-02 LAB — POCT CBC W AUTO DIFF (K'VILLE URGENT CARE)

## 2018-06-02 LAB — POCT FASTING CBG KUC MANUAL ENTRY: POCT Glucose (KUC): 122 mg/dL — AB (ref 70–99)

## 2018-06-02 MED ORDER — ONDANSETRON 4 MG PO TBDP: ORAL_TABLET | ORAL | 0 refills | Status: DC

## 2018-06-02 NOTE — Discharge Instructions (Signed)
°  Be sure to get a lot of rest and stay well hydrated with sports drinks (Gatorade or Pedialyte), diluted juices, and clear sodas.  Avoid fried fatty food, spicy food, and milk as these foods can cause worsening stomach upset.   Please follow up with family medicine in 3-4 days if not improving, sooner if worsening.  Call 911 or go to emergency department if symptoms significantly worsening.

## 2018-06-02 NOTE — ED Triage Notes (Signed)
Productive cough- yellow, intermittent fever, body aches, N/V, since last Wednesday. Last night onset of postural dizziness that is worse today.

## 2018-06-02 NOTE — ED Provider Notes (Signed)
[facility]    CSN: [id number] Arrival date & time: 06/02/18  1255     History   Chief Complaint Chief Complaint  Patient presents with  . Cough  . Dizziness  . Emesis  . Fever  . Nausea    HPI SUZY KUGEL is a 43 y.o. female.   HPI  AMILEE JANVIER is a 43 y.o. female presenting to UC with c/o 6 days of productive cough with yellow sputum, intermittent subjective fever, body aches, nausea and vomiting. Today she is c/o dizziness, worse with change in position. She vomited 4 times yesterday. Mild nausea still present. She has only drank water and eaten a few Girl Scout Cookies today due to lack of appetite. No known sick contacts but she does work at an [employer].   Past Medical History:  Diagnosis Date  . Depression   . Depression with anxiety   . Migraine   . Morbid obesity ([place])   . Panic disorder   . Seizures [facility])     Patient Active Problem List   Diagnosis Date Noted  . Shingles 12/31/2017  . Major depressive disorder, recurrent episode, moderate ([place]) 11/14/2015  . 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 Severe episode of recurrent major depressive disorder, without psychotic features ([place])   . MDD (major depressive disorder), recurrent episode, severe ([place]) 10/20/2015  . Opioid use disorder, moderate, dependence ([place]) 10/20/2015  . Chronic pain syndrome 10/20/2015  . Low back pain 07/22/2015  . Excessive daytime sleepiness 10/28/2014  . Left cervical radiculopathy 04/02/2014  . Insomnia 12/01/2013  . Migraine without aura 08/04/2013  . Anxiety and depression 03/25/2013  . Hyperlipidemia 03/11/2012  . Annual physical exam 03/10/2012  . Obesity 03/10/2012  . TERATOMA 03/07/2010    Past Surgical History:  Procedure Laterality Date  . 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 DILATION AND CURETTAGE, DIAGNOSTIC / THERAPEUTIC    . SOFT TISSUE TUMOR RESECTION  06/17/07   chest/ wound reclosure 4/09'    OB History    Gravida  5   Para  3   Term    3   Preterm      AB  0   Living  3     SAB      TAB      Ectopic      Multiple      Live Births               Home Medications    Prior to Admission medications   Medication Sig Start Date End Date Taking? Authorizing Provider  gabapentin (NEURONTIN) 300 MG capsule One tab PO qHS for a week, then BID for a week, then TID. May double weekly to a max of 3,600mg /day 05/20/18  Yes Silverio Decamp, MD  hydrOXYzine (ATARAX/VISTARIL) 50 MG tablet TAKE ONE TABLET BY MOUTH AT BEDTIME 03/25/18  Yes Silverio Decamp, MD  TROKENDI XR 50 MG CP24 TAKE 1 CAPSULE BY MOUTH EVERY DAY 12/26/17  Yes Silverio Decamp, MD  venlafaxine XR (EFFEXOR-XR) 150 MG 24 hr capsule TAKE 1 CAPSULE (150 MG TOTAL) BY MOUTH DAILY WITH BREAKFAST. 03/22/18  Yes Silverio Decamp, MD  benzonatate (TESSALON) 200 MG capsule Take 1 capsule (200 mg total) by mouth 3 (three) times daily as needed for cough. 01/17/18   Silverio Decamp, MD  ondansetron (ZOFRAN ODT) 4 MG disintegrating tablet 4mg  ODT q4 hours prn nausea/vomit 06/02/18   Noe Gens, PA-C  rizatriptan (MAXALT-MLT)  5 MG disintegrating tablet Take 1 tablet (5 mg total) by mouth as needed for migraine. May repeat in 2 hours if needed 12/31/17   Silverio Decamp, MD    Family History Family History  Problem Relation Age of Onset  . Hypertension Mother   . Migraines Mother   . Depression Father   . Suicidality Father     Social History Social History   Tobacco Use  . Smoking status: Never Smoker  . Smokeless tobacco: Never Used  Substance Use Topics  . Alcohol use: No  . Drug use: No     Allergies   Codeine and Sumatriptan   Review of Systems Review of Systems  Constitutional: Positive for appetite change, fatigue and fever. Negative for chills.  HENT: Positive for congestion. Negative for ear pain, sore throat, trouble swallowing and voice change.   Respiratory: Positive for cough. Negative for  shortness of breath.   Cardiovascular: Negative for chest pain and palpitations.  Gastrointestinal: Positive for nausea and vomiting. Negative for abdominal pain and diarrhea.  Musculoskeletal: Negative for arthralgias, back pain and myalgias.  Skin: Negative for rash.  Neurological: Positive for dizziness, light-headedness and headaches. Negative for syncope.     Physical Exam Triage Vital Signs ED Triage Vitals  Enc Vitals Group     BP      Pulse      Resp      Temp      Temp src      SpO2      Weight      Height      Head Circumference      Peak Flow      Pain Score      Pain Loc      Pain Edu?      Excl. in [place]?    Orthostatic VS for the past 24 hrs:  BP- Lying Pulse- Lying BP- Sitting Pulse- Sitting BP- Standing at 0 minutes Pulse- Standing at 0 minutes  06/02/18 1423 150/85 90 125/84 103 135/84 103    Updated Vital Signs BP 128/83 (BP Location: Right Arm)   Pulse (!) 113   Temp 98 F (36.7 C) (Oral)   Resp 16   Ht 5\' 3"  (1.6 m)   Wt 280 lb (127 kg)   SpO2 95%   BMI 49.60 kg/m   Visual Acuity Right Eye Distance:   Left Eye Distance:   Bilateral Distance:    Right Eye Near:   Left Eye Near:    Bilateral Near:     Physical Exam Vitals signs and nursing note reviewed.  Constitutional:      Appearance: Normal appearance. She is well-developed.  HENT:     Head: Normocephalic and atraumatic.     Right Ear: Tympanic membrane normal.     Left Ear: Tympanic membrane normal.     Nose: Nose normal.     Right Sinus: No maxillary sinus tenderness or frontal sinus tenderness.     Left Sinus: No maxillary sinus tenderness or frontal sinus tenderness.     Mouth/Throat:     Lips: Pink.     Mouth: Mucous membranes are moist.     Pharynx: Oropharynx is clear. Uvula midline.  Neck:     Musculoskeletal: Normal range of motion.  Cardiovascular:     Rate and Rhythm: Regular rhythm. Tachycardia present.     Comments: Mild tachycardia Pulmonary:     Effort:  Pulmonary effort is normal. No respiratory distress.  Breath sounds: Normal breath sounds. No stridor. No wheezing or rhonchi.  Musculoskeletal: Normal range of motion.  Skin:    General: Skin is warm and dry.  Neurological:     Mental Status: She is alert and oriented to person, place, and time.  Psychiatric:        Behavior: Behavior normal.      UC Treatments / Results  Labs (all labs ordered are listed, but only abnormal results are displayed) Labs Reviewed  POCT FASTING CBG [facility] MANUAL ENTRY - Abnormal; Notable for the following components:      Result Value   POCT Glucose ([facility]) 122 (*)    All other components within normal limits  COMPLETE METABOLIC PANEL WITH GFR  POCT CBC W AUTO DIFF ([facility])    EKG None  Radiology Dg Chest 2 View  Result Date: 06/02/2018 CLINICAL DATA:  Cough and congestion.  Fever, nausea and vomiting. EXAM: CHEST - 2 VIEW COMPARISON:  07/23/2017 FINDINGS: Previous median sternotomy and CABG procedure. The heart size and mediastinal contours are within normal limits. Both lungs are clear. The visualized skeletal structures are unremarkable. IMPRESSION: No active cardiopulmonary disease. Electronically Signed   By: Kerby Moors M.D.   On: 06/02/2018 14:07    Procedures Procedures (including critical care time)  Medications Ordered in UC Medications - No data to display  Initial Impression / Assessment and Plan / UC Course  I have reviewed the triage vital signs and the nursing notes.  Pertinent labs & imaging results that were available during my care of the patient were reviewed by me and considered in my medical decision making (see chart for details).    Hx and exam c/w mild dehydration. Will order CXR to r/o pneumonia CBC: unremarkable Blood glucose: 122, pt notes she did eat several Girl Scout Cookies about 1 hour PTA.  Symptoms possibly due to viral gastroenteritis vs influenza. Pt well outside recommended 48 hour  treatment window for Tamiflu.  Home care info provided. Pt able to keep down PO fluids, encouraged to replenish electrolytes to help dizziness.  Work not provided for today and tomorrow.  Final Clinical Impressions(s) / UC Diagnoses   Final diagnoses:  Dizziness  Nausea and vomiting in adult  Orthostatic hypotension  Flu-like symptoms     Discharge Instructions      Be sure to get a lot of rest and stay well hydrated with sports drinks (Gatorade or Pedialyte), diluted juices, and clear sodas.  Avoid fried fatty food, spicy food, and milk as these foods can cause worsening stomach upset.   Please follow up with family medicine in 3-4 days if not improving, sooner if worsening.  Call 911 or go to emergency department if symptoms significantly worsening.     ED Prescriptions    Medication Sig Dispense Auth. Provider   ondansetron (ZOFRAN ODT) 4 MG disintegrating tablet 4mg  ODT q4 hours prn nausea/vomit 8 tablet Noe Gens, PA-C     Controlled Substance Prescriptions [place] Controlled Substance Registry consulted? Not Applicable   Tyrell Antonio 06/02/18 1433

## 2018-06-03 ENCOUNTER — Telehealth: Payer: Self-pay

## 2018-06-03 LAB — COMPLETE METABOLIC PANEL WITH GFR
AG Ratio: 1.4 (calc) (ref 1.0–2.5)
ALT: 16 U/L (ref 6–29)
AST: 16 U/L (ref 10–30)
Albumin: 4.1 g/dL (ref 3.6–5.1)
Alkaline phosphatase (APISO): 123 U/L (ref 31–125)
BUN: 14 mg/dL (ref 7–25)
CO2: 23 mmol/L (ref 20–32)
Calcium: 8.9 mg/dL (ref 8.6–10.2)
Chloride: 104 mmol/L (ref 98–110)
Creat: 0.71 mg/dL (ref 0.50–1.10)
GFR, Est African American: 123 mL/min/{1.73_m2} (ref >=60)
GFR, Est Non African American: 106 mL/min/{1.73_m2} (ref >=60)
Globulin: 2.9 g/dL (calc) (ref 1.9–3.7)
Glucose, Bld: 118 mg/dL — ABNORMAL HIGH (ref 65–99)
Potassium: 3.7 mmol/L (ref 3.5–5.3)
Sodium: 139 mmol/L (ref 135–146)
Total Bilirubin: 0.2 mg/dL (ref 0.2–1.2)
Total Protein: 7 g/dL (ref 6.1–8.1)

## 2018-06-03 NOTE — Telephone Encounter (Signed)
Left voice message inquiring about patients status. Given nml lab results. Encouraged patient to call with questions or concerns.

## 2018-06-05 ENCOUNTER — Encounter: Payer: Self-pay | Admitting: Sports Medicine

## 2018-06-12 MED ORDER — ONDANSETRON HCL 4 MG/2ML IJ SOLN
INTRAMUSCULAR | Status: AC
  Filled 2018-06-12: qty 2

## 2018-06-12 MED ORDER — PROPOFOL 10 MG/ML IV BOLUS
INTRAVENOUS | Status: AC
  Filled 2018-06-12: qty 20

## 2018-06-12 MED ORDER — VECURONIUM BROMIDE 10 MG IV SOLR
INTRAVENOUS | Status: AC
  Filled 2018-06-12: qty 10

## 2018-06-12 MED ORDER — PHENYLEPHRINE HCL 10 MG/ML IJ SOLN
INTRAMUSCULAR | Status: AC
  Filled 2018-06-12: qty 1

## 2018-06-12 MED ORDER — MIDAZOLAM HCL 2 MG/2ML IJ SOLN
INTRAMUSCULAR | Status: AC
  Filled 2018-06-12: qty 2

## 2018-06-12 MED ORDER — FENTANYL CITRATE (PF) 250 MCG/5ML IJ SOLN
INTRAMUSCULAR | Status: AC
  Filled 2018-06-12: qty 5

## 2018-06-12 MED ORDER — ROCURONIUM BROMIDE 50 MG/5ML IV SOSY
PREFILLED_SYRINGE | INTRAVENOUS | Status: AC
  Filled 2018-06-12: qty 5

## 2018-06-16 ENCOUNTER — Ambulatory Visit: Payer: 59 | Admitting: Sports Medicine

## 2018-06-22 ENCOUNTER — Other Ambulatory Visit: Payer: Self-pay | Admitting: Sports Medicine

## 2018-07-18 ENCOUNTER — Encounter: Payer: Self-pay | Admitting: Sports Medicine

## 2018-07-25 ENCOUNTER — Other Ambulatory Visit: Payer: Self-pay | Admitting: Sports Medicine

## 2018-07-25 DIAGNOSIS — F419 Anxiety disorder, unspecified: Principal | ICD-10-CM

## 2018-07-25 DIAGNOSIS — F332 Major depressive disorder, recurrent severe without psychotic features: Secondary | ICD-10-CM

## 2018-07-25 DIAGNOSIS — F329 Major depressive disorder, single episode, unspecified: Secondary | ICD-10-CM

## 2018-07-28 ENCOUNTER — Ambulatory Visit: Payer: 59 | Admitting: Sports Medicine

## 2018-07-31 ENCOUNTER — Telehealth: Payer: 59 | Admitting: Physician Assistant

## 2018-07-31 DIAGNOSIS — B349 Viral infection, unspecified: Secondary | ICD-10-CM

## 2018-07-31 NOTE — Progress Notes (Signed)
I have spent 5 minutes in review of e-visit questionnaire, review and updating patient chart, medical decision making and response to patient.   Arrin Pintor Cody Nikya Busler, PA-C    

## 2018-07-31 NOTE — Progress Notes (Signed)
E-Visit for Corona Virus Screening  Based on your current symptoms, you may very well have the virus, however your symptoms are mild. Currently, not all patients are being tested. If the symptoms are mild and there is not a known exposure, performing the test is not indicated.  Coronavirus disease 2019 (COVID-19) is a respiratory illness that can spread from person to person. The virus that causes COVID-19 is a new virus that was first identified in the country of [place] but is now found in multiple other countries and has spread to the [place].  Symptoms associated with the virus are mild to severe fever, cough, and shortness of breath. There is currently no vaccine to protect against COVID-19, and there is no specific antiviral treatment for the virus.   To be considered HIGH RISK for Coronavirus (COVID-19), you have to meet the following criteria:  . Traveled to [place], [place], [place], [place] or [place]; or in the [place] to [place], [place], [place], or [place]; and have fever, cough, and shortness of breath within the last 2 weeks of travel OR  . Been in close contact with a person diagnosed with COVID-19 within the last 2 weeks and have fever, cough, and shortness of breath  . IF YOU DO NOT MEET THESE CRITERIA, YOU ARE CONSIDERED LOW RISK FOR COVID-19.   It is vitally important that if you feel that you have an infection such as this virus or any other virus that you stay home and away from places where you may spread it to others.  You should self-quarantine for 14 days if you have symptoms that could potentially be coronavirus and avoid contact with people age 42 and older.   You can use medication such as Delsym for cough. You may also take acetaminophen (Tylenol) as needed for fever.   Reduce your risk of any infection by using the same precautions used for avoiding the common cold or flu:  Marland Kitchen Wash your hands often with soap and warm water for at least 20 seconds.  If  soap and water are not readily available, use an alcohol-based hand sanitizer with at least 60% alcohol.  . If coughing or sneezing, cover your mouth and nose by coughing or sneezing into the elbow areas of your shirt or coat, into a tissue or into your sleeve (not your hands). . Avoid shaking hands with others and consider head nods or verbal greetings only. . Avoid touching your eyes, nose, or mouth with unwashed hands.  . Avoid close contact with people who are sick. . Avoid places or events with large numbers of people in one location, like concerts or sporting events. . Carefully consider travel plans you have or are making. . If you are planning any travel outside or inside the [place], visit the CDC's Travelers' Health webpage for the latest health notices. . If you have some symptoms but not all symptoms, continue to monitor at home and seek medical attention if your symptoms worsen. . If you are having a medical emergency, call 911.  HOME CARE . Only take medications as instructed by your medical team. . Drink plenty of fluids and get plenty of rest. . A steam or ultrasonic humidifier can help if you have congestion.   GET HELP RIGHT AWAY IF: . You develop worsening fever. . You become short of breath . You cough up blood. . Your symptoms become more severe MAKE SURE YOU   Understand these instructions.  Will watch  your condition.  Will get help right away if you are not doing well or get worse.  Your e-visit answers were reviewed by a board certified advanced clinical practitioner to complete your personal care plan.  Depending on the condition, your plan could have included both over the counter or prescription medications.  If there is a problem please reply once you have received a response from your provider. Your safety is important to [place].  If you have drug allergies check your prescription carefully.    You can use MyChart to ask questions about today's visit, request a  non-urgent call back, or ask for a work or school excuse for 24 hours related to this e-Visit. If it has been greater than 24 hours you will need to follow up with your provider, or enter a new e-Visit to address those concerns. You will get an e-mail in the next two days asking about your experience.  I hope that your e-visit has been valuable and will speed your recovery. Thank you for using e-visits.

## 2018-08-04 ENCOUNTER — Encounter: Payer: Self-pay | Admitting: Sports Medicine

## 2018-08-05 NOTE — Telephone Encounter (Signed)
Visit over 6 months ago, please schedule WebEx visit

## 2018-08-21 ENCOUNTER — Telehealth: Payer: 59 | Admitting: Physician Assistant

## 2018-08-21 DIAGNOSIS — R6889 Other general symptoms and signs: Principal | ICD-10-CM

## 2018-08-21 DIAGNOSIS — Z20822 Contact with and (suspected) exposure to covid-19: Secondary | ICD-10-CM

## 2018-08-21 MED ORDER — BENZONATATE 100 MG PO CAPS: 100.0000 mg | ORAL_CAPSULE | Freq: Three times a day (TID) | ORAL | 0 refills | Status: DC | PRN

## 2018-08-21 NOTE — Progress Notes (Signed)
I have spent 5 minutes in review of e-visit questionnaire, review and updating patient chart, medical decision making and response to patient.   Hannahgrace Lalli Cody Deke Tilghman, PA-C    

## 2018-08-21 NOTE — Progress Notes (Signed)
E-Visit for Corona Virus Screening  Based on your current symptoms, you may very well have the virus, however your symptoms are mild. Currently, not all patients are being tested. If the symptoms are mild and there is not a known exposure, performing the test is not indicated.  Coronavirus disease 2019 (COVID-19) is a respiratory illness that can spread from person to person. The virus that causes COVID-19 is a new virus that was first identified in the country of [place] but is now found in multiple other countries and has spread to the [place].  Symptoms associated with the virus are mild to severe fever, cough, and shortness of breath. There is currently no vaccine to protect against COVID-19, and there is no specific antiviral treatment for the virus.   To be considered HIGH RISK for Coronavirus (COVID-19), you have to meet the following criteria:  . Traveled to [place], [place], [place], [place] or [place]; or in the [place] to [place], [place], [place], or [place]; and have fever, cough, and shortness of breath within the last 2 weeks of travel OR  . Been in close contact with a person diagnosed with COVID-19 within the last 2 weeks and have fever, cough, and shortness of breath  . IF YOU DO NOT MEET THESE CRITERIA, YOU ARE CONSIDERED LOW RISK FOR COVID-19.   It is vitally important that if you feel that you have an infection such as this virus or any other virus that you stay home and away from places where you may spread it to others.  You should self-quarantine for 14 days if you have symptoms that could potentially be coronavirus and avoid contact with people age 97 and older.   You can use medication such as A prescription cough medication called Tessalon Perles 100 mg. You may take 1-2 capsules every 8 hours as needed for cough ( A prescription has been sent in)  You may also take acetaminophen (Tylenol) as needed for fever.   Reduce your risk of any infection by using  the same precautions used for avoiding the common cold or flu:  Marland Kitchen Wash your hands often with soap and warm water for at least 20 seconds.  If soap and water are not readily available, use an alcohol-based hand sanitizer with at least 60% alcohol.  . If coughing or sneezing, cover your mouth and nose by coughing or sneezing into the elbow areas of your shirt or coat, into a tissue or into your sleeve (not your hands). . Avoid shaking hands with others and consider head nods or verbal greetings only. . Avoid touching your eyes, nose, or mouth with unwashed hands.  . Avoid close contact with people who are sick. . Avoid places or events with large numbers of people in one location, like concerts or sporting events. . Carefully consider travel plans you have or are making. . If you are planning any travel outside or inside the [place], visit the CDC's Travelers' Health webpage for the latest health notices. . If you have some symptoms but not all symptoms, continue to monitor at home and seek medical attention if your symptoms worsen. . If you are having a medical emergency, call 911.  HOME CARE . Only take medications as instructed by your medical team. . Drink plenty of fluids and get plenty of rest. . A steam or ultrasonic humidifier can help if you have congestion.   GET HELP RIGHT AWAY IF: . You develop worsening fever. . You  become short of breath . You cough up blood. . Your symptoms become more severe MAKE SURE YOU   Understand these instructions.  Will watch your condition.  Will get help right away if you are not doing well or get worse.  Your e-visit answers were reviewed by a board certified advanced clinical practitioner to complete your personal care plan.  Depending on the condition, your plan could have included both over the counter or prescription medications.  If there is a problem please reply once you have received a response from your provider. Your safety is important to  [place].  If you have drug allergies check your prescription carefully.    You can use MyChart to ask questions about today's visit, request a non-urgent call back, or ask for a work or school excuse for 24 hours related to this e-Visit. If it has been greater than 24 hours you will need to follow up with your provider, or enter a new e-Visit to address those concerns. You will get an e-mail in the next two days asking about your experience.  I hope that your e-visit has been valuable and will speed your recovery. Thank you for using e-visits.

## 2018-09-23 ENCOUNTER — Other Ambulatory Visit: Payer: Self-pay | Admitting: Sports Medicine

## 2018-09-29 ENCOUNTER — Encounter: Payer: Self-pay | Admitting: Sports Medicine

## 2018-09-29 ENCOUNTER — Ambulatory Visit (INDEPENDENT_AMBULATORY_CARE_PROVIDER_SITE_OTHER): Payer: 59 | Admitting: Sports Medicine

## 2018-09-29 DIAGNOSIS — Z6841 Body Mass Index (BMI) 40.0 and over, adult: Secondary | ICD-10-CM

## 2018-09-29 DIAGNOSIS — E785 Hyperlipidemia, unspecified: Secondary | ICD-10-CM

## 2018-09-29 DIAGNOSIS — K219 Gastro-esophageal reflux disease without esophagitis: Secondary | ICD-10-CM | POA: Diagnosis not present

## 2018-09-29 DIAGNOSIS — D509 Iron deficiency anemia, unspecified: Secondary | ICD-10-CM

## 2018-09-29 DIAGNOSIS — F332 Major depressive disorder, recurrent severe without psychotic features: Secondary | ICD-10-CM

## 2018-09-29 DIAGNOSIS — L918 Other hypertrophic disorders of the skin: Secondary | ICD-10-CM | POA: Diagnosis not present

## 2018-09-29 MED ORDER — VENLAFAXINE HCL ER 75 MG PO CP24: 225.0000 mg | ORAL_CAPSULE | Freq: Every day | ORAL | 3 refills | Status: DC

## 2018-09-29 MED ORDER — PANTOPRAZOLE SODIUM 40 MG PO TBEC: 40.0000 mg | DELAYED_RELEASE_TABLET | Freq: Every day | ORAL | 3 refills | Status: DC

## 2018-09-29 MED ORDER — HYDROXYZINE HCL 50 MG PO TABS: 50.0000 mg | ORAL_TABLET | Freq: Every day | ORAL | 3 refills | Status: DC

## 2018-09-29 NOTE — Assessment & Plan Note (Signed)
Referral for gastric sleeve. This would likely cure her back pain, other medical problems, and GERD.

## 2018-09-29 NOTE — Assessment & Plan Note (Addendum)
Without symptoms of hemorrhage. Adding pantoprazole.  There is moderate anemia, microcytic, adding an anemia panel, so add several labs to blood already in the lab, considering her symptoms I do think she needs to see gastroenterology, referral placed.

## 2018-09-29 NOTE — Assessment & Plan Note (Signed)
Worsening anxiety, depression is well controlled, increasing Effexor to 225 mg for short-term, referral to [employer]. Return to see me in 1 month for this.

## 2018-09-29 NOTE — Progress Notes (Addendum)
Subjective:    CC: Several issues  HPI: Anxiety and depression: Historically well controlled with Effexor 150, she is not in behavioral therapy, with the current news headlines she is having increased anxiety, not much depression, no suicidal or homicidal ideation.  Obesity: Agreeable to discuss bariatric surgery.  Skin tag: Back of the neck, irritates her.  GERD: Coughs with eating, occasional sour brash, minimal heartburn.  No melena, hematochezia, hematemesis.  I reviewed the past medical history, family history, social history, surgical history, and allergies today and no changes were needed.  Please see the problem list section below in epic for further details.  Past Medical History: Past Medical History:  Diagnosis Date  . Depression   . Depression with anxiety   . Migraine   . Morbid obesity ([place])   . Panic disorder   . Seizures ([place])    Past Surgical History: Past Surgical History:  Procedure Laterality Date  . 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 DILATION AND CURETTAGE, DIAGNOSTIC / THERAPEUTIC    . SOFT TISSUE TUMOR RESECTION  06/17/07   chest/ wound reclosure 4/09'   Social History: Social History   Socioeconomic History  . Marital status: Married    Spouse name: Not on file  . Number of children: Not on file  . Years of education: Not on file  . Highest education level: Not on file  Occupational History  . Not on file  Social Needs  . Financial resource strain: Not on file  . Food insecurity    Worry: Not on file    Inability: Not on file  . Transportation needs    Medical: Not on file    Non-medical: Not on file  Tobacco Use  . Smoking status: Never Smoker  . Smokeless tobacco: Never Used  Substance and Sexual Activity  . Alcohol use: No  . Drug use: No  . Sexual activity: Yes    Birth control/protection: Condom  Lifestyle  . Physical activity    Days per week: Not on file    Minutes per session: Not on file  . Stress: Not on file   Relationships  . Social Herbalist on phone: Not on file    Gets together: Not on file    Attends religious service: Not on file    Active member of club or organization: Not on file    Attends meetings of clubs or organizations: Not on file    Relationship status: Not on file  Other Topics Concern  . Not on file  Social History Narrative  . Not on file   Family History: Family History  Problem Relation Age of Onset  . Hypertension Mother   . Migraines Mother   . Depression Father   . Suicidality Father    Allergies: Allergies  Allergen Reactions  . Codeine Itching  . Sumatriptan     Palpitations   Medications: See med rec.  Review of Systems: No fevers, chills, night sweats, weight loss, chest pain, or shortness of breath.   Objective:    General: Well Developed, well nourished, and in no acute distress.  Neuro: Alert and oriented x3, extra-ocular muscles intact, sensation grossly intact.  HEENT: Normocephalic, atraumatic, pupils equal round reactive to light, neck supple, no masses, no lymphadenopathy, thyroid nonpalpable.  Skin: Warm and dry, no rashes. Cardiac: Regular rate and rhythm, no murmurs rubs or gallops, no lower extremity edema.  Respiratory: Clear to auscultation bilaterally. Not using accessory muscles, speaking  in full sentences.  Procedure:  Cryodestruction of skin tag on the back of the neck Consent obtained and verified. Time-out conducted. Noted no overlying erythema, induration, or other signs of local infection. Completed without difficulty using Cryo-Gun. Advised to call if fevers/chills, erythema, induration, drainage, or persistent bleeding.  Impression and Recommendations:    Skin tag Posterior neck, cryotherapy as above.  Hyperlipidemia Checking routine labs, is been 2 years.  Obesity Referral for gastric sleeve. This would likely cure her back pain, other medical problems, and GERD.   GERD (gastroesophageal reflux  disease) Without symptoms of hemorrhage. Adding pantoprazole.  There is moderate anemia, microcytic, adding an anemia panel, so add several labs to blood already in the lab, considering her symptoms I do think she needs to see gastroenterology, referral placed.  Severe episode of recurrent major depressive disorder, without psychotic features ([place]) Worsening anxiety, depression is well controlled, increasing Effexor to 225 mg for short-term, referral to [employer]. Return to see me in 1 month for this.  Microcytic anemia There is moderate anemia, microcytic, adding an anemia panel, so add several labs to blood already in the lab, considering her symptoms I do think she needs to see gastroenterology, referral placed.   ___________________________________________ Gwen Her. Dianah Field, M.D., ABFM., CAQSM. Primary Care and Sports Medicine [place] [facility]  Adjunct Professor of [place] of [facility] of [employer]

## 2018-09-29 NOTE — Assessment & Plan Note (Signed)
Checking routine labs, is been 2 years.

## 2018-09-29 NOTE — Assessment & Plan Note (Signed)
Posterior neck, cryotherapy as above.

## 2018-10-10 ENCOUNTER — Encounter: Payer: Self-pay | Admitting: Sports Medicine

## 2018-10-10 MED ORDER — CYCLOBENZAPRINE HCL 10 MG PO TABS: ORAL_TABLET | ORAL | 0 refills | Status: DC

## 2018-10-20 ENCOUNTER — Other Ambulatory Visit: Payer: Self-pay | Admitting: Sports Medicine

## 2018-10-21 ENCOUNTER — Encounter: Payer: Self-pay | Admitting: Sports Medicine

## 2018-10-24 DIAGNOSIS — D509 Iron deficiency anemia, unspecified: Secondary | ICD-10-CM | POA: Insufficient documentation

## 2018-10-24 LAB — LIPID PANEL W/REFLEX DIRECT LDL
Cholesterol: 219 mg/dL — ABNORMAL HIGH (ref <200)
HDL: 41 mg/dL — ABNORMAL LOW (ref >=50)
LDL Cholesterol (Calc): 146 mg/dL (calc) — ABNORMAL HIGH
Non-HDL Cholesterol (Calc): 178 mg/dL (calc) — ABNORMAL HIGH (ref <130)
Total CHOL/HDL Ratio: 5.3 (calc) — ABNORMAL HIGH (ref <5.0)
Triglycerides: 182 mg/dL — ABNORMAL HIGH (ref <150)

## 2018-10-24 LAB — CBC
HCT: 32.2 % — ABNORMAL LOW (ref 35.0–45.0)
Hemoglobin: 10.2 g/dL — ABNORMAL LOW (ref 11.7–15.5)
MCH: 22.5 pg — ABNORMAL LOW (ref 27.0–33.0)
MCHC: 31.7 g/dL — ABNORMAL LOW (ref 32.0–36.0)
MCV: 70.9 fL — ABNORMAL LOW (ref 80.0–100.0)
MPV: 8.7 fL (ref 7.5–12.5)
Platelets: 423 10*3/uL — ABNORMAL HIGH (ref 140–400)
RBC: 4.54 10*6/uL (ref 3.80–5.10)
RDW: 14 % (ref 11.0–15.0)
WBC: 12.3 10*3/uL — ABNORMAL HIGH (ref 3.8–10.8)

## 2018-10-24 LAB — TSH: TSH: 4.89 mIU/L — ABNORMAL HIGH

## 2018-10-24 LAB — COMPLETE METABOLIC PANEL WITH GFR
AG Ratio: 1.3 (calc) (ref 1.0–2.5)
ALT: 14 U/L (ref 6–29)
AST: 14 U/L (ref 10–30)
Albumin: 3.8 g/dL (ref 3.6–5.1)
Alkaline phosphatase (APISO): 142 U/L — ABNORMAL HIGH (ref 31–125)
BUN: 11 mg/dL (ref 7–25)
CO2: 23 mmol/L (ref 20–32)
Calcium: 8.8 mg/dL (ref 8.6–10.2)
Chloride: 100 mmol/L (ref 98–110)
Creat: 0.7 mg/dL (ref 0.50–1.10)
GFR, Est African American: 124 mL/min/{1.73_m2} (ref >=60)
GFR, Est Non African American: 107 mL/min/{1.73_m2} (ref >=60)
Globulin: 3 g/dL (calc) (ref 1.9–3.7)
Glucose, Bld: 126 mg/dL — ABNORMAL HIGH (ref 65–99)
Potassium: 4.3 mmol/L (ref 3.5–5.3)
Sodium: 135 mmol/L (ref 135–146)
Total Bilirubin: 0.2 mg/dL (ref 0.2–1.2)
Total Protein: 6.8 g/dL (ref 6.1–8.1)

## 2018-10-24 LAB — HEMOGLOBIN A1C
Hgb A1c MFr Bld: 5.4 % of total Hgb (ref <5.7)
Mean Plasma Glucose: 108 (calc)
eAG (mmol/L): 6 (calc)

## 2018-10-24 LAB — VITAMIN D 25 HYDROXY (VIT D DEFICIENCY, FRACTURES): Vit D, 25-Hydroxy: 13 ng/mL — ABNORMAL LOW (ref 30–100)

## 2018-10-24 MED ORDER — VITAMIN D (ERGOCALCIFEROL) 1.25 MG (50000 UNIT) PO CAPS: 50000.0000 [IU] | ORAL_CAPSULE | ORAL | 0 refills | Status: DC

## 2018-10-24 NOTE — Assessment & Plan Note (Signed)
There is moderate anemia, microcytic, adding an anemia panel, so add several labs to blood already in the lab, considering her symptoms I do think she needs to see gastroenterology, referral placed.

## 2018-10-24 NOTE — Addendum Note (Signed)
Addended by: Silverio Decamp on: 10/24/2018 09:14 AM   Modules accepted: Orders

## 2018-10-24 NOTE — Addendum Note (Signed)
Addended by: Silverio Decamp on: 10/24/2018 10:32 AM   Modules accepted: Orders

## 2018-10-27 ENCOUNTER — Encounter: Payer: Self-pay | Admitting: Sports Medicine

## 2018-10-27 ENCOUNTER — Encounter: Payer: Self-pay | Admitting: Physician Assistant

## 2018-10-27 ENCOUNTER — Ambulatory Visit: Payer: 59 | Admitting: Sports Medicine

## 2018-10-27 ENCOUNTER — Telehealth: Payer: 59 | Admitting: Physician Assistant

## 2018-10-27 DIAGNOSIS — R05 Cough: Secondary | ICD-10-CM

## 2018-10-27 DIAGNOSIS — R0602 Shortness of breath: Secondary | ICD-10-CM

## 2018-10-27 DIAGNOSIS — Z20822 Contact with and (suspected) exposure to covid-19: Secondary | ICD-10-CM

## 2018-10-27 DIAGNOSIS — R059 Cough, unspecified: Secondary | ICD-10-CM

## 2018-10-27 MED ORDER — ALBUTEROL SULFATE HFA 108 (90 BASE) MCG/ACT IN AERS: 2.0000 | INHALATION_SPRAY | Freq: Four times a day (QID) | RESPIRATORY_TRACT | 0 refills | Status: DC | PRN

## 2018-10-27 MED ORDER — BENZONATATE 100 MG PO CAPS: 100.0000 mg | ORAL_CAPSULE | Freq: Three times a day (TID) | ORAL | 0 refills | Status: DC | PRN

## 2018-10-27 NOTE — Progress Notes (Signed)
E-Visit for Corona Virus Screening   Your current symptoms could be consistent with the coronavirus.  Call your health care provider or local health department to request and arrange formal testing. Many health care providers can now test patients at their office but not all are.  Please quarantine yourself while awaiting your test results.  [place] [phone], [place], [place] [phone] or visit [email or web address]  and You have been enrolled in [place] for COVID-19.  Daily you will receive a questionnaire within the [facility] website. Our COVID-19 response team will be monitoring your responses daily.      COVID-19 is a respiratory illness with symptoms that are similar to the flu. Symptoms are typically mild to moderate, but there have been cases of severe illness and death due to the virus. The following symptoms may appear 2-14 days after exposure: . Fever . Cough . Shortness of breath or difficulty breathing . Chills . Repeated shaking with chills . Muscle pain . Headache . Sore throat . New loss of taste or smell . Fatigue . Congestion or runny nose . Nausea or vomiting . Diarrhea  It is vitally important that if you feel that you have an infection such as this virus or any other virus that you stay home and away from places where you may spread it to others.  You should self-quarantine for 14 days if you have symptoms that could potentially be coronavirus or have been in close contact a with a person diagnosed with COVID-19 within the last 2 weeks. You should avoid contact with people age 10 and older.   You should wear a mask or cloth face covering over your nose and mouth if you must be around other people or animals, including pets (even at home). Try to stay at least 6 feet away from other people. This will protect  the people around you.  You can use medication such as A prescription cough medication called Tessalon Perles 100 mg. You may take 1-2 capsules every 8 hours as needed for cough and A prescription inhaler called Albuterol MDI 90 mcg /actuation 2 puffs every 4 hours as needed for shortness of breath, wheezing, cough  You may also take acetaminophen (Tylenol) as needed for fever.  I HAVE ALSO PROVIDED A WORK NOTE   IF THE FREQUENT SHORTNESS OF BREATH IS NOT TYPICAL FOR YOU OR IF YOU ARE REQUIRING TO USE THE  INHALER MORE THAN PRESRIBED, PLEASE GO TO AN URGENT CARE CENTER OR GO TO THE ER FOR FURTHER EVALUATION.   Reduce your risk of any infection by using the same precautions used for avoiding the common cold or flu:  Marland Kitchen Wash your hands often with soap and warm water for at least 20 seconds.  If soap and water are not readily available, use an alcohol-based hand sanitizer with at least 60% alcohol.  . If coughing or sneezing, cover your mouth and nose by coughing or sneezing into the elbow areas of your shirt or coat, into a tissue or into your sleeve (not your hands). . Avoid shaking hands with others and consider head nods or verbal greetings only. . Avoid touching your eyes, nose, or mouth with unwashed hands.  . Avoid close contact with people who are sick. . Avoid places or events with large numbers of people in one location, like concerts or sporting events. . Carefully consider travel plans you have or are making. . If you are planning  any travel outside or inside the [place], visit the CDC's Travelers' Health webpage for the latest health notices. . If you have some symptoms but not all symptoms, continue to monitor at home and seek medical attention if your symptoms worsen. . If you are having a medical emergency, call 911.  HOME CARE . Only take medications as instructed by your medical team. . Drink plenty of fluids and get plenty of rest. . A steam or ultrasonic humidifier can help if  you have congestion.   GET HELP RIGHT AWAY IF YOU HAVE EMERGENCY WARNING SIGNS** FOR COVID-19. If you or someone is showing any of these signs seek emergency medical care immediately. Call 911 or proceed to your closest emergency facility if: . You develop worsening high fever. . Trouble breathing . Bluish lips or face . Persistent pain or pressure in the chest . New confusion . Inability to wake or stay awake . You cough up blood. . Your symptoms become more severe  **This list is not all possible symptoms. Contact your medical provider for any symptoms that are sever or concerning to you.   MAKE SURE YOU   Understand these instructions.  Will watch your condition.  Will get help right away if you are not doing well or get worse.  Your e-visit answers were reviewed by a board certified advanced clinical practitioner to complete your personal care plan.  Depending on the condition, your plan could have included both over the counter or prescription medications.  If there is a problem please reply once you have received a response from your provider.  Your safety is important to [place].  If you have drug allergies check your prescription carefully.    You can use MyChart to ask questions about today's visit, request a non-urgent call back, or ask for a work or school excuse for 24 hours related to this e-Visit. If it has been greater than 24 hours you will need to follow up with your provider, or enter a new e-Visit to address those concerns. You will get an e-mail in the next two days asking about your experience.  I hope that your e-visit has been valuable and will speed your recovery. Thank you for using e-visits.   I spent 5-10 minutes on review and completion of this note- Lacy Duverney [facility]

## 2018-10-28 ENCOUNTER — Encounter (INDEPENDENT_AMBULATORY_CARE_PROVIDER_SITE_OTHER): Payer: Self-pay

## 2018-10-29 ENCOUNTER — Telehealth: Payer: Self-pay | Admitting: *Deleted

## 2018-10-29 ENCOUNTER — Encounter: Payer: Self-pay | Admitting: Sports Medicine

## 2018-10-29 ENCOUNTER — Encounter (INDEPENDENT_AMBULATORY_CARE_PROVIDER_SITE_OTHER): Payer: Self-pay

## 2018-10-29 NOTE — Telephone Encounter (Signed)
Patient called to discuss worsening appetite after receiving BPA from MyChart COVID-19 symptom monitoring questionnaire. Left message for pt to return call or to contact PCP to discuss symptoms. MyChart message sent with care advice. Pt advised to drink fluids as tolerated and to work her way up to bland solid food such has crackers, soup or bread. Pt advised via MyChart to call PCP if unable to tolerate any foods or liquids.

## 2018-10-30 ENCOUNTER — Encounter (INDEPENDENT_AMBULATORY_CARE_PROVIDER_SITE_OTHER): Payer: Self-pay

## 2018-10-30 MED ORDER — BENZONATATE 200 MG PO CAPS: 200.0000 mg | ORAL_CAPSULE | Freq: Three times a day (TID) | ORAL | 0 refills | Status: DC | PRN

## 2018-11-03 ENCOUNTER — Encounter (INDEPENDENT_AMBULATORY_CARE_PROVIDER_SITE_OTHER): Payer: Self-pay

## 2018-11-09 ENCOUNTER — Encounter: Payer: Self-pay | Admitting: Sports Medicine

## 2018-11-09 ENCOUNTER — Encounter (INDEPENDENT_AMBULATORY_CARE_PROVIDER_SITE_OTHER): Payer: Self-pay

## 2018-11-09 ENCOUNTER — Telehealth: Payer: Self-pay

## 2018-11-09 NOTE — Telephone Encounter (Signed)
Pt called to stated that she is feeling better and is planning to call her PCP to discuss GERD.

## 2018-11-10 NOTE — Telephone Encounter (Signed)
Agreed, I also replied to her asking her to schedule a virtual visit.

## 2018-11-11 ENCOUNTER — Encounter: Payer: Self-pay | Admitting: Sports Medicine

## 2018-11-11 ENCOUNTER — Ambulatory Visit (INDEPENDENT_AMBULATORY_CARE_PROVIDER_SITE_OTHER): Payer: 59 | Admitting: Sports Medicine

## 2018-11-11 DIAGNOSIS — F332 Major depressive disorder, recurrent severe without psychotic features: Secondary | ICD-10-CM

## 2018-11-11 DIAGNOSIS — Z6841 Body Mass Index (BMI) 40.0 and over, adult: Secondary | ICD-10-CM

## 2018-11-11 MED ORDER — TRAZODONE HCL 50 MG PO TABS: 50.0000 mg | ORAL_TABLET | Freq: Every day | ORAL | 1 refills | Status: DC

## 2018-11-11 NOTE — Assessment & Plan Note (Addendum)
Adjustment disorder, multiple stressors at home including mother with a recent suicide attempt. We are avoiding controlled substance with her due to history of suicide attempts as well. She will to gabapentin 300 3 times daily for acute anxiety, continue Effexor 225 daily. She has an appointment with behavioral therapy coming up next week. She would also like trazodone to help her sleep. No current suicidal or homicidal ideation. I like to follow-up with her in person in 2 weeks.

## 2018-11-11 NOTE — Assessment & Plan Note (Signed)
Referral for sleeve gastrectomy.

## 2018-11-11 NOTE — Progress Notes (Signed)
Subjective:    CC: Multiple life stressors  HPI: This is a 43 year old female with a history of severe depression and suicide attempts, she has a history of suicide in her father, and her mother recently attempted suicide, she was on the ventilator, recently taken off, and she is going to be involuntarily admitted for stabilization.  Because of all of this Regina [name] has had a great deal of anxiety, depression, crying, she denies any suicidal or homicidal ideation.  She does have an appointment coming up with her behavioral therapist next Thursday.  I reviewed the past medical history, family history, social history, surgical history, and allergies today and no changes were needed.  Please see the problem list section below in epic for further details.  Past Medical History: Past Medical History:  Diagnosis Date  . Depression   . Depression with anxiety   . Migraine   . Morbid obesity ([place])   . Panic disorder   . Seizures ([place])    Past Surgical History: Past Surgical History:  Procedure Laterality Date  . 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 DILATION AND CURETTAGE, DIAGNOSTIC / THERAPEUTIC    . SOFT TISSUE TUMOR RESECTION  06/17/07   chest/ wound reclosure 4/09'   Social History: Social History   Socioeconomic History  . Marital status: Married    Spouse name: Not on file  . Number of children: Not on file  . Years of education: Not on file  . Highest education level: Not on file  Occupational History  . Not on file  Social Needs  . Financial resource strain: Not on file  . Food insecurity    Worry: Not on file    Inability: Not on file  . Transportation needs    Medical: Not on file    Non-medical: Not on file  Tobacco Use  . Smoking status: Never Smoker  . Smokeless tobacco: Never Used  Substance and Sexual Activity  . Alcohol use: No  . Drug use: No  . Sexual activity: Yes    Birth control/protection: Condom  Lifestyle  . Physical activity    Days per week:  Not on file    Minutes per session: Not on file  . Stress: Not on file  Relationships  . Social Herbalist on phone: Not on file    Gets together: Not on file    Attends religious service: Not on file    Active member of club or organization: Not on file    Attends meetings of clubs or organizations: Not on file    Relationship status: Not on file  Other Topics Concern  . Not on file  Social History Narrative  . Not on file   Family History: Family History  Problem Relation Age of Onset  . Hypertension Mother   . Migraines Mother   . Depression Father   . Suicidality Father    Allergies: Allergies  Allergen Reactions  . Codeine Itching  . Sumatriptan     Palpitations   Medications: See med rec.  Review of Systems: No fevers, chills, night sweats, weight loss, chest pain, or shortness of breath.   Objective:    General: Well Developed, well nourished, and in no acute distress.  Neuro: Alert and oriented x3, extra-ocular muscles intact, sensation grossly intact.  HEENT: Normocephalic, atraumatic, pupils equal round reactive to light, neck supple, no masses, no lymphadenopathy, thyroid nonpalpable.  Skin: Warm and dry, no rashes. Cardiac: Regular rate and  rhythm, no murmurs rubs or gallops, no lower extremity edema.  Respiratory: Clear to auscultation bilaterally. Not using accessory muscles, speaking in full sentences.  Impression and Recommendations:    Severe episode of recurrent major depressive disorder, without psychotic features (HCC) Adjustment disorder, multiple stressors at home including mother with a recent suicide attempt. We are avoiding controlled substance with her due to history of suicide attempts as well. She will to gabapentin 300 3 times daily for acute anxiety, continue Effexor 225 daily. She has an appointment with behavioral therapy coming up next week. She would also like trazodone to help her sleep. No current suicidal or homicidal  ideation. I like to follow-up with her in person in 2 weeks.  Obesity Referral for sleeve gastrectomy.  I spent 25 minutes with this patient, greater than 50% was face-to-face time counseling regarding the above diagnoses.  ___________________________________________ Gwen Her. Dianah Field, M.D., ABFM., CAQSM. Primary Care and Sports Medicine [place] [facility]  Adjunct Professor of [place] of [facility] of Medicine

## 2018-11-20 ENCOUNTER — Ambulatory Visit: Payer: 59 | Admitting: Psychology

## 2018-11-26 ENCOUNTER — Encounter: Payer: Self-pay | Admitting: Sports Medicine

## 2018-11-26 ENCOUNTER — Other Ambulatory Visit: Payer: Self-pay | Admitting: Sports Medicine

## 2018-11-26 DIAGNOSIS — F332 Major depressive disorder, recurrent severe without psychotic features: Secondary | ICD-10-CM

## 2018-12-01 ENCOUNTER — Ambulatory Visit (INDEPENDENT_AMBULATORY_CARE_PROVIDER_SITE_OTHER): Payer: 59 | Admitting: Sports Medicine

## 2018-12-01 ENCOUNTER — Encounter: Payer: Self-pay | Admitting: Sports Medicine

## 2018-12-01 ENCOUNTER — Other Ambulatory Visit: Payer: Self-pay

## 2018-12-01 DIAGNOSIS — F332 Major depressive disorder, recurrent severe without psychotic features: Secondary | ICD-10-CM

## 2018-12-01 MED ORDER — LAMOTRIGINE 25 MG PO TABS: 25.0000 mg | ORAL_TABLET | Freq: Every day | ORAL | 3 refills | Status: DC

## 2018-12-01 NOTE — Assessment & Plan Note (Addendum)
Severe acute adjustment disorder on top of major depression. Multiple stressors at home including mother with recent suicide attempt, daughter with some sexual derangements. Continue gabapentin 300 3 times daily for acute anxiety, Effexor 225 daily. Trazodone for sleep. We are avoiding controlled substances and benzodiazepines with history of suicide attempts, we are going to add Lamictal for mood stabilization.  We can uptitrate as needed. Hydroxyzine was not effective for acute anxiety. Patient does desire to avoid medications in the antipsychotic classes as the do cause some tics. Multiple to try to find her behavioral therapist sooner rather than later, Myra Gianotti was booked out until September.

## 2018-12-01 NOTE — Progress Notes (Signed)
Subjective:    CC: Follow-up  HPI: Anxiety and depression: We were at the cusp of control, and she has seen significant home and family issues.  No current suicidal or homicidal ideation but she does have significant flares in her anxiety with tearfulness and panic.  Due to her recent suicidality we have avoided controlled substances and benzodiazepines.  I reviewed the past medical history, family history, social history, surgical history, and allergies today and no changes were needed.  Please see the problem list section below in epic for further details.  Past Medical History: Past Medical History:  Diagnosis Date  . Depression   . Depression with anxiety   . Migraine   . Morbid obesity ([place])   . Panic disorder   . Seizures ([place])    Past Surgical History: Past Surgical History:  Procedure Laterality Date  . 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 DILATION AND CURETTAGE, DIAGNOSTIC / THERAPEUTIC    . SOFT TISSUE TUMOR RESECTION  06/17/07   chest/ wound reclosure 4/09'   Social History: Social History   Socioeconomic History  . Marital status: Married    Spouse name: Not on file  . Number of children: Not on file  . Years of education: Not on file  . Highest education level: Not on file  Occupational History  . Not on file  Social Needs  . Financial resource strain: Not on file  . Food insecurity    Worry: Not on file    Inability: Not on file  . Transportation needs    Medical: Not on file    Non-medical: Not on file  Tobacco Use  . Smoking status: Never Smoker  . Smokeless tobacco: Never Used  Substance and Sexual Activity  . Alcohol use: No  . Drug use: No  . Sexual activity: Yes    Birth control/protection: Condom  Lifestyle  . Physical activity    Days per week: Not on file    Minutes per session: Not on file  . Stress: Not on file  Relationships  . Social Herbalist on phone: Not on file    Gets together: Not on file    Attends  religious service: Not on file    Active member of club or organization: Not on file    Attends meetings of clubs or organizations: Not on file    Relationship status: Not on file  Other Topics Concern  . Not on file  Social History Narrative  . Not on file   Family History: Family History  Problem Relation Age of Onset  . Hypertension Mother   . Migraines Mother   . Depression Father   . Suicidality Father    Allergies: Allergies  Allergen Reactions  . Codeine Itching  . Sumatriptan     Palpitations   Medications: See med rec.  Review of Systems: No fevers, chills, night sweats, weight loss, chest pain, or shortness of breath.   Objective:    General: Well Developed, well nourished, and in no acute distress.  Neuro: Alert and oriented x3, extra-ocular muscles intact, sensation grossly intact.  HEENT: Normocephalic, atraumatic, pupils equal round reactive to light, neck supple, no masses, no lymphadenopathy, thyroid nonpalpable.  Skin: Warm and dry, no rashes. Cardiac: Regular rate and rhythm, no murmurs rubs or gallops, no lower extremity edema.  Respiratory: Clear to auscultation bilaterally. Not using accessory muscles, speaking in full sentences.  Impression and Recommendations:    Severe episode of  recurrent major depressive disorder, without psychotic features ([place]) Severe acute adjustment disorder on top of major depression. Multiple stressors at home including mother with recent suicide attempt, daughter with some sexual derangements. Continue gabapentin 300 3 times daily for acute anxiety, Effexor 225 daily. Trazodone for sleep. We are avoiding controlled substances and benzodiazepines with history of suicide attempts, we are going to add Lamictal for mood stabilization.  We can uptitrate as needed. Hydroxyzine was not effective for acute anxiety. Patient does desire to avoid medications in the antipsychotic classes as the do cause some tics. Multiple to try to  find her behavioral therapist sooner rather than later, Myra Gianotti was booked out until September.  I spent 25 minutes with this patient, greater than 50% was face-to-face time counseling regarding the above diagnoses.  ___________________________________________ Gwen Her. Dianah Field, M.D., ABFM., CAQSM. Primary Care and Sports Medicine [place] [facility]  Adjunct Professor of [place] of [facility] of Medicine

## 2018-12-03 ENCOUNTER — Encounter: Payer: Self-pay | Admitting: Sports Medicine

## 2018-12-03 ENCOUNTER — Other Ambulatory Visit: Payer: Self-pay | Admitting: Sports Medicine

## 2018-12-03 DIAGNOSIS — F332 Major depressive disorder, recurrent severe without psychotic features: Secondary | ICD-10-CM

## 2018-12-04 ENCOUNTER — Other Ambulatory Visit: Payer: Self-pay | Admitting: Sports Medicine

## 2018-12-04 DIAGNOSIS — F332 Major depressive disorder, recurrent severe without psychotic features: Secondary | ICD-10-CM

## 2018-12-04 MED ORDER — TRAZODONE HCL 50 MG PO TABS: 50.0000 mg | ORAL_TABLET | Freq: Every day | ORAL | 1 refills | Status: DC

## 2018-12-08 ENCOUNTER — Encounter: Payer: Self-pay | Admitting: Sports Medicine

## 2018-12-26 ENCOUNTER — Ambulatory Visit: Payer: 59 | Admitting: Sports Medicine

## 2018-12-27 ENCOUNTER — Other Ambulatory Visit: Payer: Self-pay | Admitting: Sports Medicine

## 2018-12-27 DIAGNOSIS — K219 Gastro-esophageal reflux disease without esophagitis: Secondary | ICD-10-CM

## 2018-12-28 ENCOUNTER — Other Ambulatory Visit: Payer: Self-pay | Admitting: Sports Medicine

## 2018-12-31 ENCOUNTER — Telehealth (INDEPENDENT_AMBULATORY_CARE_PROVIDER_SITE_OTHER): Payer: 59 | Admitting: Sports Medicine

## 2018-12-31 DIAGNOSIS — G894 Chronic pain syndrome: Secondary | ICD-10-CM

## 2018-12-31 DIAGNOSIS — F332 Major depressive disorder, recurrent severe without psychotic features: Secondary | ICD-10-CM

## 2018-12-31 MED ORDER — LAMOTRIGINE 25 MG PO TABS: 50.0000 mg | ORAL_TABLET | Freq: Every day | ORAL | 3 refills | Status: DC

## 2018-12-31 MED ORDER — CYCLOBENZAPRINE HCL 10 MG PO TABS: ORAL_TABLET | ORAL | 0 refills | Status: DC

## 2018-12-31 NOTE — Assessment & Plan Note (Signed)
Fantastic improvement with the addition of Lamictal low dose, increasing to 50 mg, continue Effexor 225 daily, trazodone, she has discontinued her gabapentin. Still awaiting behavioral therapy.

## 2018-12-31 NOTE — Progress Notes (Signed)
Virtual Visit via WebEx/MyChart   I connected with  Regina [name]  on 12/31/18 via WebEx/MyChart/Doximity Video and verified that I am speaking with the correct person using two identifiers.   I discussed the limitations, risks, security and privacy concerns of performing an evaluation and management service by WebEx/MyChart/Doximity Video, including the higher likelihood of inaccurate diagnosis and treatment, and the availability of in person appointments.  We also discussed the likely need of an additional face to face encounter for complete and high quality delivery of care.  I also discussed with the patient that there may be a patient responsible charge related to this service. The patient expressed understanding and wishes to proceed.  Provider location is either at home or medical facility. Patient location is at their home, different from provider location. People involved in care of the patient during this telehealth encounter were myself, my nurse/medical assistant, and my front office/scheduling team member.  Subjective:    CC: Follow-up  HPI: See assessment and plan for further details  I reviewed the past medical history, family history, social history, surgical history, and allergies today and no changes were needed.  Please see the problem list section below in epic for further details.  Past Medical History: Past Medical History:  Diagnosis Date  . Depression   . Depression with anxiety   . Migraine   . Morbid obesity ([place])   . Panic disorder   . Seizures ([place])    Past Surgical History: Past Surgical History:  Procedure Laterality Date  . 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 DILATION AND CURETTAGE, DIAGNOSTIC / THERAPEUTIC    . SOFT TISSUE TUMOR RESECTION  06/17/07   chest/ wound reclosure 4/09'   Social History: Social History   Socioeconomic History  . Marital status: Married    Spouse name: Not on file  . Number of children: Not on file  . Years of  education: Not on file  . Highest education level: Not on file  Occupational History  . Not on file  Social Needs  . Financial resource strain: Not on file  . Food insecurity    Worry: Not on file    Inability: Not on file  . Transportation needs    Medical: Not on file    Non-medical: Not on file  Tobacco Use  . Smoking status: Never Smoker  . Smokeless tobacco: Never Used  Substance and Sexual Activity  . Alcohol use: No  . Drug use: No  . Sexual activity: Yes    Birth control/protection: Condom  Lifestyle  . Physical activity    Days per week: Not on file    Minutes per session: Not on file  . Stress: Not on file  Relationships  . Social Herbalist on phone: Not on file    Gets together: Not on file    Attends religious service: Not on file    Active member of club or organization: Not on file    Attends meetings of clubs or organizations: Not on file    Relationship status: Not on file  Other Topics Concern  . Not on file  Social History Narrative  . Not on file   Family History: Family History  Problem Relation Age of Onset  . Hypertension Mother   . Migraines Mother   . Depression Father   . Suicidality Father    Allergies: Allergies  Allergen Reactions  . Codeine Itching  . Sumatriptan  Palpitations   Medications: See med rec.  Review of Systems: No fevers, chills, night sweats, weight loss, chest pain, or shortness of breath.   Objective:    General: Speaking full sentences, no audible heavy breathing.  Sounds alert and appropriately interactive.  Appears well.  Face symmetric.  Extraocular movements intact.  Pupils equal and round.  No nasal flaring or accessory muscle use visualized.  No other physical exam performed due to the non-physical nature of this visit.  Impression and Recommendations:    Severe episode of recurrent major depressive disorder, without psychotic features ([place]) Fantastic improvement with the addition of  Lamictal low dose, increasing to 50 mg, continue Effexor 225 daily, trazodone, she has discontinued her gabapentin. Still awaiting behavioral therapy.  I discussed the above assessment and treatment plan with the patient. The patient was provided an opportunity to ask questions and all were answered. The patient agreed with the plan and demonstrated an understanding of the instructions.   The patient was advised to call back or seek an in-person evaluation if the symptoms worsen or if the condition fails to improve as anticipated.   I provided 25 minutes of non-face-to-face time during this encounter, 15 minutes of additional time was needed to gather information, review chart, records, communicate/coordinate with staff remotely, troubleshooting the multiple errors that we get every time when trying to do video calls through the electronic medical record, WebEx, and Doximity, restart the encounter multiple times due to instability of the software, as well as complete documentation.   ___________________________________________ Gwen Her. Dianah Field, M.D., ABFM., CAQSM. Primary Care and Sports Medicine [facility]  Adjunct Professor of [place] [employer] [facility] of Medicine

## 2019-01-08 ENCOUNTER — Ambulatory Visit: Payer: 59 | Admitting: Psychology

## 2019-01-21 ENCOUNTER — Encounter: Payer: Self-pay | Admitting: Sports Medicine

## 2019-01-28 ENCOUNTER — Telehealth: Payer: 59 | Admitting: Sports Medicine

## 2019-02-02 ENCOUNTER — Other Ambulatory Visit: Payer: Self-pay | Admitting: Sports Medicine

## 2019-02-02 DIAGNOSIS — G894 Chronic pain syndrome: Secondary | ICD-10-CM

## 2019-04-20 ENCOUNTER — Telehealth: Payer: Self-pay | Admitting: Sports Medicine

## 2019-04-20 NOTE — Telephone Encounter (Signed)
I sent Dr. Darene Lamer a message, but he seems to be confused as to what I am needing. After wearing my mask for an hour on Monday night for an evening school event, I had a panic attack (feeling like I couldn't breathe) and wound up hyperventilating. It took 20+ minutes to recover. I need to be able to step away and take mask breaks at work. Once every hour for 3-5 minutes should be enough.    Is she going to need a virtual appointment or can you write a note?

## 2019-04-20 NOTE — Telephone Encounter (Signed)
Decided to tag previous message.

## 2019-04-20 NOTE — Telephone Encounter (Signed)
We can certainly do a virtual visit IF and only IF she would like to discuss her anxiety, but I already told her I am not going to write her a mask exception.

## 2019-04-30 ENCOUNTER — Encounter: Payer: Self-pay | Admitting: Sports Medicine

## 2019-04-30 ENCOUNTER — Telehealth (INDEPENDENT_AMBULATORY_CARE_PROVIDER_SITE_OTHER): Payer: No Typology Code available for payment source | Admitting: Sports Medicine

## 2019-04-30 DIAGNOSIS — F332 Major depressive disorder, recurrent severe without psychotic features: Secondary | ICD-10-CM | POA: Diagnosis not present

## 2019-04-30 MED ORDER — LAMOTRIGINE 25 MG PO TABS: 50.0000 mg | ORAL_TABLET | Freq: Two times a day (BID) | ORAL | 3 refills | Status: DC

## 2019-04-30 NOTE — Progress Notes (Signed)
dgdfgd

## 2019-04-30 NOTE — Progress Notes (Signed)
Virtual Visit via WebEx/MyChart   I connected with  Regina [name]  on 04/30/19 via WebEx/MyChart/Doximity Video and verified that I am speaking with the correct person using two identifiers.   I discussed the limitations, risks, security and privacy concerns of performing an evaluation and management service by WebEx/MyChart/Doximity Video, including the higher likelihood of inaccurate diagnosis and treatment, and the availability of in person appointments.  We also discussed the likely need of an additional face to face encounter for complete and high quality delivery of care.  I also discussed with the patient that there may be a patient responsible charge related to this service. The patient expressed understanding and wishes to proceed.  Provider location is either at home or medical facility. Patient location is at their home, different from provider location. People involved in care of the patient during this telehealth encounter were myself, my nurse/medical assistant, and my front office/scheduling team member.  Subjective:    CC: Anxiety  HPI: Regina [name] is a 43 year old female, she has persistent anxiety, she was initially asking for a mask exception, she really just needs a break of 5 minutes every hour.  She notes that her medications are doing okay, the addition of Lamictal seem to help considerably.  No suicidal or homicidal ideation currently.  I reviewed the past medical history, family history, social history, surgical history, and allergies today and no changes were needed.  Please see the problem list section below in epic for further details.  Past Medical History: Past Medical History:  Diagnosis Date  . Depression   . Depression with anxiety   . Migraine   . Morbid obesity ([place])   . Panic disorder   . Seizures ([place])    Past Surgical History: Past Surgical History:  Procedure Laterality Date  . 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 DILATION AND CURETTAGE,  DIAGNOSTIC / THERAPEUTIC    . SOFT TISSUE TUMOR RESECTION  06/17/07   chest/ wound reclosure 4/09'   Social History: Social History   Socioeconomic History  . Marital status: Married    Spouse name: Not on file  . Number of children: Not on file  . Years of education: Not on file  . Highest education level: Not on file  Occupational History  . Not on file  Tobacco Use  . Smoking status: Never Smoker  . Smokeless tobacco: Never Used  Substance and Sexual Activity  . Alcohol use: No  . Drug use: No  . Sexual activity: Yes    Birth control/protection: Condom  Other Topics Concern  . Not on file  Social History Narrative  . Not on file   Social Determinants of Health   Financial Resource Strain:   . Difficulty of Paying Living Expenses: Not on file  Food Insecurity:   . Worried About Charity fundraiser in the Last Year: Not on file  . Ran Out of Food in the Last Year: Not on file  Transportation Needs:   . Lack of Transportation (Medical): Not on file  . Lack of Transportation (Non-Medical): Not on file  Physical Activity:   . Days of Exercise per Week: Not on file  . Minutes of Exercise per Session: Not on file  Stress:   . Feeling of Stress : Not on file  Social Connections:   . Frequency of Communication with Friends and Family: Not on file  . Frequency of Social Gatherings with Friends and Family: Not on file  . Attends  Religious Services: Not on file  . Active Member of Clubs or Organizations: Not on file  . Attends Archivist Meetings: Not on file  . Marital Status: Not on file   Family History: Family History  Problem Relation Age of Onset  . Hypertension Mother   . Migraines Mother   . Depression Father   . Suicidality Father    Allergies: Allergies  Allergen Reactions  . Codeine Itching  . Sumatriptan     Palpitations   Medications: See med rec.  Review of Systems: No fevers, chills, night sweats, weight loss, chest pain, or  shortness of breath.   Objective:    General: Speaking full sentences, no audible heavy breathing.  Sounds alert and appropriately interactive.  Appears well.  Face symmetric.  Extraocular movements intact.  Pupils equal and round.  No nasal flaring or accessory muscle use visualized.  No other physical exam performed due to the non-physical nature of this visit.  Impression and Recommendations:    Severe episode of recurrent major depressive disorder, without psychotic features (HCC) Anxiety and depression, continue venlafaxine 225 mg, increasing Lamictal to 50 mg twice daily. She tells me Lamictal has been tremendously efficacious. Continue Trokendi, hydroxyzine at bedtime, trazodone at bedtime. We can follow-up again with this in a month, she was asking for a mask break, I will not do this however I am happy to write her a 5-minute break once an hour for anxiety purposes, what she does on this break is up to her.  I discussed the above assessment and treatment plan with the patient. The patient was provided an opportunity to ask questions and all were answered. The patient agreed with the plan and demonstrated an understanding of the instructions.   The patient was advised to call back or seek an in-person evaluation if the symptoms worsen or if the condition fails to improve as anticipated.   I provided 25 minutes of non-face-to-face time during this encounter, 15 minutes of additional time was needed to gather information, review chart, records, communicate/coordinate with staff remotely, troubleshooting the multiple errors that we get every time when trying to do video calls through the electronic medical record, WebEx, and Doximity, restart the encounter multiple times due to instability of the software, as well as complete documentation.   ___________________________________________ Gwen Her. Dianah Field, M.D., ABFM., CAQSM. Primary Care and Sports Medicine [facility]  [facility]  Adjunct Professor of [place] of [facility] of Medicine

## 2019-04-30 NOTE — Assessment & Plan Note (Signed)
Anxiety and depression, continue venlafaxine 225 mg, increasing Lamictal to 50 mg twice daily. She tells me Lamictal has been tremendously efficacious. Continue Trokendi, hydroxyzine at bedtime, trazodone at bedtime. We can follow-up again with this in a month, she was asking for a mask break, I will not do this however I am happy to write her a 5-minute break once an hour for anxiety purposes, what she does on this break is up to her.

## 2019-05-01 DIAGNOSIS — D649 Anemia, unspecified: Secondary | ICD-10-CM

## 2019-05-01 HISTORY — DX: Anemia, unspecified: D64.9

## 2019-06-07 ENCOUNTER — Other Ambulatory Visit: Payer: Self-pay | Admitting: Sports Medicine

## 2019-06-07 DIAGNOSIS — F332 Major depressive disorder, recurrent severe without psychotic features: Secondary | ICD-10-CM

## 2019-06-11 ENCOUNTER — Other Ambulatory Visit: Payer: Self-pay | Admitting: Sports Medicine

## 2019-06-11 DIAGNOSIS — F332 Major depressive disorder, recurrent severe without psychotic features: Secondary | ICD-10-CM

## 2019-06-11 MED ORDER — LAMOTRIGINE 25 MG PO TABS: 50.0000 mg | ORAL_TABLET | Freq: Two times a day (BID) | ORAL | 3 refills | Status: DC

## 2019-07-05 ENCOUNTER — Ambulatory Visit: Payer: 59 | Attending: Internal Medicine

## 2019-07-05 DIAGNOSIS — Z23 Encounter for immunization: Secondary | ICD-10-CM

## 2019-07-05 NOTE — Progress Notes (Signed)
   Covid-19 Vaccination Clinic  Name:  Regina [name]    MRN: [id number] DOB: [birth year]  07/05/2019  [name] was observed post Covid-19 immunization for 15 minutes without incident. She was provided with Vaccine Information Sheet and instruction to access the V-Safe system.   [name] was instructed to call 911 with any severe reactions post vaccine: Marland Kitchen Difficulty breathing  . Swelling of face and throat  . A fast heartbeat  . A bad rash all over body  . Dizziness and weakness   Immunizations Administered    Name Date Dose VIS Date Route   Pfizer COVID-19 Vaccine 07/05/2019  5:36 PM 0.3 mL 04/10/2019 Intramuscular   Manufacturer: [place]   Lot: [id number]   [place]: [id number]

## 2019-07-26 ENCOUNTER — Ambulatory Visit: Payer: Self-pay

## 2019-09-03 ENCOUNTER — Other Ambulatory Visit: Payer: Self-pay | Admitting: Sports Medicine

## 2019-09-03 DIAGNOSIS — K219 Gastro-esophageal reflux disease without esophagitis: Secondary | ICD-10-CM

## 2019-09-13 DIAGNOSIS — K219 Gastro-esophageal reflux disease without esophagitis: Secondary | ICD-10-CM

## 2019-09-14 MED ORDER — PANTOPRAZOLE SODIUM 40 MG PO TBEC: 40.0000 mg | DELAYED_RELEASE_TABLET | Freq: Every day | ORAL | 1 refills | Status: DC

## 2019-11-29 ENCOUNTER — Other Ambulatory Visit: Payer: Self-pay | Admitting: Sports Medicine

## 2019-11-29 DIAGNOSIS — F332 Major depressive disorder, recurrent severe without psychotic features: Secondary | ICD-10-CM

## 2019-12-07 ENCOUNTER — Other Ambulatory Visit (HOSPITAL_COMMUNITY)
Admission: RE | Admit: 2019-12-07 | Discharge: 2019-12-07 | Disposition: A | Discharge status: Home / Self Care | Payer: No Typology Code available for payment source | Source: Ambulatory Visit | Attending: Obstetrics and Gynecology | Admitting: Obstetrics and Gynecology

## 2019-12-07 ENCOUNTER — Other Ambulatory Visit: Payer: Self-pay

## 2019-12-07 ENCOUNTER — Ambulatory Visit (INDEPENDENT_AMBULATORY_CARE_PROVIDER_SITE_OTHER): Payer: No Typology Code available for payment source | Admitting: Obstetrics and Gynecology

## 2019-12-07 ENCOUNTER — Encounter: Payer: Self-pay | Admitting: Obstetrics and Gynecology

## 2019-12-07 VITALS — BP 148/76 | HR 96 | Ht 63.0 in | Wt 299.0 lb

## 2019-12-07 DIAGNOSIS — Z124 Encounter for screening for malignant neoplasm of cervix: Secondary | ICD-10-CM | POA: Diagnosis present

## 2019-12-07 DIAGNOSIS — Z01419 Encounter for gynecological examination (general) (routine) without abnormal findings: Secondary | ICD-10-CM

## 2019-12-07 DIAGNOSIS — Z1231 Encounter for screening mammogram for malignant neoplasm of breast: Secondary | ICD-10-CM | POA: Diagnosis not present

## 2019-12-07 DIAGNOSIS — N939 Abnormal uterine and vaginal bleeding, unspecified: Secondary | ICD-10-CM

## 2019-12-07 LAB — POCT URINE PREGNANCY: Preg Test, Ur: NEGATIVE

## 2019-12-07 MED ORDER — MEDROXYPROGESTERONE ACETATE 10 MG PO TABS
20.0000 mg | ORAL_TABLET | Freq: Every day | ORAL | 2 refills | Status: DC
Start: 2019-12-07 — End: 2019-12-09

## 2019-12-07 NOTE — Progress Notes (Signed)
ENDOMETRIAL BIOPSY      Regina [name] is a 44 y.o. [phone] here for endometrial biopsy.  The indications for endometrial biopsy were reviewed.  Risks of the biopsy including cramping, bleeding, infection, uterine perforation, inadequate specimen and need for additional procedures were discussed. The patient states she understands and agrees to undergo procedure today. Consent was signed. Time out was performed.   Indications: AUB Urine HCG: negative  A bivalve speculum was placed into the vagina and the cervix was easily visualized and was prepped with Betadine x2. A single-toothed tenaculum was placed on the anterior lip of the cervix to stabilize it. The 3 mm pipelle was introduced into the endometrial cavity without difficulty to a depth of 10 cm, and a moderate amount of tissue was obtained and sent to pathology. This was repeated for a total of 4 passes. The instruments were removed from the patient's vagina. Minimal bleeding from the cervix at the tenaculum was noted.   The patient tolerated the procedure well. Routine post-procedure instructions were given to the patient.    Will base further management on results of biopsy.  Feliz Beam, M.D. Attending Center for [employer] Fish farm manager)

## 2019-12-07 NOTE — Progress Notes (Signed)
Pt states she has been bleeding everyday since June

## 2019-12-07 NOTE — Progress Notes (Signed)
GYNECOLOGY ANNUAL PREVENTATIVE CARE ENCOUNTER NOTE  Subjective:   Regina [name] is a 44 y.o. [id number] female here for a annual gynecologic exam. Current complaints: bleeding every day since June. Sometimes she has significantly heavy bleeding, sometimes it is very light. Prior to June, was having periods monthly. Occasionally will skip periods. Monthly periods are not heavy, lasting 5-6 days.   Denies abnormal  discharge, pelvic pain, problems with intercourse or other gynecologic concerns.    Gynecologic History Patient's last menstrual period was 10/07/2019. Contraception: condoms Last Pap: 2012. Results: normal Last mammogram: never had DEXA: has never had  Obstetric History OB History  Gravida Para Term Preterm AB Living  5 3 3    0 3  SAB TAB Ectopic Multiple Live Births               # Outcome Date GA Lbr Len/2nd Weight Sex Delivery Anes PTL Lv  5 Gravida           4 Gravida           3 Term           2 Term           1 Term             Past Medical History:  Diagnosis Date  . Depression   . Depression with anxiety   . Migraine   . Morbid obesity ([place])   . Panic disorder   . Seizures ([place])     Past Surgical History:  Procedure Laterality Date  . 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 DILATION AND CURETTAGE, DIAGNOSTIC / THERAPEUTIC    . SOFT TISSUE TUMOR RESECTION  06/17/07   chest/ wound reclosure 4/09'    Current Outpatient Medications on File Prior to Visit  Medication Sig Dispense Refill  . cyclobenzaprine (FLEXERIL) 10 MG tablet 1/2 TAB BY MOUTH AT BEDTIME THEN INCREASE GRADUALLY TO 1 TAB BY MOUTH 3 TIMES A DAY 30 tablet 0  . hydrOXYzine (ATARAX/VISTARIL) 50 MG tablet TAKE 1 TABLET (50 MG TOTAL) BY MOUTH AT BEDTIME. NO FURTHER REFILLS WITHOUT APPT- CAN BE VIRTUAL 90 tablet 2  . lamoTRIgine (LAMICTAL) 25 MG tablet Take 2 tablets (50 mg total) by mouth 2 (two) times daily. 120 tablet 3  . ondansetron (ZOFRAN ODT) 4 MG disintegrating tablet 4mg  ODT q4  hours prn nausea/vomit 8 tablet 0  . pantoprazole (PROTONIX) 40 MG tablet Take 1 tablet (40 mg total) by mouth daily. 90 tablet 1  . rizatriptan (MAXALT-MLT) 5 MG disintegrating tablet Take 1 tablet (5 mg total) by mouth as needed for migraine. May repeat in 2 hours if needed 10 tablet 2  . traZODone (DESYREL) 50 MG tablet TAKE 1 TABLET BY MOUTH EVERYDAY AT BEDTIME 90 tablet 1  . TROKENDI XR 50 MG CP24 TAKE 1 CAPSULE BY MOUTH EVERY DAY 90 capsule 0  . venlafaxine XR (EFFEXOR-XR) 75 MG 24 hr capsule TAKE 3 CAPSULES (225 MG TOTAL) BY MOUTH DAILY WITH BREAKFAST. 270 capsule 2  . albuterol (VENTOLIN HFA) 108 (90 Base) MCG/ACT inhaler Inhale 2 puffs into the lungs every 6 (six) hours as needed for wheezing or shortness of breath. 6.7 g 0   No current facility-administered medications on file prior to visit.    Allergies  Allergen Reactions  . Codeine Itching  . Sumatriptan     Palpitations    Social History   Socioeconomic History  . Marital status: Married    Spouse  name: Not on file  . Number of children: Not on file  . Years of education: Not on file  . Highest education level: Not on file  Occupational History  . Not on file  Tobacco Use  . Smoking status: Never Smoker  . Smokeless tobacco: Never Used  Vaping Use  . Vaping Use: Never used  Substance and Sexual Activity  . Alcohol use: No  . Drug use: No  . Sexual activity: Yes    Birth control/protection: Condom  Other Topics Concern  . Not on file  Social History Narrative  . Not on file   Social Determinants of Health   Financial Resource Strain:   . Difficulty of Paying Living Expenses:   Food Insecurity:   . Worried About Charity fundraiser in the Last Year:   . Arboriculturist in the Last Year:   Transportation Needs:   . Film/video editor (Medical):   Marland Kitchen Lack of Transportation (Non-Medical):   Physical Activity:   . Days of Exercise per Week:   . Minutes of Exercise per Session:   Stress:   . Feeling  of Stress :   Social Connections:   . Frequency of Communication with Friends and Family:   . Frequency of Social Gatherings with Friends and Family:   . Attends Religious Services:   . Active Member of Clubs or Organizations:   . Attends Archivist Meetings:   Marland Kitchen Marital Status:   Intimate Partner Violence:   . Fear of Current or Ex-Partner:   . Emotionally Abused:   Marland Kitchen Physically Abused:   . Sexually Abused:     Family History  Problem Relation Age of Onset  . Hypertension Mother   . Migraines Mother   . Depression Father   . Suicidality Father     The following portions of the patient's history were reviewed and updated as appropriate: allergies, current medications, past family history, past medical history, past social history, past surgical history and problem list.  Review of Systems Pertinent items are noted in HPI.   Objective:  BP (!) 148/76   Pulse 96   Ht 5\' 3"  (1.6 m)   Wt 299 lb (135.6 kg)   LMP 10/07/2019   BMI 52.97 kg/m  CONSTITUTIONAL: Well-developed, well-nourished female in no acute distress.  HENT:  Normocephalic, atraumatic, External right and left ear normal. Oropharynx is clear and moist EYES: Conjunctivae and EOM are normal. Pupils are equal, round, and reactive to light. No scleral icterus.  NECK: Normal range of motion, supple, no masses.  Normal thyroid.  SKIN: Skin is warm and dry. No rash noted. Not diaphoretic. No erythema. No pallor. NEUROLOGIC: Alert and oriented to person, place, and time. Normal reflexes, muscle tone coordination. No cranial nerve deficit noted. PSYCHIATRIC: Normal mood and affect. Normal behavior. Normal judgment and thought content. CARDIOVASCULAR: Normal heart rate noted RESPIRATORY: Effort ormal, no problems with respiration noted. BREASTS: deferred ABDOMEN: Soft, no distention noted.  No tenderness, rebound or guarding.  PELVIC: Normal appearing external genitalia; normal appearing vaginal mucosa and  cervix.  No abnormal discharge noted.  Pap smear obtained. EMB compelted, see note. MUSCULOSKELETAL: Normal range of motion. No tenderness.  No cyanosis, clubbing, or edema.  2+ distal pulses.  Exam done with chaperone present.   Assessment and Plan:   1. Abnormal uterine bleeding (AUB) Start provera - TSH - Hemoglobin A1c - POCT urine pregnancy - Surgical pathology( [place]/ [facility]) - CBC - DHEA-sulfate -  Estradiol - Follicle stimulating hormone - Beta hCG quant (ref lab) - Luteinizing hormone - Prolactin - Testosterone,Free and Total  2. Well woman exam  3. Encounter for screening mammogram for malignant neoplasm of breast Mammogram ordered today  4. Cervical cancer screening Pap today  Will follow up results of pap smear screen and manage accordingly. Encouraged improvement in diet and exercise. Declines STI screen  Mammogram ordered Referral for colonoscopy n/a DEXA not due based on age  Routine preventative health maintenance measures emphasized. Please refer to After Visit Summary for other counseling recommendations.   Total face-to-face time with patient: 35 minutes. Over 50% of encounter was spent on counseling and coordination of care.   Feliz Beam, M.D. Attending Center for [employer] Fish farm manager)

## 2019-12-08 ENCOUNTER — Other Ambulatory Visit: Payer: Self-pay | Admitting: Obstetrics and Gynecology

## 2019-12-08 LAB — CYTOLOGY - PAP
Comment: NEGATIVE
Diagnosis: NEGATIVE
High risk HPV: NEGATIVE

## 2019-12-09 ENCOUNTER — Telehealth: Payer: Self-pay | Admitting: *Deleted

## 2019-12-09 MED ORDER — MEDROXYPROGESTERONE ACETATE 10 MG PO TABS: 20.0000 mg | ORAL_TABLET | Freq: Every day | ORAL | 2 refills | Status: DC

## 2019-12-09 NOTE — Telephone Encounter (Signed)
Pt called stating that her RX for Progesterone that Dr Rosana Hoes sent to her pharmacy ([place]) was not there and they told her to call and have [place] to resend it.  RX resent to [facility] and pt notified

## 2019-12-14 ENCOUNTER — Other Ambulatory Visit: Payer: Self-pay | Admitting: Sports Medicine

## 2019-12-14 DIAGNOSIS — F332 Major depressive disorder, recurrent severe without psychotic features: Secondary | ICD-10-CM

## 2019-12-15 ENCOUNTER — Encounter (INDEPENDENT_AMBULATORY_CARE_PROVIDER_SITE_OTHER): Payer: No Typology Code available for payment source

## 2019-12-15 ENCOUNTER — Ambulatory Visit (INDEPENDENT_AMBULATORY_CARE_PROVIDER_SITE_OTHER): Payer: No Typology Code available for payment source

## 2019-12-15 DIAGNOSIS — B379 Candidiasis, unspecified: Secondary | ICD-10-CM | POA: Diagnosis not present

## 2019-12-15 DIAGNOSIS — N939 Abnormal uterine and vaginal bleeding, unspecified: Secondary | ICD-10-CM | POA: Diagnosis not present

## 2019-12-16 ENCOUNTER — Other Ambulatory Visit: Payer: Self-pay | Admitting: Obstetrics and Gynecology

## 2019-12-16 MED ORDER — FLUCONAZOLE 150 MG PO TABS: 150.0000 mg | ORAL_TABLET | Freq: Once | ORAL | 3 refills | Status: AC

## 2019-12-25 ENCOUNTER — Telehealth: Payer: Self-pay | Admitting: *Deleted

## 2019-12-25 NOTE — Telephone Encounter (Signed)
Left patient an urgent message to call and reschedule appointment on 01/07/20 at 3:45 PM for 01/14/2020 with Dr. Rosana Hoes.

## 2019-12-28 ENCOUNTER — Encounter: Payer: No Typology Code available for payment source | Admitting: Sports Medicine

## 2019-12-30 ENCOUNTER — Encounter: Payer: No Typology Code available for payment source | Admitting: Sports Medicine

## 2020-01-02 ENCOUNTER — Other Ambulatory Visit: Payer: Self-pay | Admitting: Obstetrics and Gynecology

## 2020-01-06 ENCOUNTER — Other Ambulatory Visit: Payer: Self-pay

## 2020-01-06 DIAGNOSIS — G894 Chronic pain syndrome: Secondary | ICD-10-CM

## 2020-01-06 MED ORDER — CYCLOBENZAPRINE HCL 10 MG PO TABS: 10.0000 mg | ORAL_TABLET | Freq: Three times a day (TID) | ORAL | 0 refills | Status: DC | PRN

## 2020-01-07 ENCOUNTER — Ambulatory Visit: Payer: No Typology Code available for payment source | Admitting: Obstetrics and Gynecology

## 2020-01-11 DIAGNOSIS — E785 Hyperlipidemia, unspecified: Secondary | ICD-10-CM

## 2020-01-13 ENCOUNTER — Other Ambulatory Visit: Payer: Self-pay | Admitting: Obstetrics and Gynecology

## 2020-01-14 ENCOUNTER — Ambulatory Visit (INDEPENDENT_AMBULATORY_CARE_PROVIDER_SITE_OTHER): Payer: No Typology Code available for payment source | Admitting: Obstetrics and Gynecology

## 2020-01-14 ENCOUNTER — Encounter: Payer: Self-pay | Admitting: Obstetrics and Gynecology

## 2020-01-14 ENCOUNTER — Other Ambulatory Visit: Payer: Self-pay

## 2020-01-14 VITALS — Ht 63.0 in | Wt 293.0 lb

## 2020-01-14 DIAGNOSIS — N939 Abnormal uterine and vaginal bleeding, unspecified: Secondary | ICD-10-CM | POA: Diagnosis not present

## 2020-01-14 NOTE — Progress Notes (Signed)
GYNECOLOGY OFFICE FOLLOW UP NOTE  History:  44 y.o. [id number] here today for follow up for heavy menses. Had heavy bleeding June - Aug but that has stopped. Prior to that menses/bleeding was not heavy. Did not improve much with provera. She is here today for discussion, labs and EMB.  Past Medical History:  Diagnosis Date  . Depression   . Depression with anxiety   . Migraine   . Morbid obesity ([place])   . Panic disorder   . Seizures ([place])     Past Surgical History:  Procedure Laterality Date  . 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 DILATION AND CURETTAGE, DIAGNOSTIC / THERAPEUTIC    . SOFT TISSUE TUMOR RESECTION  06/17/07   chest/ wound reclosure 4/09'    No current facility-administered medications for this visit. No current outpatient medications on file.  Facility-Administered Medications Ordered in Other Visits:  .  0.9 %  sodium chloride infusion (Manually program via Guardrails IV Fluids), , Intravenous, Once, Allie Bossier, MD .  acetaminophen (TYLENOL) tablet 650 mg, 650 mg, Oral, Q6H PRN, 650 mg at 01/17/20 2129 **OR** acetaminophen (TYLENOL) suppository 650 mg, 650 mg, Rectal, Q6H PRN, Allie Bossier, MD .  albuterol (VENTOLIN HFA) 108 (90 Base) MCG/ACT inhaler 2 puff, 2 puff, Inhalation, Q6H PRN, Allie Bossier, MD .  cyclobenzaprine (FLEXERIL) tablet 10 mg, 10 mg, Oral, TID PRN, Allie Bossier, MD .  hydrOXYzine (ATARAX/VISTARIL) tablet 50 mg, 50 mg, Oral, QHS, Allie Bossier, MD, 50 mg at 01/17/20 2129 .  lamoTRIgine (LAMICTAL) tablet 50 mg, 50 mg, Oral, BID, Allie Bossier, MD .  medroxyPROGESTERone (PROVERA) tablet 20 mg, 20 mg, Oral, Daily, Allie Bossier, MD, 20 mg at 01/18/20 0858 .  pantoprazole (PROTONIX) EC tablet 40 mg, 40 mg, Oral, Daily, Allie Bossier, MD, 40 mg at 01/18/20 0859 .  traZODone (DESYREL) tablet 50 mg, 50 mg, Oral, QHS PRN, Allie Bossier, MD, 50 mg at 01/17/20 2205 .  venlafaxine XR (EFFEXOR-XR) 24 hr capsule 225 mg, 225 mg,  Oral, Q breakfast, Allie Bossier, MD, 225 mg at 01/18/20 7902  The following portions of the patient's history were reviewed and updated as appropriate: allergies, current medications, past family history, past medical history, past social history, past surgical history and problem list.   Review of Systems:  Pertinent items noted in HPI and remainder of comprehensive ROS otherwise negative.   Objective:  Physical Exam Ht 5\' 3"  (1.6 m)   Wt 293 lb (132.9 kg)   BMI 51.90 kg/m  CONSTITUTIONAL: Well-developed, well-nourished female in no acute distress.  HENT:  Normocephalic, atraumatic. External right and left ear normal. Oropharynx is clear and moist EYES: Conjunctivae and EOM are normal. Pupils are equal, round, and reactive to light. No scleral icterus.  NECK: Normal range of motion, supple, no masses SKIN: Skin is warm and dry. No rash noted. Not diaphoretic. No erythema. No pallor. NEUROLOGIC: Alert and oriented to person, place, and time. Normal reflexes, muscle tone coordination. No cranial nerve deficit noted. PSYCHIATRIC: Normal mood and affect. Normal behavior. Normal judgment and thought content. CARDIOVASCULAR: Normal heart rate noted RESPIRATORY: Effort normal, no problems with respiration noted ABDOMEN: Soft, no distention noted.   PELVIC: Normal appearing external genitalia; normal appearing vaginal mucosa and cervix.  No abnormal discharge noted.  See note for EMB MUSCULOSKELETAL: Normal range of motion. No edema noted.  Exam done with chaperone present.  Labs and Imaging No results found.  Assessment & Plan:   1. Abnormal uterine bleeding (AUB) - Pt to get labs that she was not able to get done last visit - Cont provera, switch to megace as needed - Reviewed [place] and thickened endometrium, recommend for D&C hysteroscopy to thin lining of uterus - reviewed risks/benefits of D&C hysteroscopy including hemorrhage, infection, damage to surrounding tissue and organs -  Recommend IUD to control bleeding - Reviewed risks/benefits of above and how IUD would likely decrease bleeding but may not stop altogether, but as he bleeding was not problematic until recently, may do well enough to improve bleeding profile - EMB to rule out malignancy today, see note - will plan for [facility] hysteroscopy, Mirena IUD placement - Reviewed pre-op instructions, expected post-op course. She understands she will need to be NPO after midnight the night prior to the procedure. She understands she will have a PAT visit. She understands she will be notified by the administrative scheduler to schedule date/time for the above. Answered all questions, she will call with any issues.   Routine preventative health maintenance measures emphasized. Please refer to After Visit Summary for other counseling recommendations.   Return in about 3 months (around 04/14/2020) for Followup.  Feliz Beam, M.D. Attending Center for [employer] [facility])

## 2020-01-15 LAB — COMPLETE METABOLIC PANEL WITH GFR
AG Ratio: 1.6 (calc) (ref 1.0–2.5)
ALT: 11 U/L (ref 6–29)
AST: 9 U/L — ABNORMAL LOW (ref 10–30)
Albumin: 3.9 g/dL (ref 3.6–5.1)
Alkaline phosphatase (APISO): 102 U/L (ref 31–125)
BUN: 12 mg/dL (ref 7–25)
CO2: 23 mmol/L (ref 20–32)
Calcium: 8.4 mg/dL — ABNORMAL LOW (ref 8.6–10.2)
Chloride: 105 mmol/L (ref 98–110)
Creat: 0.72 mg/dL (ref 0.50–1.10)
GFR, Est African American: 119 mL/min/{1.73_m2} (ref >=60)
GFR, Est Non African American: 103 mL/min/{1.73_m2} (ref >=60)
Globulin: 2.5 g/dL (calc) (ref 1.9–3.7)
Glucose, Bld: 76 mg/dL (ref 65–99)
Potassium: 4.3 mmol/L (ref 3.5–5.3)
Sodium: 139 mmol/L (ref 135–146)
Total Bilirubin: 0.2 mg/dL (ref 0.2–1.2)
Total Protein: 6.4 g/dL (ref 6.1–8.1)

## 2020-01-15 LAB — CBC
HCT: 24.5 % — ABNORMAL LOW (ref 35.0–45.0)
HCT: 24.4 % — ABNORMAL LOW (ref 35.0–45.0)
Hemoglobin: 6.5 g/dL — ABNORMAL LOW (ref 11.7–15.5)
Hemoglobin: 6.6 g/dL — ABNORMAL LOW (ref 11.7–15.5)
MCH: 16.3 pg — ABNORMAL LOW (ref 27.0–33.0)
MCH: 16.6 pg — ABNORMAL LOW (ref 27.0–33.0)
MCHC: 26.5 g/dL — ABNORMAL LOW (ref 32.0–36.0)
MCHC: 27 g/dL — ABNORMAL LOW (ref 32.0–36.0)
MCV: 61.6 fL — ABNORMAL LOW (ref 80.0–100.0)
MCV: 61.5 fL — ABNORMAL LOW (ref 80.0–100.0)
MPV: 8.8 fL (ref 7.5–12.5)
MPV: 8.8 fL (ref 7.5–12.5)
Platelets: 499 10*3/uL — ABNORMAL HIGH (ref 140–400)
Platelets: 519 10*3/uL — ABNORMAL HIGH (ref 140–400)
RBC: 3.98 10*6/uL (ref 3.80–5.10)
RBC: 3.97 10*6/uL (ref 3.80–5.10)
RDW: 16.5 % — ABNORMAL HIGH (ref 11.0–15.0)
RDW: 16.4 % — ABNORMAL HIGH (ref 11.0–15.0)
WBC: 7.9 10*3/uL (ref 3.8–10.8)
WBC: 7.6 10*3/uL (ref 3.8–10.8)

## 2020-01-15 LAB — DHEA-SULFATE: DHEA-SO4: 40 ug/dL (ref 19–231)

## 2020-01-15 LAB — LIPID PANEL W/REFLEX DIRECT LDL
Cholesterol: 192 mg/dL (ref <200)
HDL: 31 mg/dL — ABNORMAL LOW (ref >=50)
LDL Cholesterol (Calc): 134 mg/dL (calc) — ABNORMAL HIGH
Non-HDL Cholesterol (Calc): 161 mg/dL (calc) — ABNORMAL HIGH (ref <130)
Total CHOL/HDL Ratio: 6.2 (calc) — ABNORMAL HIGH (ref <5.0)
Triglycerides: 138 mg/dL (ref <150)

## 2020-01-15 LAB — HEMOGLOBIN A1C
Hgb A1c MFr Bld: 5.4 % of total Hgb (ref <5.7)
Mean Plasma Glucose: 108 (calc)
eAG (mmol/L): 6 (calc)

## 2020-01-15 LAB — FOLLICLE STIMULATING HORMONE: FSH: 4.3 m[IU]/mL

## 2020-01-15 LAB — PROLACTIN: Prolactin: 9.6 ng/mL

## 2020-01-15 LAB — HCG, QUANTITATIVE, PREGNANCY: HCG, Total, QN: <3 m[IU]/mL

## 2020-01-15 LAB — TSH: TSH: 2.87 mIU/L

## 2020-01-15 LAB — LUTEINIZING HORMONE: LH: 0.4 m[IU]/mL — ABNORMAL LOW

## 2020-01-15 LAB — ESTRADIOL: Estradiol: 21 pg/mL

## 2020-01-15 NOTE — Telephone Encounter (Signed)
I have contacted Dr T about this. Sending note to him just as FYI and sign off on.

## 2020-01-17 ENCOUNTER — Observation Stay (HOSPITAL_BASED_OUTPATIENT_CLINIC_OR_DEPARTMENT_OTHER)
Admission: EM | Admit: 2020-01-17 | Discharge: 2020-01-18 | Disposition: A | Discharge status: Home / Self Care | Payer: No Typology Code available for payment source | Attending: Internal Medicine | Admitting: Internal Medicine

## 2020-01-17 ENCOUNTER — Encounter (HOSPITAL_BASED_OUTPATIENT_CLINIC_OR_DEPARTMENT_OTHER): Payer: Self-pay

## 2020-01-17 ENCOUNTER — Other Ambulatory Visit: Payer: Self-pay

## 2020-01-17 DIAGNOSIS — F410 Panic disorder [episodic paroxysmal anxiety] without agoraphobia: Secondary | ICD-10-CM | POA: Diagnosis not present

## 2020-01-17 DIAGNOSIS — N938 Other specified abnormal uterine and vaginal bleeding: Principal | ICD-10-CM | POA: Diagnosis present

## 2020-01-17 DIAGNOSIS — G894 Chronic pain syndrome: Secondary | ICD-10-CM | POA: Diagnosis present

## 2020-01-17 DIAGNOSIS — F320 Major depressive disorder, single episode, mild: Secondary | ICD-10-CM | POA: Diagnosis not present

## 2020-01-17 DIAGNOSIS — R42 Dizziness and giddiness: Secondary | ICD-10-CM | POA: Diagnosis present

## 2020-01-17 DIAGNOSIS — Z20822 Contact with and (suspected) exposure to covid-19: Secondary | ICD-10-CM | POA: Diagnosis not present

## 2020-01-17 DIAGNOSIS — D649 Anemia, unspecified: Secondary | ICD-10-CM | POA: Insufficient documentation

## 2020-01-17 DIAGNOSIS — F1120 Opioid dependence, uncomplicated: Secondary | ICD-10-CM | POA: Diagnosis present

## 2020-01-17 DIAGNOSIS — G43909 Migraine, unspecified, not intractable, without status migrainosus: Secondary | ICD-10-CM | POA: Insufficient documentation

## 2020-01-17 DIAGNOSIS — F329 Major depressive disorder, single episode, unspecified: Secondary | ICD-10-CM | POA: Insufficient documentation

## 2020-01-17 DIAGNOSIS — G43009 Migraine without aura, not intractable, without status migrainosus: Secondary | ICD-10-CM | POA: Diagnosis present

## 2020-01-17 DIAGNOSIS — Z6841 Body Mass Index (BMI) 40.0 and over, adult: Secondary | ICD-10-CM | POA: Diagnosis not present

## 2020-01-17 DIAGNOSIS — G43001 Migraine without aura, not intractable, with status migrainosus: Secondary | ICD-10-CM

## 2020-01-17 LAB — RETICULOCYTES
Immature Retic Fract: 24.1 % — ABNORMAL HIGH (ref 2.3–15.9)
RBC.: 4.03 MIL/uL (ref 3.87–5.11)
Retic Count, Absolute: 61.3 10*3/uL (ref 19.0–186.0)
Retic Ct Pct: 1.5 % (ref 0.4–3.1)

## 2020-01-17 LAB — CBC WITH DIFFERENTIAL/PLATELET
Abs Immature Granulocytes: 0.03 10*3/uL (ref 0.00–0.07)
Abs Immature Granulocytes: 0.03 10*3/uL (ref 0.00–0.07)
Basophils Absolute: 0 10*3/uL (ref 0.0–0.1)
Basophils Absolute: 0 10*3/uL (ref 0.0–0.1)
Basophils Relative: 0 %
Basophils Relative: 0 %
Eosinophils Absolute: 0.4 10*3/uL (ref 0.0–0.5)
Eosinophils Absolute: 0.4 10*3/uL (ref 0.0–0.5)
Eosinophils Relative: 5 %
Eosinophils Relative: 4 %
HCT: 25.5 % — ABNORMAL LOW (ref 36.0–46.0)
HCT: 24.6 % — ABNORMAL LOW (ref 36.0–46.0)
Hemoglobin: 6.6 g/dL — CL (ref 12.0–15.0)
Hemoglobin: 6.5 g/dL — CL (ref 12.0–15.0)
Immature Granulocytes: 0 %
Immature Granulocytes: 0 %
Lymphocytes Relative: 23 %
Lymphocytes Relative: 23 %
Lymphs Abs: 1.6 10*3/uL (ref 0.7–4.0)
Lymphs Abs: 2.2 10*3/uL (ref 0.7–4.0)
MCH: 16.3 pg — ABNORMAL LOW (ref 26.0–34.0)
MCH: 16.7 pg — ABNORMAL LOW (ref 26.0–34.0)
MCHC: 25.9 g/dL — ABNORMAL LOW (ref 30.0–36.0)
MCHC: 26.4 g/dL — ABNORMAL LOW (ref 30.0–36.0)
MCV: 63 fL — ABNORMAL LOW (ref 80.0–100.0)
MCV: 63.2 fL — ABNORMAL LOW (ref 80.0–100.0)
Monocytes Absolute: 0.4 10*3/uL (ref 0.1–1.0)
Monocytes Absolute: 0.7 10*3/uL (ref 0.1–1.0)
Monocytes Relative: 6 %
Monocytes Relative: 7 %
Neutro Abs: 4.6 10*3/uL (ref 1.7–7.7)
Neutro Abs: 6.1 10*3/uL (ref 1.7–7.7)
Neutrophils Relative %: 66 %
Neutrophils Relative %: 66 %
Platelets: 548 10*3/uL — ABNORMAL HIGH (ref 150–400)
Platelets: 575 10*3/uL — ABNORMAL HIGH (ref 150–400)
RBC: 4.05 MIL/uL (ref 3.87–5.11)
RBC: 3.89 MIL/uL (ref 3.87–5.11)
RDW: 17.1 % — ABNORMAL HIGH (ref 11.5–15.5)
RDW: 17.3 % — ABNORMAL HIGH (ref 11.5–15.5)
Smear Review: NORMAL
WBC: 7 10*3/uL (ref 4.0–10.5)
WBC: 9.4 10*3/uL (ref 4.0–10.5)
nRBC: 0 % (ref 0.0–0.2)
nRBC: 0 % (ref 0.0–0.2)

## 2020-01-17 LAB — COMPREHENSIVE METABOLIC PANEL
ALT: 13 U/L (ref 0–44)
AST: 10 U/L — ABNORMAL LOW (ref 15–41)
Albumin: 3.6 g/dL (ref 3.5–5.0)
Alkaline Phosphatase: 91 U/L (ref 38–126)
Anion gap: 9 (ref 5–15)
BUN: 13 mg/dL (ref 6–20)
CO2: 24 mmol/L (ref 22–32)
Calcium: 8.5 mg/dL — ABNORMAL LOW (ref 8.9–10.3)
Chloride: 104 mmol/L (ref 98–111)
Creatinine, Ser: 0.76 mg/dL (ref 0.44–1.00)
GFR calc Af Amer: >60 mL/min (ref >60)
GFR calc non Af Amer: >60 mL/min (ref >60)
Glucose, Bld: 97 mg/dL (ref 70–99)
Potassium: 3.5 mmol/L (ref 3.5–5.1)
Sodium: 137 mmol/L (ref 135–145)
Total Bilirubin: 0.3 mg/dL (ref 0.3–1.2)
Total Protein: 7.3 g/dL (ref 6.5–8.1)

## 2020-01-17 LAB — IRON AND TIBC
Iron: 12 ug/dL — ABNORMAL LOW (ref 28–170)
Saturation Ratios: 2 % — ABNORMAL LOW (ref 10.4–31.8)
TIBC: 503 ug/dL — ABNORMAL HIGH (ref 250–450)
UIBC: 491 ug/dL

## 2020-01-17 LAB — BASIC METABOLIC PANEL
Anion gap: 11 (ref 5–15)
BUN: 10 mg/dL (ref 6–20)
CO2: 23 mmol/L (ref 22–32)
Calcium: 8.3 mg/dL — ABNORMAL LOW (ref 8.9–10.3)
Chloride: 103 mmol/L (ref 98–111)
Creatinine, Ser: 0.7 mg/dL (ref 0.44–1.00)
GFR calc Af Amer: >60 mL/min (ref >60)
GFR calc non Af Amer: >60 mL/min (ref >60)
Glucose, Bld: 111 mg/dL — ABNORMAL HIGH (ref 70–99)
Potassium: 3.8 mmol/L (ref 3.5–5.1)
Sodium: 137 mmol/L (ref 135–145)

## 2020-01-17 LAB — MAGNESIUM: Magnesium: 2.2 mg/dL (ref 1.7–2.4)

## 2020-01-17 LAB — TESTOS,TOTAL,FREE AND SHBG (FEMALE)
Free Testosterone: 0.8 pg/mL (ref 0.1–6.4)
Sex Hormone Binding: 36 nmol/L (ref 17–124)
Testosterone, Total, LC-MS-MS: 7 ng/dL (ref 2–45)

## 2020-01-17 LAB — VITAMIN B12: Vitamin B-12: 288 pg/mL (ref 180–914)

## 2020-01-17 LAB — SARS CORONAVIRUS 2 BY RT PCR (HOSPITAL ORDER, PERFORMED IN ~~LOC~~ HOSPITAL LAB): SARS Coronavirus 2: NEGATIVE

## 2020-01-17 LAB — PREPARE RBC (CROSSMATCH)

## 2020-01-17 LAB — PREGNANCY, URINE: Preg Test, Ur: NEGATIVE

## 2020-01-17 LAB — FERRITIN: Ferritin: 3 ng/mL — ABNORMAL LOW (ref 11–307)

## 2020-01-17 LAB — TSH: TSH: 2.526 u[IU]/mL (ref 0.350–4.500)

## 2020-01-17 LAB — FOLATE: Folate: 9.2 ng/mL (ref >5.9)

## 2020-01-17 MED ORDER — ACETAMINOPHEN 325 MG PO TABS
650.0000 mg | ORAL_TABLET | Freq: Four times a day (QID) | ORAL | Status: DC | PRN
  Administered 2020-01-17: 650 mg via ORAL
  Filled 2020-01-17: qty 2

## 2020-01-17 MED ORDER — SODIUM CHLORIDE 0.9% IV SOLUTION: Freq: Once | INTRAVENOUS | Status: DC

## 2020-01-17 MED ORDER — LAMOTRIGINE 25 MG PO TABS
50.0000 mg | ORAL_TABLET | Freq: Two times a day (BID) | ORAL | Status: DC
  Filled 2020-01-17 (×2): qty 2

## 2020-01-17 MED ORDER — ALBUTEROL SULFATE HFA 108 (90 BASE) MCG/ACT IN AERS: 2.0000 | INHALATION_SPRAY | Freq: Four times a day (QID) | RESPIRATORY_TRACT | Status: DC | PRN

## 2020-01-17 MED ORDER — TRAZODONE HCL 50 MG PO TABS
50.0000 mg | ORAL_TABLET | Freq: Every evening | ORAL | Status: DC | PRN
  Administered 2020-01-17: 50 mg via ORAL
  Filled 2020-01-17: qty 1

## 2020-01-17 MED ORDER — MEDROXYPROGESTERONE ACETATE 10 MG PO TABS
20.0000 mg | ORAL_TABLET | Freq: Every day | ORAL | Status: DC
  Administered 2020-01-18: 20 mg via ORAL
  Filled 2020-01-17: qty 2

## 2020-01-17 MED ORDER — ACETAMINOPHEN 650 MG RE SUPP: 650.0000 mg | Freq: Four times a day (QID) | RECTAL | Status: DC | PRN

## 2020-01-17 MED ORDER — TOPIRAMATE ER 50 MG PO CAP24: 1.0000 | ORAL_CAPSULE | Freq: Every day | ORAL | Status: DC

## 2020-01-17 MED ORDER — VENLAFAXINE HCL ER 75 MG PO CP24
225.0000 mg | ORAL_CAPSULE | Freq: Every day | ORAL | Status: DC
  Administered 2020-01-18: 225 mg via ORAL
  Filled 2020-01-17: qty 1

## 2020-01-17 MED ORDER — HYDROXYZINE HCL 25 MG PO TABS
50.0000 mg | ORAL_TABLET | Freq: Every day | ORAL | Status: DC
  Administered 2020-01-17: 50 mg via ORAL
  Filled 2020-01-17: qty 2

## 2020-01-17 MED ORDER — CYCLOBENZAPRINE HCL 10 MG PO TABS: 10.0000 mg | ORAL_TABLET | Freq: Three times a day (TID) | ORAL | Status: DC | PRN

## 2020-01-17 MED ORDER — PANTOPRAZOLE SODIUM 40 MG PO TBEC
40.0000 mg | DELAYED_RELEASE_TABLET | Freq: Every day | ORAL | Status: DC
  Administered 2020-01-17 – 2020-01-18 (×2): 40 mg via ORAL
  Filled 2020-01-17 (×2): qty 1

## 2020-01-17 NOTE — ED Notes (Signed)
Pt amb to [facility]

## 2020-01-17 NOTE — ED Notes (Signed)
Provided lunch that husband brought to pt; no other needs voiced at this time.

## 2020-01-17 NOTE — ED Provider Notes (Signed)
[facility] EMERGENCY DEPARTMENT Provider Note   CSN: [id number] Arrival date & time: 01/17/20  0745     History Chief Complaint  Patient presents with  . Abnormal Lab    Regina [name] is a 44 y.o. female.  Patient here with weakness, dizziness, lightheadedness.  Had blood work done last week that showed she had a hemoglobin of 6.6 but did not want to come to the emergency room sooner for transfusion.  Continues to have some light menstrual bleeding but had heavy bleeding last week.  This has been an issue for the last several months.  She is scheduled for D&C for thickened endometrium.  She denies any black stools or bloody stools.  States that she is very weak, more pale.  The history is provided by the patient.  Illness Severity:  Mild Onset quality:  Gradual Timing:  Constant Progression:  Worsening Chronicity:  New Relieved by:  Nothing Worsened by:  Nothing Associated symptoms: no abdominal pain, no chest pain, no cough, no ear pain, no fever, no rash, no shortness of breath, no sore throat and no vomiting        Past Medical History:  Diagnosis Date  . Depression   . Depression with anxiety   . Migraine   . Morbid obesity ([place])   . Panic disorder   . Seizures [facility])     Patient Active Problem List   Diagnosis Date Noted  . Microcytic anemia 10/24/2018  . GERD (gastroesophageal reflux disease) 09/29/2018  . Shingles 12/31/2017  . 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 Severe episode of recurrent major depressive disorder, without psychotic features ([place])   . Opioid use disorder, moderate, dependence ([place]) 10/20/2015  . Chronic pain syndrome 10/20/2015  . Low back pain 07/22/2015  . Excessive daytime sleepiness 10/28/2014  . Left cervical radiculopathy 04/02/2014  . Insomnia 12/01/2013  . Migraine without aura 08/04/2013  . Hyperlipidemia 03/11/2012  . Annual physical exam 03/10/2012  . Obesity 03/10/2012  . TERATOMA 03/07/2010    Past Surgical History:    Procedure Laterality Date  . 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 DILATION AND CURETTAGE, DIAGNOSTIC / THERAPEUTIC    . SOFT TISSUE TUMOR RESECTION  06/17/07   chest/ wound reclosure 4/09'     OB History    Gravida  5   Para  3   Term  3   Preterm      AB  0   Living  3     SAB      TAB      Ectopic      Multiple      Live Births              Family History  Problem Relation Age of Onset  . Hypertension Mother   . Migraines Mother   . Depression Father   . Suicidality Father     Social History   Tobacco Use  . Smoking status: Never Smoker  . Smokeless tobacco: Never Used  Vaping Use  . Vaping Use: Never used  Substance Use Topics  . Alcohol use: No  . Drug use: No    Home Medications Prior to Admission medications   Medication Sig Start Date End Date Taking? Authorizing Provider  albuterol (VENTOLIN HFA) 108 (90 Base) MCG/ACT inhaler Inhale 2 puffs into the lungs every 6 (six) hours as needed for wheezing or shortness of breath. 10/27/18   Lacy Duverney M, PA-C  cyclobenzaprine (FLEXERIL) 10 MG tablet  Take 1 tablet (10 mg total) by mouth 3 (three) times daily as needed for muscle spasms. 01/06/20   Silverio Decamp, MD  hydrOXYzine (ATARAX/VISTARIL) 50 MG tablet TAKE 1 TABLET (50 MG TOTAL) BY MOUTH AT BEDTIME. NO FURTHER REFILLS WITHOUT APPT- CAN BE VIRTUAL 11/30/19   Silverio Decamp, MD  lamoTRIgine (LAMICTAL) 25 MG tablet Take 2 tablets (50 mg total) by mouth 2 (two) times daily. 06/11/19   Silverio Decamp, MD  medroxyPROGESTERone (PROVERA) 10 MG tablet TAKE 2 TABLETS BY MOUTH EVERY DAY 01/13/20   Sloan Leiter, MD  ondansetron [facility] ODT) 4 MG disintegrating tablet 4mg  ODT q4 hours prn nausea/vomit 06/02/18   Leeroy Cha O, PA-C  pantoprazole (PROTONIX) 40 MG tablet Take 1 tablet (40 mg total) by mouth daily. 09/14/19   Silverio Decamp, MD  rizatriptan (MAXALT-MLT) 5 MG disintegrating tablet Take 1 tablet (5 mg total) by  mouth as needed for migraine. May repeat in 2 hours if needed 12/31/17   Silverio Decamp, MD  traZODone (DESYREL) 50 MG tablet TAKE 1 TABLET BY MOUTH EVERYDAY AT BEDTIME 11/30/19   Silverio Decamp, MD  TROKENDI XR 50 MG CP24 TAKE 1 CAPSULE BY MOUTH EVERY DAY 12/26/17   Silverio Decamp, MD  venlafaxine XR (EFFEXOR-XR) 75 MG 24 hr capsule TAKE 3 CAPSULES (225 MG TOTAL) BY MOUTH DAILY WITH BREAKFAST. 12/14/19   Silverio Decamp, MD    Allergies    Codeine and Sumatriptan  Review of Systems   Review of Systems  Constitutional: Negative for chills and fever.  HENT: Negative for ear pain and sore throat.   Eyes: Negative for pain and visual disturbance.  Respiratory: Negative for cough and shortness of breath.   Cardiovascular: Negative for chest pain and palpitations.  Gastrointestinal: Negative for abdominal pain and vomiting.  Genitourinary: Negative for dysuria and hematuria.  Musculoskeletal: Negative for arthralgias and back pain.  Skin: Positive for pallor. Negative for color change and rash.  Neurological: Negative for seizures and syncope.  All other systems reviewed and are negative.   Physical Exam Updated Vital Signs  ED Triage Vitals  Enc Vitals Group     BP 01/17/20 0813 (!) 156/82     Pulse Rate 01/17/20 0813 92     Resp 01/17/20 0813 19     Temp 01/17/20 0813 98.9 F (37.2 C)     Temp Source 01/17/20 0813 Oral     SpO2 01/17/20 0813 100 %     Weight 01/17/20 0754 295 lb (133.8 kg)     Height 01/17/20 0754 5\' 3"  (1.6 m)     Head Circumference --      Peak Flow --      Pain Score 01/17/20 0753 0     Pain Loc --      Pain Edu? --      Excl. in [place]? --      Physical Exam Vitals and nursing note reviewed.  Constitutional:      General: She is not in acute distress.    Appearance: She is well-developed. She is not ill-appearing.  HENT:     Head: Normocephalic and atraumatic.     Nose: Nose normal.     Mouth/Throat:     Mouth: Mucous  membranes are moist.  Eyes:     Extraocular Movements: Extraocular movements intact.     Conjunctiva/sclera: Conjunctivae normal.     Pupils: Pupils are equal, round, and reactive to light.  Comments: Pale conjuctiva   Cardiovascular:     Rate and Rhythm: Normal rate and regular rhythm.     Pulses: Normal pulses.     Heart sounds: Normal heart sounds. No murmur heard.   Pulmonary:     Effort: Pulmonary effort is normal. No respiratory distress.     Breath sounds: Normal breath sounds.  Abdominal:     Palpations: Abdomen is soft.     Tenderness: There is no abdominal tenderness.  Genitourinary:    Rectum: Guaiac stool: deferred per patient preference.  Musculoskeletal:     Cervical back: Normal range of motion and neck supple.  Skin:    General: Skin is warm and dry.     Capillary Refill: Capillary refill takes less than 2 seconds.     Coloration: Skin is pale.  Neurological:     General: No focal deficit present.     Mental Status: She is alert.  Psychiatric:        Mood and Affect: Mood normal.     ED Results / Procedures / Treatments   Labs (all labs ordered are listed, but only abnormal results are displayed) Labs Reviewed  CBC WITH DIFFERENTIAL/PLATELET - Abnormal; Notable for the following components:      Result Value   Hemoglobin 6.6 (*)    HCT 25.5 (*)    MCV 63.0 (*)    MCH 16.3 (*)    MCHC 25.9 (*)    RDW 17.1 (*)    Platelets 548 (*)    All other components within normal limits  BASIC METABOLIC PANEL - Abnormal; Notable for the following components:   Glucose, Bld 111 (*)    Calcium 8.3 (*)    All other components within normal limits  SARS CORONAVIRUS 2 BY RT PCR (HOSPITAL ORDER, [facility])  PREGNANCY, URINE  OCCULT BLOOD X 1 CARD TO LAB, STOOL  VITAMIN B12  FOLATE  IRON AND TIBC  FERRITIN  RETICULOCYTES    EKG EKG Interpretation  Date/Time:  Sunday January 17 2020 07:59:20 EDT Ventricular Rate:  91 PR  Interval:    QRS Duration: 83 QT Interval:  378 QTC Calculation: 466 R Axis:   59 Text Interpretation: Normal sinus rhythm Low voltage, precordial leads Confirmed by Lennice Sites [phone]) on 01/17/2020 8:04:41 AM   Radiology No results found.  Procedures .Critical Care Performed by: Lennice Sites, DO Authorized by: Lennice Sites, DO   Critical care provider statement:    Critical care time (minutes):  35   Critical care was necessary to treat or prevent imminent or life-threatening deterioration of the following conditions:  Circulatory failure   Critical care was time spent personally by me on the following activities:  Blood draw for specimens, development of treatment plan with patient or surrogate, discussions with primary provider, evaluation of patient's response to treatment, examination of patient, obtaining history from patient or surrogate, ordering and performing treatments and interventions, ordering and review of laboratory studies, ordering and review of radiographic studies, re-evaluation of patient's condition, pulse oximetry and review of old charts   I assumed direction of critical care for this patient from another provider in my specialty: no     (including critical care time)  Medications Ordered in ED Medications - No data to display  ED Course  I have reviewed the triage vital signs and the nursing notes.  Pertinent labs & imaging results that were available during my care of the patient were reviewed by me  and considered in my medical decision making (see chart for details).    MDM Rules/Calculators/A&P                          Regina [name] is a 44 year old female with history of migraine, depression, seizures who presents to the ED with weakness, lightheadedness, fatigue.  Patient with unremarkable vitals.  No fever.  Has had abnormal uterine bleeding for the last several months.  Had blood work checked last week and had a hemoglobin of 6.6.  Did not  want to come for blood transfusion or was unable to get a blood transfusion arranged outpatient.  Has become more symptomatic with weakness and fatigue and feel like she is in a pass out.  Denies any black stools or bloody stools.  Patient declined rectal exam.  She had heavy menstrual bleeding last week and has had some light bleeding the last few days.  Denies any chest pain or shortness of breath.  We will get blood work including anemia panel.  Presume that patient has symptomatic anemia.  Hemoglobin 6.6.  MCV less than 70.  This is likely from iron deficiency anemia.  However now patient becoming more symptomatic and believe she would benefit from blood transfusion.  Otherwise blood work unremarkable.  Iron panel is pending.  To be admitted for blood transfusion and further anemia work-up.  This chart was dictated using voice recognition software.  Despite best efforts to proofread,  errors can occur which can change the documentation meaning.    Final Clinical Impression(s) / ED Diagnoses Final diagnoses:  Symptomatic anemia    Rx / DC Orders ED Discharge Orders    None       Lennice Sites, DO 01/17/20 8325

## 2020-01-17 NOTE — ED Notes (Signed)
Date and time results received: 01/17/20 0850  Test: hgb Critical Value: 6.6  Name of Provider Notified: [place] Orders Received? Or Actions Taken?: no orders given

## 2020-01-17 NOTE — ED Triage Notes (Signed)
Pt arrives with c/o abnormal labs states that she has been having abnormal vaginal bleeding which is causing her hemoglobin to drop. Pt reports feeling symptomatic of it with dizziness and tiredness.

## 2020-01-17 NOTE — H&P (Addendum)
[facility] History and Physical  Regina [name] ZOX:[id number] DOB: May 11, 1975 DOA: 01/17/2020  Referring physician: [facility] PCP: Silverio Decamp, MD   Chief Complaint: weakness, lightheadedness, fatigue HPI: Regina [name] is a 44 y.o. WF PMHx  major depressive disorder, anxiety, panic disorder, seizures, morbid obesity, chronic pain syndrome, opioid use disorder, migraine,  Has had abnormal uterine bleeding for the last several months.  Had blood work checked last week and had a hemoglobin of 6.6.  Did not want to come for blood transfusion or was unable to get a blood transfusion arranged outpatient.  Has become more symptomatic with weakness and fatigue and feel like she is in a pass out.  Denies any black stools or bloody stools.  Patient declined rectal exam.  She had heavy menstrual bleeding last week and has had some light bleeding the last few days.  9/19 urine pregnancy test negative -Patient states has been diagnosed by her OB/GYN doctor Vivien Rota with an extremely thick endometrium and needs immediate D&C.  Review of Systems:  Covid vaccination;  Constitutional:  No weight loss, night sweats, Fevers, chills, fatigue.  HEENT:  No headaches, Difficulty swallowing,Tooth/dental problems,Sore throat,  No sneezing, itching, ear ache, nasal congestion, post nasal drip,  Cardio-vascular:  No chest pain, Orthopnea, PND, swelling in lower extremities, anasarca, dizziness, palpitations  GI:  No heartburn, indigestion, abdominal pain, nausea, vomiting, diarrhea, change in bowel habits, loss of appetite  Resp:  No shortness of breath with exertion or at rest. No excess mucus, no productive cough, No non-productive cough, No coughing up of blood.No change in color of mucus.No wheezing.No chest wall deformity  Skin:  no rash or lesions.  GU:  no dysuria, change in color of urine, no urgency or frequency. No flank pain.  Musculoskeletal:  No joint pain or  swelling. No decreased range of motion. No back pain.  Psych:  No change in mood or affect.  Positive depression or anxiety. No memory loss.   Past Medical History:  Diagnosis Date  . Depression   . Depression with anxiety   . Migraine   . Morbid obesity ([place])   . Panic disorder   . Seizures ([place])    Past Surgical History:  Procedure Laterality Date  . 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 DILATION AND CURETTAGE, DIAGNOSTIC / THERAPEUTIC    . SOFT TISSUE TUMOR RESECTION  06/17/07   chest/ wound reclosure 4/09'   Social History:  reports that she has never smoked. She has never used smokeless tobacco. She reports that she does not drink alcohol and does not use drugs.  Allergies  Allergen Reactions  . Codeine Itching  . Sumatriptan     Palpitations    Family History  Problem Relation Age of Onset  . Hypertension Mother   . Migraines Mother   . Depression Father   . Suicidality Father     Prior to Admission medications   Medication Sig Start Date End Date Taking? Authorizing Provider  albuterol (VENTOLIN HFA) 108 (90 Base) MCG/ACT inhaler Inhale 2 puffs into the lungs every 6 (six) hours as needed for wheezing or shortness of breath. 10/27/18   Lacy Duverney M, PA-C  cyclobenzaprine (FLEXERIL) 10 MG tablet Take 1 tablet (10 mg total) by mouth 3 (three) times daily as needed for muscle spasms. 01/06/20   Silverio Decamp, MD  hydrOXYzine (ATARAX/VISTARIL) 50 MG tablet TAKE 1 TABLET (50 MG TOTAL) BY MOUTH AT BEDTIME. NO FURTHER  REFILLS WITHOUT APPT- CAN BE VIRTUAL 11/30/19   Silverio Decamp, MD  lamoTRIgine (LAMICTAL) 25 MG tablet Take 2 tablets (50 mg total) by mouth 2 (two) times daily. 06/11/19   Silverio Decamp, MD  medroxyPROGESTERone (PROVERA) 10 MG tablet TAKE 2 TABLETS BY MOUTH EVERY DAY 01/13/20   Sloan Leiter, MD  ondansetron [facility] ODT) 4 MG disintegrating tablet 4mg  ODT q4 hours prn nausea/vomit 06/02/18   Noe Gens, PA-C  pantoprazole  (PROTONIX) 40 MG tablet Take 1 tablet (40 mg total) by mouth daily. 09/14/19   Silverio Decamp, MD  rizatriptan (MAXALT-MLT) 5 MG disintegrating tablet Take 1 tablet (5 mg total) by mouth as needed for migraine. May repeat in 2 hours if needed 12/31/17   Silverio Decamp, MD  traZODone (DESYREL) 50 MG tablet TAKE 1 TABLET BY MOUTH EVERYDAY AT BEDTIME 11/30/19   Silverio Decamp, MD  TROKENDI XR 50 MG CP24 TAKE 1 CAPSULE BY MOUTH EVERY DAY 12/26/17   Silverio Decamp, MD  venlafaxine XR (EFFEXOR-XR) 75 MG 24 hr capsule TAKE 3 CAPSULES (225 MG TOTAL) BY MOUTH DAILY WITH BREAKFAST. 12/14/19   Silverio Decamp, MD     Consultants:    Procedures/Significant Events:    I have personally reviewed and interpreted all radiology studies and my findings are as above.   VENTILATOR SETTINGS:    Cultures 9/19 SARS coronavirus negative  Antimicrobials:    Devices    LINES / TUBES:      Continuous Infusions:  Physical Exam: Vitals:   01/17/20 1130 01/17/20 1300 01/17/20 1400 01/17/20 1724  BP: 133/77 121/73 131/70 (!) 150/93  Pulse: 76 87 86 93  Resp: 18 (!) 31 18   Temp:    98.7 F (37.1 C)  TempSrc:    Oral  SpO2: 98% 97% 95% 100%  Weight:      Height:        Wt Readings from Last 3 Encounters:  01/17/20 133.8 kg  01/14/20 132.9 kg  12/07/19 135.6 kg    General: A/O x4 No acute respiratory distress Eyes: negative scleral hemorrhage, negative anisocoria, negative icterus ENT: Negative Runny nose, negative gingival bleeding, Neck:  Negative scars, masses, torticollis, lymphadenopathy, JVD Lungs: Clear to auscultation bilaterally without wheezes or crackles Cardiovascular: Regular rate and rhythm without murmur gallop or rub normal S1 and S2 Abdomen: MORBIDLY OBESE abdominal pain, nondistended, positive soft, bowel sounds, no rebound, no ascites, no appreciable mass Extremities: No significant cyanosis, clubbing, or edema bilateral lower  extremities Skin: Negative rashes, lesions, ulcers Psychiatric:  Negative depression, negative anxiety, negative fatigue, negative mania  Central nervous system:  Cranial nerves II through XII intact, tongue/uvula midline, all extremities muscle strength 5/5, sensation intact throughout, negative dysarthria, negative expressive aphasia, negative receptive aphasia.        Labs on Admission:  Basic Metabolic Panel: Recent Labs  Lab 01/14/20 0713 01/17/20 0808  NA 139 137  K 4.3 3.8  CL 105 103  CO2 23 23  GLUCOSE 76 111*  BUN 12 10  CREATININE 0.72 0.70  CALCIUM 8.4* 8.3*   Liver Function Tests: Recent Labs  Lab 01/14/20 0713  AST 9*  ALT 11  BILITOT 0.2  PROT 6.4   No results for input(s): LIPASE, AMYLASE in the last 168 hours. No results for input(s): AMMONIA in the last 168 hours. CBC: Recent Labs  Lab 01/14/20 0713 01/14/20 0717 01/17/20 0808  WBC 7.9 7.6 7.0  NEUTROABS  --   --  4.6  HGB 6.5* 6.6* 6.6*  HCT 24.5* 24.4* 25.5*  MCV 61.6* 61.5* 63.0*  PLT 499* 519* 548*   Cardiac Enzymes: No results for input(s): CKTOTAL, CKMB, CKMBINDEX, TROPONINI in the last 168 hours.  BNP (last 3 results) No results for input(s): BNP in the last 8760 hours.  ProBNP (last 3 results) No results for input(s): PROBNP in the last 8760 hours.  CBG: No results for input(s): GLUCAP in the last 168 hours.  Radiological Exams on Admission: No results found.  EKG: Independently reviewed.   Assessment/Plan Active Problems:   Migraine without aura   Opioid use disorder, moderate, dependence (HCC)   Chronic pain syndrome   Symptomatic anemia   Dysfunctional uterine bleeding   Morbid obesity with BMI of 50.0-59.9, adult (HCC)   Major depressive disorder   Panic disorder  Dysfunctional uterine bleed/Symptomatic anemia -Ttransfuse 2 units PRBC -In the a.m. contact Dr. Vivien Rota OB/GYN to determine if she would like to keep patient admitted and perform D&C while patient  currently admitted.  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              Patient currently unstable     Data Reviewed: Care during the described time interval was provided by me .  I have reviewed this patient's available data, including medical history, events of note, physical examination, and all test results as part of my evaluation.   The patient is critically ill with multiple organ systems failure and requires high complexity decision making for assessment and support, frequent evaluation and titration of therapies, application of advanced monitoring technologies and extensive interpretation of multiple databases. Critical Care Time devoted to patient care services described in this note  Time spent: 77 minutes   Helen Winterhalter, [facility] Pager [phone]

## 2020-01-18 ENCOUNTER — Encounter: Payer: Self-pay | Admitting: Obstetrics and Gynecology

## 2020-01-18 ENCOUNTER — Encounter: Payer: No Typology Code available for payment source | Admitting: Sports Medicine

## 2020-01-18 DIAGNOSIS — D649 Anemia, unspecified: Secondary | ICD-10-CM | POA: Diagnosis not present

## 2020-01-18 DIAGNOSIS — F321 Major depressive disorder, single episode, moderate: Secondary | ICD-10-CM | POA: Diagnosis not present

## 2020-01-18 DIAGNOSIS — G894 Chronic pain syndrome: Secondary | ICD-10-CM | POA: Diagnosis not present

## 2020-01-18 DIAGNOSIS — N938 Other specified abnormal uterine and vaginal bleeding: Secondary | ICD-10-CM | POA: Diagnosis not present

## 2020-01-18 LAB — BASIC METABOLIC PANEL
Anion gap: 9 (ref 5–15)
BUN: 14 mg/dL (ref 6–20)
CO2: 24 mmol/L (ref 22–32)
Calcium: 8.4 mg/dL — ABNORMAL LOW (ref 8.9–10.3)
Chloride: 104 mmol/L (ref 98–111)
Creatinine, Ser: 0.78 mg/dL (ref 0.44–1.00)
GFR calc Af Amer: >60 mL/min (ref >60)
GFR calc non Af Amer: >60 mL/min (ref >60)
Glucose, Bld: 88 mg/dL (ref 70–99)
Potassium: 4 mmol/L (ref 3.5–5.1)
Sodium: 137 mmol/L (ref 135–145)

## 2020-01-18 LAB — MAGNESIUM: Magnesium: 2.4 mg/dL (ref 1.7–2.4)

## 2020-01-18 LAB — CBC
HCT: 29.1 % — ABNORMAL LOW (ref 36.0–46.0)
Hemoglobin: 8.1 g/dL — ABNORMAL LOW (ref 12.0–15.0)
MCH: 19.2 pg — ABNORMAL LOW (ref 26.0–34.0)
MCHC: 27.8 g/dL — ABNORMAL LOW (ref 30.0–36.0)
MCV: 69 fL — ABNORMAL LOW (ref 80.0–100.0)
Platelets: 524 10*3/uL — ABNORMAL HIGH (ref 150–400)
RBC: 4.22 MIL/uL (ref 3.87–5.11)
RDW: 22.5 % — ABNORMAL HIGH (ref 11.5–15.5)
WBC: 8.2 10*3/uL (ref 4.0–10.5)
nRBC: 0 % (ref 0.0–0.2)

## 2020-01-18 LAB — HIV ANTIBODY (ROUTINE TESTING W REFLEX): HIV Screen 4th Generation wRfx: NONREACTIVE

## 2020-01-18 LAB — APTT: aPTT: 29 seconds (ref 24–36)

## 2020-01-18 LAB — PROTIME-INR
INR: 1.1 (ref 0.8–1.2)
Prothrombin Time: 13.6 seconds (ref 11.4–15.2)

## 2020-01-18 NOTE — Discharge Summary (Signed)
Physician Discharge Summary  Regina [name] TKW:[id number] DOB: [birth year] DOA: 01/17/2020  PCP: Silverio Decamp, MD  Admit date: 01/17/2020 Discharge date: 01/18/2020  Time spent: 35 minutes  Recommendations for Outpatient Follow-up:   Dysfunctional uterine bleed/Symptomatic anemia -Ttransfuse 2 units PRBC -In the a.m. contact Dr. Vivien Rota OB/GYN to determine if she would like to keep patient admitted and perform D&C while patient currently admitted.  Patient states works out of [employer] within [employer] system -Provera 20 mg daily -9/20 discussed case with Dr. Vivien Rota OB/GYN who stated that it was okay to discharge patient since she was not actively bleeding because she would not urgently perform D&C.  She will contact patient with follow-up appointment.  Patient is to contact her office if she begins to have serious vaginal bleeding.  Chronic pain syndrome -Flexeril 10 mg TID -Trazodone 50 mg  Major depressive disorder/panic disorder -Venlafaxine ER 225 mg daily - Seizures -Lamictal 50 mg BID  Migraine -Topiramate daily  Discharge Diagnoses:  Active Problems:   Migraine without aura   Opioid use disorder, moderate, dependence (HCC)   Chronic pain syndrome   Symptomatic anemia   Dysfunctional uterine bleeding   Morbid obesity with BMI of 50.0-59.9, adult ([place])   Major depressive disorder   Panic disorder   Discharge Condition: Stable  Diet recommendation: Heart healthy  Filed Weights   01/17/20 0754  Weight: 133.8 kg    History of present illness:  Regina [name] is a 44 y.o. WF PMHx major depressive disorder, anxiety, panic disorder, seizures, morbid obesity, chronic pain syndrome, opioid use disorder, migraine,  Has had abnormal uterine bleeding for the last several months. Had blood work checked last week and had a hemoglobin of 6.6. Did not want to come for blood transfusion or was unable to get a blood transfusion arranged outpatient. Has  become more symptomatic with weakness and fatigue and feel like she is in a pass out. Denies any black stools or bloody stools. Patient declined rectal exam. She had heavy menstrual bleeding last week and has had some light bleeding the last few days.  9/19 urine pregnancy test negative -Patient states has been diagnosed by her OB/GYN doctor Vivien Rota with an extremely thick endometrium and needs immediate D&C.  Hospital Course:  See above  Procedures: 9/19 transfused 2 units PRBC  Consultations: Phone consult Dr. Vivien Rota OB/GYN    Discharge Exam: Vitals:   01/18/20 0128 01/18/20 0128 01/18/20 0342 01/18/20 1241  BP: 123/68 123/68 138/82 (!) 143/93  Pulse: 82 82 77 86  Resp: 20 20 20 15   Temp: 98.5 F (36.9 C) 98.5 F (36.9 C) 98.2 F (36.8 C) 97.6 F (36.4 C)  TempSrc: Oral Oral Oral Oral  SpO2: 98% 98% 100% 95%  Weight:      Height:        General: A/O x4 No acute respiratory distress Eyes: negative scleral hemorrhage, negative anisocoria, negative icterus ENT: Negative Runny nose, negative gingival bleeding, Neck:  Negative scars, masses, torticollis, lymphadenopathy, JVD Lungs: Clear to auscultation bilaterally without wheezes or crackles Cardiovascular: Regular rate and rhythm without murmur gallop or rub normal S1 and S2 Abdomen: MORBIDLY OBESE abdominal pain, nondistended, positive soft, bowel sounds, no rebound, no ascites, no appreciable mass   Discharge Instructions   Allergies as of 01/18/2020      Reactions   Codeine Itching   Sumatriptan Other (See Comments)   Palpitations      Medication List    STOP taking these  medications   ibuprofen 200 MG tablet Commonly known as: ADVIL     TAKE these medications   albuterol 108 (90 Base) MCG/ACT inhaler Commonly known as: VENTOLIN HFA Inhale 2 puffs into the lungs every 6 (six) hours as needed for wheezing or shortness of breath.   cyclobenzaprine 10 MG tablet Commonly known as: FLEXERIL Take  1 tablet (10 mg total) by mouth 3 (three) times daily as needed for muscle spasms.   hydrOXYzine 50 MG tablet Commonly known as: ATARAX/VISTARIL TAKE 1 TABLET (50 MG TOTAL) BY MOUTH AT BEDTIME. NO FURTHER REFILLS WITHOUT APPT- CAN BE VIRTUAL   lamoTRIgine 25 MG tablet Commonly known as: LaMICtal Take 2 tablets (50 mg total) by mouth 2 (two) times daily.   medroxyPROGESTERone 10 MG tablet Commonly known as: PROVERA TAKE 2 TABLETS BY MOUTH EVERY DAY   ondansetron 4 MG disintegrating tablet Commonly known as: Zofran ODT 4mg  ODT q4 hours prn nausea/vomit What changed:   how much to take  how to take this  when to take this  reasons to take this   pantoprazole 40 MG tablet Commonly known as: PROTONIX Take 1 tablet (40 mg total) by mouth daily.   rizatriptan 5 MG disintegrating tablet Commonly known as: Maxalt-MLT Take 1 tablet (5 mg total) by mouth as needed for migraine. May repeat in 2 hours if needed What changed:   when to take this  additional instructions   traZODone 50 MG tablet Commonly known as: DESYREL TAKE 1 TABLET BY MOUTH EVERYDAY AT BEDTIME What changed: See the new instructions.   Trokendi XR 50 MG Cp24 Generic drug: Topiramate ER TAKE 1 CAPSULE BY MOUTH EVERY DAY   venlafaxine XR 75 MG 24 hr capsule Commonly known as: EFFEXOR-XR TAKE 3 CAPSULES (225 MG TOTAL) BY MOUTH DAILY WITH BREAKFAST.      Allergies  Allergen Reactions  . Codeine Itching  . Sumatriptan Other (See Comments)    Palpitations      The results of significant diagnostics from this hospitalization (including imaging, microbiology, ancillary and laboratory) are listed below for reference.    Significant Diagnostic Studies: No results found.  Microbiology: Recent Results (from the past 240 hour(s))  SARS Coronavirus 2 by RT PCR (hospital order, performed in [facility] hospital lab) Nasopharyngeal Nasopharyngeal Swab     Status: None   Collection Time: 01/17/20  8:10 AM    Specimen: Nasopharyngeal Swab  Result Value Ref Range Status   SARS Coronavirus 2 NEGATIVE NEGATIVE Final    Comment: (NOTE) SARS-CoV-2 target nucleic acids are NOT DETECTED.  The SARS-CoV-2 RNA is generally detectable in upper and lower respiratory specimens during the acute phase of infection. The lowest concentration of SARS-CoV-2 viral copies this assay can detect is 250 copies / mL. A negative result does not preclude SARS-CoV-2 infection and should not be used as the sole basis for treatment or other patient management decisions.  A negative result may occur with improper specimen collection / handling, submission of specimen other than nasopharyngeal swab, presence of viral mutation(s) within the areas targeted by this assay, and inadequate number of viral copies (<250 copies / mL). A negative result must be combined with clinical observations, patient history, and epidemiological information.  Fact Sheet for Patients:   StrictlyIdeas.no  Fact Sheet for Healthcare Providers: BankingDealers.co.za  This test is not yet approved or  cleared by the [place] FDA and has been authorized for detection and/or diagnosis of SARS-CoV-2 by FDA under an Emergency Use Authorization (  EUA).  This EUA will remain in effect (meaning this test can be used) for the duration of the COVID-19 declaration under Section 564(b)(1) of the Act, 21 U.S.C. section 360bbb-3(b)(1), unless the authorization is terminated or revoked sooner.  Performed at [facility], [address] [place], [place] [address]      Labs: Basic Metabolic Panel: Recent Labs  Lab 01/14/20 0713 01/17/20 0808 01/17/20 1921 01/18/20 0823  NA 139 137 137 137  K 4.3 3.8 3.5 4.0  CL 105 103 104 104  CO2 23 23 24 24   GLUCOSE 76 111* 97 88  BUN 12 10 13 14   CREATININE 0.72 0.70 0.76 0.78  CALCIUM 8.4* 8.3* 8.5* 8.4*  MG  --   --  2.2 2.4   Liver  Function Tests: Recent Labs  Lab 01/14/20 0713 01/17/20 1921  AST 9* 10*  ALT 11 13  ALKPHOS  --  91  BILITOT 0.2 0.3  PROT 6.4 7.3  ALBUMIN  --  3.6   No results for input(s): LIPASE, AMYLASE in the last 168 hours. No results for input(s): AMMONIA in the last 168 hours. CBC: Recent Labs  Lab 01/14/20 0713 01/14/20 0717 01/17/20 0808 01/17/20 1921 01/18/20 0823  WBC 7.9 7.6 7.0 9.4 8.2  NEUTROABS  --   --  4.6 6.1  --   HGB 6.5* 6.6* 6.6* 6.5* 8.1*  HCT 24.5* 24.4* 25.5* 24.6* 29.1*  MCV 61.6* 61.5* 63.0* 63.2* 69.0*  PLT 499* 519* 548* 575* 524*   Cardiac Enzymes: No results for input(s): CKTOTAL, CKMB, CKMBINDEX, TROPONINI in the last 168 hours. BNP: BNP (last 3 results) No results for input(s): BNP in the last 8760 hours.  ProBNP (last 3 results) No results for input(s): PROBNP in the last 8760 hours.  CBG: No results for input(s): GLUCAP in the last 168 hours.     Signed:  Dia Crawford, MD [facility] [phone] pager

## 2020-01-19 ENCOUNTER — Telehealth: Payer: Self-pay | Admitting: *Deleted

## 2020-01-19 ENCOUNTER — Other Ambulatory Visit: Payer: Self-pay | Admitting: Obstetrics and Gynecology

## 2020-01-19 LAB — BPAM RBC
Blood Product Expiration Date: 202110052359
Blood Product Expiration Date: 202110062359
ISSUE DATE / TIME: 202109192135
ISSUE DATE / TIME: 202109200105
Unit Type and Rh: 6200
Unit Type and Rh: 6200

## 2020-01-19 LAB — TYPE AND SCREEN
ABO/RH(D): A POS
Antibody Screen: NEGATIVE
Unit division: 0
Unit division: 0

## 2020-01-19 MED ORDER — FERROUS SULFATE 325 (65 FE) MG PO TABS: 325.0000 mg | ORAL_TABLET | Freq: Two times a day (BID) | ORAL | 1 refills | Status: DC

## 2020-01-19 NOTE — Telephone Encounter (Signed)
Pt sent message through my chart that she does not want to do the Feraheme irone infusions because she cannot miss work 3 weeks in a row.  She states that if she starts having diziness again she will go back to the ED.  Her recent Hgb was 6.6.  She was in patient @ [facility] for 2 units of packed RBC's.  I have forwarded this message to Dr Rosana Hoes who originally ordered the iron infusions.

## 2020-01-20 ENCOUNTER — Other Ambulatory Visit: Payer: Self-pay | Admitting: Sports Medicine

## 2020-01-20 DIAGNOSIS — G894 Chronic pain syndrome: Secondary | ICD-10-CM

## 2020-01-27 ENCOUNTER — Encounter: Payer: Self-pay | Admitting: *Deleted

## 2020-01-27 ENCOUNTER — Telehealth: Payer: Self-pay | Admitting: *Deleted

## 2020-01-27 ENCOUNTER — Other Ambulatory Visit: Payer: Self-pay | Admitting: *Deleted

## 2020-01-27 MED ORDER — LEVONORGESTREL 20 MCG/24HR IU IUD: 1.0000 | INTRAUTERINE_SYSTEM | Freq: Once | INTRAUTERINE | 0 refills | Status: AC

## 2020-01-27 NOTE — Telephone Encounter (Signed)
Mirena verified through [employer] under pharmacy benefits.  RX sent to [facility] outpatient pharmacy to be delivered to [facility] outpatient OR for 02/16/20

## 2020-01-28 ENCOUNTER — Other Ambulatory Visit: Payer: Self-pay | Admitting: Obstetrics and Gynecology

## 2020-02-11 ENCOUNTER — Other Ambulatory Visit: Payer: Self-pay | Admitting: Obstetrics and Gynecology

## 2020-02-11 ENCOUNTER — Telehealth: Payer: Self-pay

## 2020-02-11 ENCOUNTER — Other Ambulatory Visit: Payer: Self-pay | Admitting: Sports Medicine

## 2020-02-11 DIAGNOSIS — G894 Chronic pain syndrome: Secondary | ICD-10-CM

## 2020-02-11 NOTE — Telephone Encounter (Signed)
Called patient, no answer, left voicemail, informed her surgery location has changed from [facility] to [facility]

## 2020-02-12 ENCOUNTER — Encounter (HOSPITAL_COMMUNITY): Payer: Self-pay | Admitting: Obstetrics and Gynecology

## 2020-02-12 ENCOUNTER — Other Ambulatory Visit: Payer: Self-pay | Admitting: Obstetrics and Gynecology

## 2020-02-12 ENCOUNTER — Other Ambulatory Visit (HOSPITAL_COMMUNITY)
Admission: RE | Admit: 2020-02-12 | Discharge: 2020-02-12 | Disposition: A | Discharge status: Home / Self Care | Payer: No Typology Code available for payment source | Source: Ambulatory Visit | Attending: Obstetrics and Gynecology | Admitting: Obstetrics and Gynecology

## 2020-02-12 ENCOUNTER — Other Ambulatory Visit: Payer: Self-pay

## 2020-02-12 DIAGNOSIS — Z20822 Contact with and (suspected) exposure to covid-19: Secondary | ICD-10-CM | POA: Insufficient documentation

## 2020-02-12 DIAGNOSIS — Z01812 Encounter for preprocedural laboratory examination: Secondary | ICD-10-CM | POA: Diagnosis present

## 2020-02-12 NOTE — Progress Notes (Signed)
PCP - Dr. Aundria Mems Cardiologist - Denies. Was cleared by Dr. Arlyce Dice cardiothoracic  Chest x-ray - 06/02/18 EKG - 01/18/20 Stress Test - Denies ECHO - Denies Cardiac Cath - Denies  Sleep Study -  2016 No OSA  DM - Denies  ERAS Protocol - Yes  COVID TEST- 02/12/20  Anesthesia review: No   Patient denies shortness of breath, fever, cough and chest pain at PAT appointment   All instructions explained to the patient, with a verbal understanding of the material. Patient also instructed to self quarantine after being tested for COVID-19. The opportunity to ask questions was provided.  Medications to be taken on day of surgery : cyclobenzaprine (FLEXERIL) 10 MG tablet as needed, ferrous sulfate (FERROUSUL) 325 (65 FE) MG tablet,  medroxyPROGESTERone (PROVERA) 10 MG tablet,ondansetron (ZOFRAN ODT) 4 MG disintegrating tablet as needed,  rizatriptan (MAXALT-MLT) 5 MG disintegrating tablet as needed

## 2020-02-13 LAB — SARS CORONAVIRUS 2 (TAT 6-24 HRS): SARS Coronavirus 2: NEGATIVE

## 2020-02-15 ENCOUNTER — Other Ambulatory Visit: Payer: Self-pay | Admitting: Sports Medicine

## 2020-02-15 DIAGNOSIS — G894 Chronic pain syndrome: Secondary | ICD-10-CM

## 2020-02-16 ENCOUNTER — Ambulatory Visit (HOSPITAL_COMMUNITY): Payer: No Typology Code available for payment source | Admitting: Anesthesiology

## 2020-02-16 ENCOUNTER — Other Ambulatory Visit: Payer: Self-pay

## 2020-02-16 ENCOUNTER — Encounter (HOSPITAL_COMMUNITY): Payer: Self-pay | Admitting: Obstetrics and Gynecology

## 2020-02-16 ENCOUNTER — Ambulatory Visit (HOSPITAL_COMMUNITY)
Admission: RE | Disposition: A | Discharge status: Home / Self Care | Payer: Self-pay | Source: Home / Self Care | Attending: Obstetrics and Gynecology

## 2020-02-16 ENCOUNTER — Ambulatory Visit (HOSPITAL_COMMUNITY)
Admission: RE | Admit: 2020-02-16 | Discharge: 2020-02-16 | Disposition: A | Discharge status: Home / Self Care | Payer: No Typology Code available for payment source | Attending: Obstetrics and Gynecology | Admitting: Obstetrics and Gynecology

## 2020-02-16 DIAGNOSIS — F418 Other specified anxiety disorders: Secondary | ICD-10-CM | POA: Diagnosis not present

## 2020-02-16 DIAGNOSIS — G43909 Migraine, unspecified, not intractable, without status migrainosus: Secondary | ICD-10-CM | POA: Diagnosis not present

## 2020-02-16 DIAGNOSIS — Z793 Long term (current) use of hormonal contraceptives: Secondary | ICD-10-CM | POA: Insufficient documentation

## 2020-02-16 DIAGNOSIS — N939 Abnormal uterine and vaginal bleeding, unspecified: Secondary | ICD-10-CM | POA: Diagnosis not present

## 2020-02-16 DIAGNOSIS — Z79899 Other long term (current) drug therapy: Secondary | ICD-10-CM | POA: Insufficient documentation

## 2020-02-16 DIAGNOSIS — N840 Polyp of corpus uteri: Secondary | ICD-10-CM | POA: Diagnosis not present

## 2020-02-16 DIAGNOSIS — Z30430 Encounter for insertion of intrauterine contraceptive device: Secondary | ICD-10-CM | POA: Diagnosis not present

## 2020-02-16 HISTORY — PX: INTRAUTERINE DEVICE (IUD) INSERTION: SHX5877

## 2020-02-16 HISTORY — PX: DILATATION & CURETTAGE/HYSTEROSCOPY WITH MYOSURE: SHX6511

## 2020-02-16 HISTORY — DX: Other complications of anesthesia, initial encounter: T88.59XA

## 2020-02-16 LAB — CBC
HCT: 32.8 % — ABNORMAL LOW (ref 36.0–46.0)
Hemoglobin: 9 g/dL — ABNORMAL LOW (ref 12.0–15.0)
MCH: 18.9 pg — ABNORMAL LOW (ref 26.0–34.0)
MCHC: 27.4 g/dL — ABNORMAL LOW (ref 30.0–36.0)
MCV: 68.9 fL — ABNORMAL LOW (ref 80.0–100.0)
Platelets: 509 10*3/uL — ABNORMAL HIGH (ref 150–400)
RBC: 4.76 MIL/uL (ref 3.87–5.11)
RDW: 22.7 % — ABNORMAL HIGH (ref 11.5–15.5)
WBC: 9.2 10*3/uL (ref 4.0–10.5)
nRBC: 0 % (ref 0.0–0.2)

## 2020-02-16 LAB — BASIC METABOLIC PANEL
Anion gap: 10 (ref 5–15)
BUN: 7 mg/dL (ref 6–20)
CO2: 24 mmol/L (ref 22–32)
Calcium: 8.7 mg/dL — ABNORMAL LOW (ref 8.9–10.3)
Chloride: 102 mmol/L (ref 98–111)
Creatinine, Ser: 0.67 mg/dL (ref 0.44–1.00)
GFR, Estimated: >60 mL/min (ref >60)
Glucose, Bld: 90 mg/dL (ref 70–99)
Potassium: 4.1 mmol/L (ref 3.5–5.1)
Sodium: 136 mmol/L (ref 135–145)

## 2020-02-16 LAB — POCT PREGNANCY, URINE: Preg Test, Ur: NEGATIVE

## 2020-02-16 SURGERY — DILATATION & CURETTAGE/HYSTEROSCOPY WITH MYOSURE
Anesthesia: General | Site: Vagina

## 2020-02-16 MED ORDER — PROPOFOL 10 MG/ML IV BOLUS
INTRAVENOUS | Status: AC
  Filled 2020-02-16: qty 20

## 2020-02-16 MED ORDER — KETOROLAC TROMETHAMINE 30 MG/ML IJ SOLN
INTRAMUSCULAR | Status: DC | PRN
  Administered 2020-02-16: 30 mg via INTRAVENOUS

## 2020-02-16 MED ORDER — PHENYLEPHRINE 40 MCG/ML (10ML) SYRINGE FOR IV PUSH (FOR BLOOD PRESSURE SUPPORT)
PREFILLED_SYRINGE | INTRAVENOUS | Status: AC
  Filled 2020-02-16: qty 10

## 2020-02-16 MED ORDER — IBUPROFEN 600 MG PO TABS: 600.0000 mg | ORAL_TABLET | Freq: Four times a day (QID) | ORAL | 1 refills | Status: DC | PRN

## 2020-02-16 MED ORDER — LEVONORGESTREL 20 MCG/24HR IU IUD
INTRAUTERINE_SYSTEM | INTRAUTERINE | Status: AC
  Administered 2020-02-16: 14:00:00 52 via INTRAUTERINE
  Filled 2020-02-16 (×2): qty 1

## 2020-02-16 MED ORDER — OXYCODONE HCL 5 MG PO TABS
5.0000 mg | ORAL_TABLET | ORAL | 0 refills | Status: DC | PRN
Start: 2020-02-16 — End: 2020-03-04

## 2020-02-16 MED ORDER — ORAL CARE MOUTH RINSE: 15.0000 mL | Freq: Once | OROMUCOSAL | Status: AC

## 2020-02-16 MED ORDER — ACETAMINOPHEN 500 MG PO TABS
1000.0000 mg | ORAL_TABLET | Freq: Once | ORAL | Status: AC
  Administered 2020-02-16: 1000 mg via ORAL
  Filled 2020-02-16: qty 2

## 2020-02-16 MED ORDER — DEXAMETHASONE SODIUM PHOSPHATE 10 MG/ML IJ SOLN
INTRAMUSCULAR | Status: DC | PRN
  Administered 2020-02-16: 4 mg via INTRAVENOUS

## 2020-02-16 MED ORDER — SODIUM CHLORIDE 0.9 % IR SOLN
Status: DC | PRN
  Administered 2020-02-16: 3000 mL

## 2020-02-16 MED ORDER — PROPOFOL 10 MG/ML IV BOLUS
INTRAVENOUS | Status: DC | PRN
  Administered 2020-02-16: 200 mg via INTRAVENOUS
  Administered 2020-02-16: 50 mg via INTRAVENOUS

## 2020-02-16 MED ORDER — ONDANSETRON HCL 4 MG/2ML IJ SOLN
INTRAMUSCULAR | Status: AC
  Filled 2020-02-16: qty 2

## 2020-02-16 MED ORDER — FENTANYL CITRATE (PF) 250 MCG/5ML IJ SOLN
INTRAMUSCULAR | Status: DC | PRN
  Administered 2020-02-16: 100 ug via INTRAVENOUS

## 2020-02-16 MED ORDER — POVIDONE-IODINE 10 % EX SWAB
2.0000 "application " | Freq: Once | CUTANEOUS | Status: AC
  Administered 2020-02-16: 2 via TOPICAL

## 2020-02-16 MED ORDER — KETOROLAC TROMETHAMINE 30 MG/ML IJ SOLN
INTRAMUSCULAR | Status: AC
  Filled 2020-02-16: qty 1

## 2020-02-16 MED ORDER — LIDOCAINE 2% (20 MG/ML) 5 ML SYRINGE
INTRAMUSCULAR | Status: DC | PRN
  Administered 2020-02-16: 60 mg via INTRAVENOUS

## 2020-02-16 MED ORDER — LACTATED RINGERS IV SOLN: INTRAVENOUS | Status: DC

## 2020-02-16 MED ORDER — LIDOCAINE 2% (20 MG/ML) 5 ML SYRINGE
INTRAMUSCULAR | Status: AC
  Filled 2020-02-16: qty 5

## 2020-02-16 MED ORDER — FENTANYL CITRATE (PF) 250 MCG/5ML IJ SOLN
INTRAMUSCULAR | Status: AC
  Filled 2020-02-16: qty 5

## 2020-02-16 MED ORDER — MIDAZOLAM HCL 2 MG/2ML IJ SOLN
INTRAMUSCULAR | Status: DC | PRN
  Administered 2020-02-16: 2 mg via INTRAVENOUS

## 2020-02-16 MED ORDER — ONDANSETRON HCL 4 MG/2ML IJ SOLN
INTRAMUSCULAR | Status: DC | PRN
  Administered 2020-02-16: 4 mg via INTRAVENOUS

## 2020-02-16 MED ORDER — MIDAZOLAM HCL 2 MG/2ML IJ SOLN
INTRAMUSCULAR | Status: AC
  Filled 2020-02-16: qty 2

## 2020-02-16 MED ORDER — DEXAMETHASONE SODIUM PHOSPHATE 10 MG/ML IJ SOLN
INTRAMUSCULAR | Status: AC
  Filled 2020-02-16: qty 1

## 2020-02-16 MED ORDER — PHENYLEPHRINE 40 MCG/ML (10ML) SYRINGE FOR IV PUSH (FOR BLOOD PRESSURE SUPPORT)
PREFILLED_SYRINGE | INTRAVENOUS | Status: DC | PRN
  Administered 2020-02-16 (×2): 80 ug via INTRAVENOUS

## 2020-02-16 MED ORDER — SOD CITRATE-CITRIC ACID 500-334 MG/5ML PO SOLN
30.0000 mL | ORAL | Status: AC
  Administered 2020-02-16: 30 mL via ORAL
  Filled 2020-02-16: qty 15

## 2020-02-16 MED ORDER — CHLORHEXIDINE GLUCONATE 0.12 % MT SOLN
15.0000 mL | Freq: Once | OROMUCOSAL | Status: AC
  Administered 2020-02-16: 15 mL via OROMUCOSAL
  Filled 2020-02-16: qty 15

## 2020-02-16 SURGICAL SUPPLY — 17 items
CANISTER SUCT 3000ML PPV (MISCELLANEOUS) ×3 IMPLANT
CATH ROBINSON RED A/P 16FR (CATHETERS) ×3 IMPLANT
GLOVE BIOGEL PI IND STRL 6.5 (GLOVE) ×1 IMPLANT
GLOVE BIOGEL PI IND STRL 7.0 (GLOVE) ×1 IMPLANT
GLOVE BIOGEL PI INDICATOR 6.5 (GLOVE) ×2
GLOVE BIOGEL PI INDICATOR 7.0 (GLOVE) ×2
GLOVE ORTHOPEDIC STR SZ6.5 (GLOVE) ×3 IMPLANT
GOWN STRL REUS W/ TWL LRG LVL3 (GOWN DISPOSABLE) ×2 IMPLANT
GOWN STRL REUS W/TWL LRG LVL3 (GOWN DISPOSABLE) ×6
KIT PROCEDURE FLUENT (KITS) ×3 IMPLANT
KIT TURNOVER KIT B (KITS) ×3 IMPLANT
PACK VAGINAL MINOR WOMEN LF (CUSTOM PROCEDURE TRAY) ×3 IMPLANT
PAD OB MATERNITY 4.3X12.25 (PERSONAL CARE ITEMS) ×3 IMPLANT
SEAL ROD LENS SCOPE MYOSURE (ABLATOR) ×3 IMPLANT
TOWEL GREEN STERILE FF (TOWEL DISPOSABLE) ×6 IMPLANT
UNDERPAD 30X36 HEAVY ABSORB (UNDERPADS AND DIAPERS) ×3 IMPLANT
mirena IUD ×3 IMPLANT

## 2020-02-16 NOTE — Anesthesia Preprocedure Evaluation (Addendum)
Anesthesia Evaluation  Patient identified by MRN, date of birth, ID band Patient awake    Reviewed: Allergy & Precautions, NPO status , Patient's Chart, lab work & pertinent test results  Airway Mallampati: III  TM Distance: >3 FB Neck ROM: Full    Dental  (+) Teeth Intact, Chipped, Dental Advisory Given,    Pulmonary    Pulmonary exam normal breath sounds clear to auscultation       Cardiovascular Normal cardiovascular exam Rhythm:Regular Rate:Normal  2009: intracardiac soft tissue mass removed, open heart surgery. Some issues with wound closure afterwards but no problems recently   Neuro/Psych  Headaches, Seizures -, Well Controlled,  PSYCHIATRIC DISORDERS Anxiety Depression Pt states seizure in past from overdose on tramadol, not on antiepileptics     GI/Hepatic Neg liver ROS, GERD  Medicated and Controlled,  Endo/Other  Morbid obesityBMI 53  Renal/GU negative Renal ROS  negative genitourinary   Musculoskeletal negative musculoskeletal ROS (+)   Abdominal (+) + obese,   Peds  Hematology negative hematology ROS (+)   Anesthesia Other Findings   Reproductive/Obstetrics Abnormal uterine bleeding                             Anesthesia Physical Anesthesia Plan  ASA: III  Anesthesia Plan: General   Post-op Pain Management:    Induction: Intravenous  PONV Risk Score and Plan: 4 or greater and Ondansetron, Dexamethasone, Midazolam and Treatment may vary due to age or medical condition  Airway Management Planned: LMA  Additional Equipment: None  Intra-op Plan:   Post-operative Plan: Extubation in OR  Informed Consent: I have reviewed the patients History and Physical, chart, labs and discussed the procedure including the risks, benefits and alternatives for the proposed anesthesia with the patient or authorized representative who has indicated his/her understanding and acceptance.      Dental advisory given  Plan Discussed with: CRNA  Anesthesia Plan Comments:        Anesthesia Quick Evaluation

## 2020-02-16 NOTE — Op Note (Addendum)
Regina [name] PROCEDURE DATE: 02/16/2020   PREOPERATIVE DIAGNOSIS:  abnormal uterine bleeding  POSTOPERATIVE DIAGNOSIS: same  PROCEDURE: Hysteroscopy dilation and curettage, Mirena IUD placement  SURGEON:  Dr. Feliz Beam  ASSISTANT:  none  ANESTHESIOLOGY TEAM: Anesthesiologist: Pervis Hocking, DO CRNA: Renato Shin, CRNA  INDICATIONS: 44 y.o. [phone]  here for scheduled surgery for the aforementioned diagnoses.   Risks of surgery were discussed with the patient including but not limited to: bleeding which may require transfusion; infection which may require antibiotics; injury to uterus or surrounding organs; intrauterine scarring which may impair future fertility; need for additional procedures including laparotomy or laparoscopy; and other postoperative/anesthesia complications. Written informed consent was obtained.    FINDINGS: Normal appearing female genitalia with multiparous normal appearing cervix.  8 weeks size uterus. Thickened diffuse proliferative endometrium.  Normal ostia bilaterally.  ANESTHESIA:   General INTRAVENOUS FLUIDS:  350 ml of LR FLUID DEFICITS:  50 ml of normal saline ESTIMATED BLOOD LOSS:  Less than 20 ml URINE OUTPUT: 20 mL clear yellow urine SPECIMENS: endometrial curettings sent to pathology COMPLICATIONS:  None immediate.  PROCEDURE DETAILS:  The patient was seen in the pre-op area where the plan was reviewed and she again verbalized understanding and consent.  She was then taken to the operating room where general anesthesia was administered.  After an adequate timeout was performed, she was placed in the dorsal lithotomy position and examined; then prepped and draped in the sterile manner. A straight catheter was inserted into the bladder sterilely and the bladder was drained.  A weighted speculum was then placed in the patient's vagina and a Sims retractor used to visualize the cervix. A single toothed tenaculum was applied to the anterior  lip of the cervix. The uterus was sounded to 10 cm. The cervix then dilated manually with metal dilators to accommodate the 6 mm operative hysteroscope.  Once the cervix was dilated, the hysteroscope was inserted under direct visualization using normal saline as a suspension medium.  The uterine cavity was carefully examined with the findings as noted above. The hysteroscope was removed and a sharp curette was introduced into the cavity. The uterine cavity was curetted in all quadrants. The hysteroscope was reintroduced and fluffy tissue noted anteriorly. This hysteroscope was removed and the uterus was again curetted in all quadrants, taking care to obtain tissue anteriorly. Once this was completed, the Mirena IUD was placed per manufacturer's instructions. The endometrial curettings were sent to pathology. The tenaculum was removed from the anterior lip of the cervix with good hemostasis noted at the tenaculum site. All instruments were remvoed from the vagina.  The patient tolerated the procedure well and was taken to the recovery area awake, extubated and in stable condition.  The patient will be discharged to home as per PACU criteria.  Routine postoperative instructions given.  She was prescribed oxycodone & Ibuprofen.  She will follow up in the clinic in 2 weeks  for postoperative evaluation.  Mirena IUD Lot: [id number] Exp: 12/2021   K. Arvilla Meres, M.D. Attending [place], [facility] for [employer], [place]

## 2020-02-16 NOTE — OR Nursing (Signed)
Expiration date on Mirena IUD was documented wrong. Should be 01/27/2022 NOT 02/16/2020.

## 2020-02-16 NOTE — Transfer of Care (Signed)
Immediate Anesthesia Transfer of Care Note  Patient: Regina [name]  Procedure(s) Performed: DILATATION & CURETTAGE/HYSTEROSCOPY WITH MYOSURE (N/A Vagina ) INTRAUTERINE DEVICE (IUD) INSERTION (N/A Vagina )  Patient Location: PACU  Anesthesia Type:General  Level of Consciousness: awake and patient cooperative  Airway & Oxygen Therapy: Patient Spontanous Breathing and Patient connected to nasal cannula oxygen  Post-op Assessment: Report given to RN and Post -op Vital signs reviewed and stable  Post vital signs: Reviewed and stable  Last Vitals:  Vitals Value Taken Time  BP 146/82 02/16/20 1341  Temp    Pulse 96 02/16/20 1345  Resp 18 02/16/20 1345  SpO2 99 % 02/16/20 1345  Vitals shown include unvalidated device data.  Last Pain:  Vitals:   02/16/20 0959  TempSrc:   PainSc: 2       Patients Stated Pain Goal: 0 (32/34/68 8737)  Complications: No complications documented.

## 2020-02-16 NOTE — Anesthesia Postprocedure Evaluation (Signed)
Anesthesia Post Note  Patient: Regina [name]  Procedure(s) Performed: DILATATION & CURETTAGE/HYSTEROSCOPY WITH MYOSURE (N/A Vagina ) INTRAUTERINE DEVICE (IUD) INSERTION (N/A Vagina )     Patient location during evaluation: PACU Anesthesia Type: General Level of consciousness: awake and alert, oriented and patient cooperative Pain management: pain level controlled Vital Signs Assessment: post-procedure vital signs reviewed and stable Respiratory status: spontaneous breathing, nonlabored ventilation and respiratory function stable Cardiovascular status: blood pressure returned to baseline and stable Postop Assessment: no apparent nausea or vomiting Anesthetic complications: no   No complications documented.  Last Vitals:  Vitals:   02/16/20 1410 02/16/20 1426  BP: (!) 152/90   Pulse: 90   Resp: 12   Temp:  (!) 36.4 C  SpO2: 99%     Last Pain:  Vitals:   02/16/20 1426  TempSrc:   PainSc: 0-No pain                 Pervis Hocking

## 2020-02-16 NOTE — Discharge Instructions (Signed)
Hysteroscopy, Care After This sheet gives you information about how to care for yourself after your procedure. Your health care provider may also give you more specific instructions. If you have problems or questions, contact your health care provider. What can I expect after the procedure? After the procedure, it is common to have:  Cramping.  Bleeding. This can vary from light spotting to menstrual-like bleeding. Follow these instructions at home: Activity  Rest for 1-2 days after the procedure.  Do not douche, use tampons, or have sex for 2 weeks after the procedure, or until your health care provider approves.  Do not drive for 24 hours after the procedure, or for as long as told by your health care provider.  Do not drive, use heavy machinery, or drink alcohol while taking prescription pain medicines. Medicines   Take over-the-counter and prescription medicines only as told by your health care provider.  Do not take aspirin during recovery. It can increase the risk of bleeding. General instructions  Do not take baths, swim, or use a hot tub until your health care provider approves. Take showers instead of baths for 2 weeks, or for as long as told by your health care provider.  To prevent or treat constipation while you are taking prescription pain medicine, your health care provider may recommend that you: ? Drink enough fluid to keep your urine clear or pale yellow. ? Take over-the-counter or prescription medicines. ? Eat foods that are high in fiber, such as fresh fruits and vegetables, whole grains, and beans. ? Limit foods that are high in fat and processed sugars, such as fried and sweet foods.  Keep all follow-up visits as told by your health care provider. This is important. Contact a health care provider if:  You feel dizzy or lightheaded.  You feel nauseous.  You have abnormal vaginal discharge.  You have a rash.  You have pain that does not get better with  medicine.  You have chills. Get help right away if:  You have bleeding that is heavier than a normal menstrual period.  You have a fever.  You have pain or cramps that get worse.  You develop new abdominal pain.  You faint.  You have pain in your shoulders.  You have shortness of breath. Summary  After the procedure, you may have cramping and some vaginal bleeding.  Do not douche, use tampons, or have sex for 2 weeks after the procedure, or until your health care provider approves.  Do not take baths, swim, or use a hot tub until your health care provider approves. Take showers instead of baths for 2 weeks, or for as long as told by your health care provider.  Report any unusual symptoms to your health care provider.  Keep all follow-up visits as told by your health care provider. This is important. This information is not intended to replace advice given to you by your health care provider. Make sure you discuss any questions you have with your health care provider. Document Revised: 03/29/2017 Document Reviewed: 05/15/2016 Elsevier Patient Education  [place].

## 2020-02-16 NOTE — H&P (Signed)
OB/GYN Pre-Op History and Physical  Regina [name] is a 44 y.o. [phone] presenting for [facility] hysteroscopy, IUD placement for dysfunctional uterine bleeding. She is having slightly less bleeding than before.       Past Medical History:  Diagnosis Date  . Anemia 2021  . Complication of anesthesia    Maybe trouble waking up once  . Depression   . Depression with anxiety   . Migraine   . Morbid obesity ([place])   . Panic disorder   . Seizures ([place])     Past Surgical History:  Procedure Laterality Date  . 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 DILATION AND CURETTAGE, DIAGNOSTIC / THERAPEUTIC    . SOFT TISSUE TUMOR RESECTION  06/17/07   chest/ wound reclosure 4/09'    OB History  Gravida Para Term Preterm AB Living  5 3 3    0 3  SAB TAB Ectopic Multiple Live Births               # Outcome Date GA Lbr Len/2nd Weight Sex Delivery Anes PTL Lv  5 Gravida           4 Gravida           3 Term           2 Term           1 Term             Social History   Socioeconomic History  . Marital status: Married    Spouse name: Not on file  . Number of children: Not on file  . Years of education: Not on file  . Highest education level: Not on file  Occupational History  . Not on file  Tobacco Use  . Smoking status: Never Smoker  . Smokeless tobacco: Never Used  Vaping Use  . Vaping Use: Never used  Substance and Sexual Activity  . Alcohol use: No  . Drug use: No  . Sexual activity: Yes    Birth control/protection: Condom  Other Topics Concern  . Not on file  Social History Narrative  . Not on file   Social Determinants of Health   Financial Resource Strain:   . Difficulty of Paying Living Expenses: Not on file  Food Insecurity:   . Worried About Charity fundraiser in the Last Year: Not on file  . Ran Out of Food in the Last Year: Not on file  Transportation Needs:   . Lack of Transportation (Medical): Not on file  . Lack of Transportation (Non-Medical): Not on file   Physical Activity:   . Days of Exercise per Week: Not on file  . Minutes of Exercise per Session: Not on file  Stress:   . Feeling of Stress : Not on file  Social Connections:   . Frequency of Communication with Friends and Family: Not on file  . Frequency of Social Gatherings with Friends and Family: Not on file  . Attends Religious Services: Not on file  . Active Member of Clubs or Organizations: Not on file  . Attends Archivist Meetings: Not on file  . Marital Status: Not on file    Family History  Problem Relation Age of Onset  . Hypertension Mother   . Migraines Mother   . Depression Father   . Suicidality Father     Medications Prior to Admission  Medication Sig Dispense Refill Last Dose  . cyclobenzaprine (FLEXERIL) 10 MG tablet Take  1 tablet (10 mg total) by mouth 3 (three) times daily as needed for muscle spasms. 90 tablet 3 Past Week at Unknown time  . ferrous sulfate 325 (65 FE) MG tablet TAKE 1 TABLET BY MOUTH TWICE A DAY 60 tablet 1 02/16/2020 at 0815  . hydrOXYzine (ATARAX/VISTARIL) 50 MG tablet TAKE 1 TABLET (50 MG TOTAL) BY MOUTH AT BEDTIME. NO FURTHER REFILLS WITHOUT APPT- CAN BE VIRTUAL (Patient taking differently: Take 50 mg by mouth at bedtime. ) 90 tablet 2 02/15/2020 at Unknown time  . medroxyPROGESTERone (PROVERA) 10 MG tablet TAKE 2 TABLETS BY MOUTH EVERY DAY 180 tablet 1 02/16/2020 at 0815  . pantoprazole (PROTONIX) 40 MG tablet Take 1 tablet (40 mg total) by mouth daily. 90 tablet 1 02/15/2020 at Unknown time  . traZODone (DESYREL) 50 MG tablet TAKE 1 TABLET BY MOUTH EVERYDAY AT BEDTIME (Patient taking differently: Take 50 mg by mouth at bedtime. ) 90 tablet 1 02/15/2020 at Unknown time  . venlafaxine XR (EFFEXOR-XR) 75 MG 24 hr capsule TAKE 3 CAPSULES (225 MG TOTAL) BY MOUTH DAILY WITH BREAKFAST. 270 capsule 2 02/15/2020 at Unknown time  . albuterol (VENTOLIN HFA) 108 (90 Base) MCG/ACT inhaler Inhale 2 puffs into the lungs every 6 (six) hours as  needed for wheezing or shortness of breath. (Patient not taking: Reported on 01/17/2020) 6.7 g 0   . lamoTRIgine (LAMICTAL) 25 MG tablet Take 2 tablets (50 mg total) by mouth 2 (two) times daily. (Patient not taking: Reported on 01/17/2020) 120 tablet 3   . levonorgestrel (MIRENA) 20 MCG/24HR IUD 1 Intra Uterine Device (1 each total) by Intrauterine route once for 1 dose. Pt to have placed in OR @ [facility] by Dr Rosana Hoes 02/16/20.  Please have [facility] sent to [facility].  Covered under Pharmacy benefits 1 each 0   . ondansetron (ZOFRAN ODT) 4 MG disintegrating tablet 4mg  ODT q4 hours prn nausea/vomit (Patient taking differently: Take 4 mg by mouth every 4 (four) hours as needed (nausea related to migraines). 4mg  ODT q4 hours prn nausea/vomit) 8 tablet 0 More than a month at Unknown time  . rizatriptan (MAXALT-MLT) 5 MG disintegrating tablet Take 1 tablet (5 mg total) by mouth as needed for migraine. May repeat in 2 hours if needed (Patient taking differently: Take 5 mg by mouth See admin instructions. Take one tablet (5 mg) at onset of migraine, may repeat in 2 hours if needed) 10 tablet 2 More than a month at Unknown time  . TROKENDI XR 50 MG CP24 TAKE 1 CAPSULE BY MOUTH EVERY DAY (Patient not taking: Reported on 02/11/2020) 90 capsule 0 Not Taking at Unknown time    Allergies  Allergen Reactions  . Codeine Itching  . Sumatriptan Other (See Comments)    Palpitations    Review of Systems: Negative except for what is mentioned in HPI.     Physical Exam: BP (!) 155/50   Pulse 87   Temp 97.9 F (36.6 C) (Temporal)   Resp 18   Ht 5\' 3"  (1.6 m)   Wt 134.7 kg   LMP 01/17/2020   SpO2 97%   BMI 52.61 kg/m  CONSTITUTIONAL: Well-developed, well-nourished female in no acute distress.  HENT:  Normocephalic, atraumatic, External right and left ear normal. Oropharynx is clear and moist EYES: Conjunctivae and EOM are normal. Pupils are equal, round, and reactive to light.  No scleral icterus.  NECK: Normal range of motion, supple, no masses SKIN: Skin is warm and dry.  No rash noted. Not diaphoretic. No erythema. No pallor. [place]: Alert and oriented to person, place, and time. Normal reflexes, muscle tone coordination. No cranial nerve deficit noted. PSYCHIATRIC: Normal mood and affect. Normal behavior. Normal judgment and thought content. CARDIOVASCULAR: Normal heart rate noted RESPIRATORY: Effort normal, no problems with respiration noted ABDOMEN: Soft, nontender, nondistended PELVIC: Deferred MUSCULOSKELETAL: Normal range of motion. No edema and no tenderness. 2+ distal pulses.   Pertinent Labs/Studies:   Results for orders placed or performed during the hospital encounter of 02/16/20 (from the past 72 hour(s))  Pregnancy, urine POC     Status: None   Collection Time: 02/16/20  9:29 AM  Result Value Ref Range   Preg Test, Ur NEGATIVE NEGATIVE    Comment:        THE SENSITIVITY OF THIS METHODOLOGY IS >24 mIU/mL   CBC     Status: Abnormal   Collection Time: 02/16/20 10:50 AM  Result Value Ref Range   WBC 9.2 4.0 - 10.5 K/uL   RBC 4.76 3.87 - 5.11 MIL/uL   Hemoglobin 9.0 (L) 12.0 - 15.0 g/dL   HCT 32.8 (L) 36 - 46 %   MCV 68.9 (L) 80.0 - 100.0 fL   MCH 18.9 (L) 26.0 - 34.0 pg   MCHC 27.4 (L) 30.0 - 36.0 g/dL   RDW 22.7 (H) 11.5 - 15.5 %   Platelets 509 (H) 150 - 400 K/uL    Comment: REPEATED TO VERIFY   nRBC 0.0 0.0 - 0.2 %    Comment: Performed at [facility], [address], [place], [place] [address]  Basic metabolic panel     Status: Abnormal   Collection Time: 02/16/20 10:50 AM  Result Value Ref Range   Sodium 136 135 - 145 mmol/L   Potassium 4.1 3.5 - 5.1 mmol/L   Chloride 102 98 - 111 mmol/L   CO2 24 22 - 32 mmol/L   Glucose, Bld 90 70 - 99 mg/dL    Comment: Glucose reference range applies only to samples taken after fasting for at least 8 hours.   BUN 7 6 - 20 mg/dL   Creatinine, Ser 0.67 0.44 - 1.00 mg/dL   Calcium 8.7  (L) 8.9 - 10.3 mg/dL   GFR, Estimated >60 >60 mL/min   Anion gap 10 5 - 15    Comment: Performed at [facility] [address], [place],  [address]       Assessment and Plan :Regina [name] is a 44 y.o. [phone] here for [facility] hysteroscopy, IUD placement for dysfunctional uterine bleeding. She has ongoing heavy, irregular menses, most recently requiring a blood transfusion for symptomatic anemia. No improvement with provera. Significantly thickened endometrial stripe (34 mm). Only recently has bleeding become irregular and problematic for her, recommended for D&C hysteroscopy and IUD placement. - The risks of hysteroscopic D&C were reviewed with the patient; including but not limited to: infection which may require antibiotics; bleeding which may require transfusion or re-operation; injury to bowel, bladder, ureters or other surrounding organs; need for additional procedures including hysterectomy in the event of a life-threatening hemorrhage; thromboembolic phenomenon and other postoperative/anesthesia complications. Reviewed that all sampling will be sent to pathology and further management based on findings. The patient concurred with the proposed plan, giving informed consent for the procedure.   Plan for [facility] hysteroscopy, IUD placement Anesthesia aware SCDs  Admission labs   K. Arvilla Meres, M.D. Attending [place], [facility] for [employer],  [facility]  

## 2020-02-16 NOTE — Progress Notes (Signed)
Nurse not notified to complete handoff before patient went to OR.

## 2020-02-16 NOTE — Anesthesia Procedure Notes (Signed)
Procedure Name: LMA Insertion Date/Time: 02/16/2020 1:01 PM Performed by: Renato Shin, CRNA Pre-anesthesia Checklist: Patient identified, Emergency Drugs available, Suction available and Patient being monitored Patient Re-evaluated:Patient Re-evaluated prior to induction Oxygen Delivery Method: Circle system utilized Preoxygenation: Pre-oxygenation with 100% oxygen Induction Type: IV induction LMA: LMA inserted LMA Size: 4.0 Number of attempts: 1 Placement Confirmation: positive ETCO2 and breath sounds checked- equal and bilateral Tube secured with: Tape Dental Injury: Teeth and Oropharynx as per pre-operative assessment

## 2020-02-17 ENCOUNTER — Encounter (HOSPITAL_COMMUNITY): Payer: Self-pay | Admitting: Obstetrics and Gynecology

## 2020-02-17 LAB — SURGICAL PATHOLOGY

## 2020-02-18 ENCOUNTER — Other Ambulatory Visit: Payer: Self-pay | Admitting: Sports Medicine

## 2020-02-18 DIAGNOSIS — G894 Chronic pain syndrome: Secondary | ICD-10-CM

## 2020-03-04 ENCOUNTER — Ambulatory Visit (INDEPENDENT_AMBULATORY_CARE_PROVIDER_SITE_OTHER): Payer: No Typology Code available for payment source | Admitting: Sports Medicine

## 2020-03-04 ENCOUNTER — Encounter: Payer: Self-pay | Admitting: Sports Medicine

## 2020-03-04 VITALS — BP 132/74 | HR 93 | Ht 63.0 in | Wt 293.0 lb

## 2020-03-04 DIAGNOSIS — Z0000 Encounter for general adult medical examination without abnormal findings: Secondary | ICD-10-CM | POA: Diagnosis not present

## 2020-03-04 DIAGNOSIS — E785 Hyperlipidemia, unspecified: Secondary | ICD-10-CM | POA: Diagnosis not present

## 2020-03-04 DIAGNOSIS — Z6841 Body Mass Index (BMI) 40.0 and over, adult: Secondary | ICD-10-CM

## 2020-03-04 DIAGNOSIS — F332 Major depressive disorder, recurrent severe without psychotic features: Secondary | ICD-10-CM

## 2020-03-04 DIAGNOSIS — D509 Iron deficiency anemia, unspecified: Secondary | ICD-10-CM

## 2020-03-04 DIAGNOSIS — Z23 Encounter for immunization: Secondary | ICD-10-CM

## 2020-03-04 NOTE — Assessment & Plan Note (Signed)
We have continued to have the discussion regarding bariatric surgery.  She is precontemplative. Her brother-in-law did have the surgery and lost over 250 pounds.

## 2020-03-04 NOTE — Assessment & Plan Note (Signed)
Annual physical as above, up-to-date on screening measures. Flu shot today. Routine labs ordered, return in a year for this.

## 2020-03-04 NOTE — Assessment & Plan Note (Signed)
Has overall been doing well with Effexor, Lamictal, Trokendi, hydroxyzine at bedtime, trazodone at bedtime. 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 She does have an appointment with [facility] sometime in January.

## 2020-03-04 NOTE — Assessment & Plan Note (Signed)
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 Blood loss is down to a trickle, hemoglobin trending upward, rechecking today. She is on oral iron supplementation.

## 2020-03-04 NOTE — Progress Notes (Signed)
Subjective:    CC: Annual Physical Exam  HPI:  This patient is here for their annual physical  I reviewed the past medical history, family history, social history, surgical history, and allergies today and no changes were needed.  Please see the problem list section below in epic for further details.  Past Medical History: Past Medical History:  Diagnosis Date  . Anemia 2021  . Complication of anesthesia    Maybe trouble waking up once  . Depression   . Depression with anxiety   . Migraine   . Morbid obesity ([place])   . Panic disorder   . Seizures ([place])    Past Surgical History: Past Surgical History:  Procedure Laterality Date  . 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 DILATATION & CURETTAGE/HYSTEROSCOPY WITH MYOSURE N/A 02/16/2020   Procedure: DILATATION & CURETTAGE/HYSTEROSCOPY WITH MYOSURE;  Surgeon: Sloan Leiter, MD;  Location: [place];  Service: Gynecology;  Laterality: N/A;  . DILATION AND CURETTAGE, DIAGNOSTIC / THERAPEUTIC    . INTRAUTERINE DEVICE (IUD) INSERTION N/A 02/16/2020   Procedure: INTRAUTERINE DEVICE (IUD) INSERTION;  Surgeon: Sloan Leiter, MD;  Location: [place];  Service: Gynecology;  Laterality: N/A;  IUD coming from inpatient pharmacy  . SOFT TISSUE TUMOR RESECTION  06/17/07   chest/ wound reclosure 4/09'   Social History: Social History   Socioeconomic History  . Marital status: Married    Spouse name: Not on file  . Number of children: Not on file  . Years of education: Not on file  . Highest education level: Not on file  Occupational History  . Not on file  Tobacco Use  . Smoking status: Never Smoker  . Smokeless tobacco: Never Used  Vaping Use  . Vaping Use: Never used  Substance and Sexual Activity  . Alcohol use: No  . Drug use: No  . Sexual activity: Yes    Birth control/protection: Condom  Other Topics Concern  . Not on file  Social History Narrative  . Not on file   Social Determinants of Health   Financial Resource Strain:    . Difficulty of Paying Living Expenses: Not on file  Food Insecurity:   . Worried About Charity fundraiser in the Last Year: Not on file  . Ran Out of Food in the Last Year: Not on file  Transportation Needs:   . Lack of Transportation (Medical): Not on file  . Lack of Transportation (Non-Medical): Not on file  Physical Activity:   . Days of Exercise per Week: Not on file  . Minutes of Exercise per Session: Not on file  Stress:   . Feeling of Stress : Not on file  Social Connections:   . Frequency of Communication with Friends and Family: Not on file  . Frequency of Social Gatherings with Friends and Family: Not on file  . Attends Religious Services: Not on file  . Active Member of Clubs or Organizations: Not on file  . Attends Archivist Meetings: Not on file  . Marital Status: Not on file   Family History: Family History  Problem Relation Age of Onset  . Hypertension Mother   . Migraines Mother   . Depression Father   . Suicidality Father    Allergies: Allergies  Allergen Reactions  . Codeine Itching  . Sumatriptan Other (See Comments)    Palpitations   Medications: See med rec.  Review of Systems: No headache, visual changes, nausea, vomiting, diarrhea, constipation, dizziness, abdominal pain,  skin rash, fevers, chills, night sweats, swollen lymph nodes, weight loss, chest pain, body aches, joint swelling, muscle aches, shortness of breath, mood changes, visual or auditory hallucinations.  Objective:    General: Well Developed, well nourished, and in no acute distress.  Neuro: Alert and oriented x3, extra-ocular muscles intact, sensation grossly intact. Cranial nerves II through XII are intact, motor, sensory, and coordinative functions are all intact. HEENT: Normocephalic, atraumatic, pupils equal round reactive to light, neck supple, no masses, no lymphadenopathy, thyroid nonpalpable. Oropharynx, nasopharynx, external ear canals are unremarkable. Skin:  Warm and dry, no rashes noted.  Cardiac: Regular rate and rhythm, no murmurs rubs or gallops.  Respiratory: Clear to auscultation bilaterally. Not using accessory muscles, speaking in full sentences.  Abdominal: Soft, nontender, nondistended, positive bowel sounds, no masses, no organomegaly.  Musculoskeletal: Shoulder, elbow, wrist, hip, knee, ankle stable, and with full range of motion.  Impression and Recommendations:    The patient was counselled, risk factors were discussed, anticipatory guidance given.  Annual physical exam Annual physical as above, up-to-date on screening measures. Flu shot today. Routine labs ordered, return in a year for this.  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 Blood loss is down to a trickle, hemoglobin trending upward, rechecking today. She is on oral iron supplementation.  Obesity We have continued to have the discussion regarding bariatric surgery.  She is precontemplative. Her brother-in-law did have the surgery and lost over 250 pounds.  Severe episode of recurrent major depressive disorder, without psychotic features ([place]) Has overall been doing well with Effexor, Lamictal, Trokendi, hydroxyzine at bedtime, trazodone at bedtime. 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 She does have an appointment with [facility] sometime in January.   ___________________________________________ Gwen Her. Dianah Field, M.D., ABFM., CAQSM. Primary Care and Sports Medicine [facility]  Adjunct Professor of [employer]

## 2020-03-07 ENCOUNTER — Other Ambulatory Visit: Payer: Self-pay | Admitting: Sports Medicine

## 2020-03-07 DIAGNOSIS — G894 Chronic pain syndrome: Secondary | ICD-10-CM

## 2020-03-07 LAB — LIPID PANEL
Cholesterol: 217 mg/dL — ABNORMAL HIGH (ref <200)
HDL: 43 mg/dL — ABNORMAL LOW (ref >=50)
LDL Cholesterol (Calc): 148 mg/dL (calc) — ABNORMAL HIGH
Non-HDL Cholesterol (Calc): 174 mg/dL (calc) — ABNORMAL HIGH (ref <130)
Total CHOL/HDL Ratio: 5 (calc) — ABNORMAL HIGH (ref <5.0)
Triglycerides: 140 mg/dL (ref <150)

## 2020-03-07 LAB — CBC
HCT: 31 % — ABNORMAL LOW (ref 35.0–45.0)
Hemoglobin: 9.1 g/dL — ABNORMAL LOW (ref 11.7–15.5)
MCH: 19 pg — ABNORMAL LOW (ref 27.0–33.0)
MCHC: 29.4 g/dL — ABNORMAL LOW (ref 32.0–36.0)
MCV: 64.9 fL — ABNORMAL LOW (ref 80.0–100.0)
MPV: 8.4 fL (ref 7.5–12.5)
Platelets: 497 10*3/uL — ABNORMAL HIGH (ref 140–400)
RBC: 4.78 10*6/uL (ref 3.80–5.10)
RDW: 19.7 % — ABNORMAL HIGH (ref 11.0–15.0)
WBC: 9.5 10*3/uL (ref 3.8–10.8)

## 2020-03-07 LAB — COMPREHENSIVE METABOLIC PANEL
AG Ratio: 1.4 (calc) (ref 1.0–2.5)
ALT: 14 U/L (ref 6–29)
AST: 13 U/L (ref 10–30)
Albumin: 4.2 g/dL (ref 3.6–5.1)
Alkaline phosphatase (APISO): 109 U/L (ref 31–125)
BUN: 9 mg/dL (ref 7–25)
CO2: 28 mmol/L (ref 20–32)
Calcium: 9.2 mg/dL (ref 8.6–10.2)
Chloride: 102 mmol/L (ref 98–110)
Creat: 0.58 mg/dL (ref 0.50–1.10)
Globulin: 3.1 g/dL (calc) (ref 1.9–3.7)
Glucose, Bld: 88 mg/dL (ref 65–99)
Potassium: 4.6 mmol/L (ref 3.5–5.3)
Sodium: 138 mmol/L (ref 135–146)
Total Bilirubin: 0.4 mg/dL (ref 0.2–1.2)
Total Protein: 7.3 g/dL (ref 6.1–8.1)

## 2020-03-07 LAB — TEST AUTHORIZATION

## 2020-03-07 LAB — HEPATITIS C ANTIBODY
Hepatitis C Ab: NONREACTIVE
SIGNAL TO CUT-OFF: 0.01 (ref <1.00)

## 2020-03-07 LAB — HEMOGLOBIN A1C
Hgb A1c MFr Bld: 4.9 % of total Hgb (ref <5.7)
Mean Plasma Glucose: 94 (calc)
eAG (mmol/L): 5.2 (calc)

## 2020-03-07 LAB — TSH: TSH: 2.6 mIU/L

## 2020-03-07 MED ORDER — ROSUVASTATIN CALCIUM 10 MG PO TABS: 10.0000 mg | ORAL_TABLET | Freq: Every day | ORAL | 3 refills | Status: DC

## 2020-03-07 NOTE — Telephone Encounter (Signed)
I went ahead and sent in the cholesterol medication, please call the lab and see if hepatitis C antibody can be added to the blood already in the lab, orders placed.

## 2020-03-07 NOTE — Assessment & Plan Note (Signed)
Starting rosuvastatin 10, recheck lipids in 3 months fasting.

## 2020-03-09 ENCOUNTER — Other Ambulatory Visit: Payer: Self-pay | Admitting: Obstetrics and Gynecology

## 2020-03-23 ENCOUNTER — Other Ambulatory Visit: Payer: Self-pay | Admitting: Obstetrics and Gynecology

## 2020-03-28 MED ORDER — BENZONATATE 200 MG PO CAPS: 200.0000 mg | ORAL_CAPSULE | Freq: Three times a day (TID) | ORAL | 0 refills | Status: DC | PRN

## 2020-04-20 MED FILL — Levonorgestrel IUD 20 MCG/DAY (Initial) (52 MG Total): INTRAUTERINE | Qty: 1 | Status: AC

## 2020-05-18 ENCOUNTER — Other Ambulatory Visit: Payer: Self-pay | Admitting: Sports Medicine

## 2020-05-29 ENCOUNTER — Other Ambulatory Visit: Payer: Self-pay | Admitting: Sports Medicine

## 2020-05-29 DIAGNOSIS — F332 Major depressive disorder, recurrent severe without psychotic features: Secondary | ICD-10-CM

## 2020-07-14 ENCOUNTER — Other Ambulatory Visit: Payer: Self-pay | Admitting: Sports Medicine

## 2020-08-28 ENCOUNTER — Other Ambulatory Visit: Payer: Self-pay | Admitting: Sports Medicine

## 2020-11-14 ENCOUNTER — Other Ambulatory Visit: Payer: Self-pay | Admitting: Sports Medicine

## 2020-11-14 DIAGNOSIS — F332 Major depressive disorder, recurrent severe without psychotic features: Secondary | ICD-10-CM

## 2020-11-26 DIAGNOSIS — G894 Chronic pain syndrome: Secondary | ICD-10-CM

## 2020-11-28 MED ORDER — CYCLOBENZAPRINE HCL 10 MG PO TABS: ORAL_TABLET | ORAL | 3 refills | Status: DC

## 2020-12-02 ENCOUNTER — Other Ambulatory Visit: Payer: Self-pay | Admitting: Sports Medicine

## 2020-12-02 DIAGNOSIS — F332 Major depressive disorder, recurrent severe without psychotic features: Secondary | ICD-10-CM

## 2021-03-04 ENCOUNTER — Other Ambulatory Visit: Payer: Self-pay | Admitting: Sports Medicine

## 2021-06-02 ENCOUNTER — Other Ambulatory Visit: Payer: Self-pay

## 2021-06-02 ENCOUNTER — Emergency Department (INDEPENDENT_AMBULATORY_CARE_PROVIDER_SITE_OTHER)
Admission: RE | Admit: 2021-06-02 | Discharge: 2021-06-02 | Disposition: A | Discharge status: Home / Self Care | Payer: No Typology Code available for payment source | Source: Ambulatory Visit | Attending: Family Medicine | Admitting: Family Medicine

## 2021-06-02 VITALS — BP 163/95 | HR 104 | Temp 98.4°F | Resp 18 | Ht 62.0 in | Wt 310.0 lb

## 2021-06-02 DIAGNOSIS — J069 Acute upper respiratory infection, unspecified: Secondary | ICD-10-CM | POA: Diagnosis not present

## 2021-06-02 MED ORDER — PREDNISONE 20 MG PO TABS: ORAL_TABLET | ORAL | 0 refills | Status: DC

## 2021-06-02 MED ORDER — BENZONATATE 200 MG PO CAPS: ORAL_CAPSULE | ORAL | 0 refills | Status: DC

## 2021-06-02 NOTE — Discharge Instructions (Signed)
Take plain guaifenesin (1200mg  extended release tabs such as Mucinex) twice daily, with plenty of water, for cough and congestion.  May add Pseudoephedrine (30mg , one or two every 4 to 6 hours) for sinus congestion.  Get adequate rest.    Also recommend using saline nasal spray several times daily and saline nasal irrigation (AYR is a common brand).    Try warm salt water gargles for sore throat.  Stop all antihistamines (Nyquil, etc) for now, and other non-prescription cough/cold preparations. May take Delsym Cough Suppressant ("12 Hour Cough Relief") at bedtime for nighttime cough.

## 2021-06-02 NOTE — ED Triage Notes (Signed)
Pt states that she has a cough. X5 days  Pt states that she had a negative covid test 2/1.  Pt denies any other symptoms.  Pt states that she is vaccinated for covid. Pt states that she hasn't had flu vaccine.

## 2021-06-02 NOTE — ED Provider Notes (Signed)
[facility]    CSN: [id number] Arrival date & time: 06/02/21  1539      History   Chief Complaint Chief Complaint  Patient presents with   Cough    Cough. X5 days    HPI Regina [name] is a 46 y.o. female.   Patient developed a mild non-productive cough about five days ago that has persisted and is worse at night.  She feels well otherwise and denies other symptoms.  She reports that she has had two negative home COVID19 tests.   Family history of asthma:  mother and daughter  The history is provided by the patient.   Past Medical History:  Diagnosis Date   Anemia 4098   Complication of anesthesia    Maybe trouble waking up once   Depression    Depression with anxiety    Migraine    Morbid obesity ([place])    Panic disorder    Seizures ([place])     Patient Active Problem List   Diagnosis Date Noted   Symptomatic anemia 01/17/2020   Dysfunctional uterine bleeding 01/17/2020   Major depressive disorder 01/17/2020   Panic disorder 01/17/2020   Microcytic anemia 10/24/2018   GERD (gastroesophageal reflux disease) 09/29/2018   Shingles 12/31/2017   Macromastia 10/28/2015   Severe episode of recurrent major depressive disorder, without psychotic features ([place])    Opioid use disorder, moderate, dependence ([place]) 10/20/2015   Chronic pain syndrome 10/20/2015   Low back pain 07/22/2015   Excessive daytime sleepiness 10/28/2014   Left cervical radiculopathy 04/02/2014   Insomnia 12/01/2013   Migraine without aura 08/04/2013   Hyperlipidemia 03/11/2012   Annual physical exam 03/10/2012   Obesity 03/10/2012   TERATOMA 03/07/2010    Past Surgical History:  Procedure Laterality Date   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   DILATATION & CURETTAGE/HYSTEROSCOPY WITH MYOSURE N/A 02/16/2020   Procedure: DILATATION & CURETTAGE/HYSTEROSCOPY WITH MYOSURE;  Surgeon: Sloan Leiter, MD;  Location: [place];  Service: Gynecology;  Laterality: N/A;   DILATION AND CURETTAGE,  DIAGNOSTIC / THERAPEUTIC     INTRAUTERINE DEVICE (IUD) INSERTION N/A 02/16/2020   Procedure: INTRAUTERINE DEVICE (IUD) INSERTION;  Surgeon: Sloan Leiter, MD;  Location: [place];  Service: Gynecology;  Laterality: N/A;  IUD coming from inpatient pharmacy   SOFT TISSUE TUMOR RESECTION  06/17/07   chest/ wound reclosure 4/09'    OB History     Gravida  5   Para  3   Term  3   Preterm      AB  0   Living  3      SAB      IAB      Ectopic      Multiple      Live Births               Home Medications    Prior to Admission medications   Medication Sig Start Date End Date Taking? Authorizing Provider  benzonatate (TESSALON) 200 MG capsule Take one cap PO HS PRN cough 06/02/21  Yes Geraldyn Shain, Ishmael Holter, MD  cyclobenzaprine (FLEXERIL) 10 MG tablet TAKE 1 TABLET BY MOUTH THREE TIMES A DAY AS NEEDED FOR MUSCLE SPASMS 11/28/20  Yes Silverio Decamp, MD  ferrous sulfate 325 (65 FE) MG tablet TAKE 1 TABLET BY MOUTH TWICE A DAY 03/23/20  Yes Sloan Leiter, MD  hydrOXYzine (ATARAX/VISTARIL) 50 MG tablet Take 1 tablet (50 mg total) by mouth at bedtime. 08/29/20  Yes Silverio Decamp, MD  ibuprofen (ADVIL) 600 MG tablet Take 1 tablet (600 mg total) by mouth every 6 (six) hours as needed for headache, mild pain, moderate pain or cramping. 02/16/20  Yes Sloan Leiter, MD  levonorgestrel (MIRENA) 20 MCG/24HR IUD 1 Intra Uterine Device (1 each total) by Intrauterine route once for 1 dose. Pt to have placed in OR @ [facility] by Dr Rosana Hoes 02/16/20.  [facility]  Covered under Pharmacy benefits 01/27/20 06/02/21 Yes Sloan Leiter, MD  pantoprazole (PROTONIX) 40 MG tablet Take 1 tablet (40 mg total) by mouth daily. 09/14/19  Yes Silverio Decamp, MD  predniSONE (DELTASONE) 20 MG tablet Take one tab by mouth twice daily for 3 days, then one daily. Take with food. 06/02/21  Yes Kandra Nicolas, MD  traZODone (DESYREL) 50 MG tablet  TAKE 1 TABLET BY MOUTH EVERYDAY AT BEDTIME 12/02/20  Yes Silverio Decamp, MD  venlafaxine XR (EFFEXOR-XR) 75 MG 24 hr capsule TAKE 3 CAPSULES (225 MG TOTAL) BY MOUTH DAILY WITH BREAKFAST. 11/14/20  Yes Silverio Decamp, MD  ondansetron (ZOFRAN ODT) 4 MG disintegrating tablet 4mg  ODT q4 hours prn nausea/vomit Patient taking differently: Take 4 mg by mouth every 4 (four) hours as needed (nausea related to migraines). 4mg  ODT q4 hours prn nausea/vomit 06/02/18   Noe Gens, PA-C  rizatriptan (MAXALT-MLT) 5 MG disintegrating tablet Take 1 tablet (5 mg total) by mouth as needed for migraine. May repeat in 2 hours if needed Patient taking differently: Take 5 mg by mouth See admin instructions. Take one tablet (5 mg) at onset of migraine, may repeat in 2 hours if needed 12/31/17   Silverio Decamp, MD  rosuvastatin (CRESTOR) 10 MG tablet Take 1 tablet (10 mg total) by mouth daily. 03/07/20   Silverio Decamp, MD    Family History Family History  Problem Relation Age of Onset   Hypertension Mother    Migraines Mother    Depression Father    Suicidality Father     Social History Social History   Tobacco Use   Smoking status: Never   Smokeless tobacco: Never  Vaping Use   Vaping Use: Never used  Substance Use Topics   Alcohol use: No   Drug use: No     Allergies   Codeine and Sumatriptan   Review of Systems Review of Systems No sore throat + cough No pleuritic pain No wheezing No nasal congestion No post-nasal drainage No sinus pain/pressure No itchy/red eyes No earache No hemoptysis No SOB No fever/chills No nausea No vomiting No abdominal pain No diarrhea No urinary symptoms No skin rash No fatigue No myalgias No headache   Physical Exam Triage Vital Signs ED Triage Vitals  Enc Vitals Group     BP 06/02/21 1609 (!) 163/95     Pulse Rate 06/02/21 1609 (!) 104     Resp 06/02/21 1609 18     Temp 06/02/21 1609 98.4 F (36.9 C)     Temp  Source 06/02/21 1609 Oral     SpO2 06/02/21 1609 96 %     Weight 06/02/21 1605 (!) 310 lb (140.6 kg)     Height 06/02/21 1605 5\' 2"  (1.575 m)     Head Circumference --      Peak Flow --      Pain Score 06/02/21 1605 0     Pain Loc --  Pain Edu? --      Excl. in [place]? --    No data found.  Updated Vital Signs BP (!) 163/95 (BP Location: Left Arm)    Pulse (!) 104    Temp 98.4 F (36.9 C) (Oral)    Resp 18    Ht 5\' 2"  (1.575 m)    Wt (!) 140.6 kg    SpO2 96%    BMI 56.70 kg/m   Visual Acuity Right Eye Distance:   Left Eye Distance:   Bilateral Distance:    Right Eye Near:   Left Eye Near:    Bilateral Near:     Physical Exam Nursing notes and Vital Signs reviewed. Appearance:  Patient appears stated age, and in no acute distress Eyes:  Pupils are equal, round, and reactive to light and accomodation.  Extraocular movement is intact.  Conjunctivae are not inflamed  Ears:  Canals normal.  Tympanic membranes normal.  Nose:  Normal turbinates.  No sinus tenderness.   Pharynx:  Normal Neck:  Supple.  Shotty nontender lateral nodes. Lungs:  Clear to auscultation.  Breath sounds are equal.  Moving air well. Heart:  Regular rate and rhythm without murmurs, rubs, or gallops.  Abdomen:  Nontender without masses or hepatosplenomegaly.  Bowel sounds are present.  No CVA or flank tenderness.  Extremities:  No edema.  Skin:  No rash present.   UC Treatments / Results  Labs (all labs ordered are listed, but only abnormal results are displayed) Labs Reviewed - No data to display  EKG   Radiology No results found.  Procedures Procedures (including critical care time)  Medications Ordered in UC Medications - No data to display  Initial Impression / Assessment and Plan / UC Course  I have reviewed the triage vital signs and the nursing notes.  Pertinent labs & imaging results that were available during my care of the patient were reviewed by me and considered in my medical  decision making (see chart for details).    Benign exam.  There is no evidence of bacterial infection today.   Suspect mild reactive airways disease (note family history of asthma mother and daughter). Begin prednisone burst/taper.  Refill Tessalon at patient's request. Followup with Family Doctor if not improved in one week.   Final Clinical Impressions(s) / UC Diagnoses   Final diagnoses:  Viral URI with cough     Discharge Instructions      Take plain guaifenesin (1200mg  extended release tabs such as Mucinex) twice daily, with plenty of water, for cough and congestion.  May add Pseudoephedrine (30mg , one or two every 4 to 6 hours) for sinus congestion.  Get adequate rest.    Also recommend using saline nasal spray several times daily and saline nasal irrigation (AYR is a common brand).    Try warm salt water gargles for sore throat.  Stop all antihistamines (Nyquil, etc) for now, and other non-prescription cough/cold preparations. May take Delsym Cough Suppressant ("12 Hour Cough Relief") at bedtime for nighttime cough.     ED Prescriptions     Medication Sig Dispense Auth. Provider   predniSONE (DELTASONE) 20 MG tablet Take one tab by mouth twice daily for 3 days, then one daily. Take with food. 9 tablet Kandra Nicolas, MD   benzonatate (TESSALON) 200 MG capsule Take one cap PO HS PRN cough 12 capsule Kandra Nicolas, MD         Kandra Nicolas, MD 06/05/21 [phone]

## 2021-06-10 ENCOUNTER — Other Ambulatory Visit: Payer: Self-pay | Admitting: Sports Medicine

## 2021-06-10 DIAGNOSIS — F332 Major depressive disorder, recurrent severe without psychotic features: Secondary | ICD-10-CM

## 2021-07-31 ENCOUNTER — Other Ambulatory Visit: Payer: Self-pay | Admitting: Sports Medicine

## 2021-07-31 ENCOUNTER — Telehealth: Payer: Self-pay | Admitting: Sports Medicine

## 2021-07-31 DIAGNOSIS — F332 Major depressive disorder, recurrent severe without psychotic features: Secondary | ICD-10-CM

## 2021-07-31 MED ORDER — VENLAFAXINE HCL ER 75 MG PO CP24: 225.0000 mg | ORAL_CAPSULE | Freq: Every day | ORAL | 0 refills | Status: DC

## 2021-07-31 NOTE — Telephone Encounter (Signed)
Pt is requesting a med refill for Effexor.  ?

## 2021-07-31 NOTE — Telephone Encounter (Signed)
Sent message via MyChart in reference to the need to schedule an appt in order to get further refills. ?

## 2021-08-11 ENCOUNTER — Other Ambulatory Visit: Payer: Self-pay | Admitting: Sports Medicine

## 2021-08-14 ENCOUNTER — Encounter: Payer: No Typology Code available for payment source | Admitting: Sports Medicine

## 2021-08-31 ENCOUNTER — Encounter: Payer: Self-pay | Admitting: Sports Medicine

## 2021-09-01 ENCOUNTER — Ambulatory Visit (INDEPENDENT_AMBULATORY_CARE_PROVIDER_SITE_OTHER): Payer: No Typology Code available for payment source | Admitting: Sports Medicine

## 2021-09-01 VITALS — BP 138/87 | HR 82 | Ht 62.0 in | Wt 317.0 lb

## 2021-09-01 DIAGNOSIS — G894 Chronic pain syndrome: Secondary | ICD-10-CM

## 2021-09-01 DIAGNOSIS — Z6841 Body Mass Index (BMI) 40.0 and over, adult: Secondary | ICD-10-CM

## 2021-09-01 DIAGNOSIS — E785 Hyperlipidemia, unspecified: Secondary | ICD-10-CM

## 2021-09-01 DIAGNOSIS — Z23 Encounter for immunization: Secondary | ICD-10-CM

## 2021-09-01 DIAGNOSIS — Z1211 Encounter for screening for malignant neoplasm of colon: Secondary | ICD-10-CM

## 2021-09-01 DIAGNOSIS — Z0000 Encounter for general adult medical examination without abnormal findings: Secondary | ICD-10-CM

## 2021-09-01 DIAGNOSIS — K219 Gastro-esophageal reflux disease without esophagitis: Secondary | ICD-10-CM

## 2021-09-01 DIAGNOSIS — F332 Major depressive disorder, recurrent severe without psychotic features: Secondary | ICD-10-CM

## 2021-09-01 DIAGNOSIS — R7989 Other specified abnormal findings of blood chemistry: Secondary | ICD-10-CM

## 2021-09-01 MED ORDER — ROSUVASTATIN CALCIUM 10 MG PO TABS: 10.0000 mg | ORAL_TABLET | Freq: Every day | ORAL | 3 refills | Status: DC

## 2021-09-01 MED ORDER — RIZATRIPTAN BENZOATE 5 MG PO TBDP: 5.0000 mg | ORAL_TABLET | ORAL | 2 refills | Status: DC | PRN

## 2021-09-01 MED ORDER — ONDANSETRON 4 MG PO TBDP: ORAL_TABLET | ORAL | 11 refills | Status: DC

## 2021-09-01 MED ORDER — HYDROXYZINE HCL 50 MG PO TABS: 50.0000 mg | ORAL_TABLET | Freq: Every day | ORAL | 3 refills | Status: DC

## 2021-09-01 MED ORDER — FERROUS SULFATE 325 (65 FE) MG PO TABS: 325.0000 mg | ORAL_TABLET | Freq: Two times a day (BID) | ORAL | 3 refills | Status: DC

## 2021-09-01 MED ORDER — PANTOPRAZOLE SODIUM 40 MG PO TBEC: 40.0000 mg | DELAYED_RELEASE_TABLET | Freq: Every day | ORAL | 3 refills | Status: DC

## 2021-09-01 MED ORDER — IBUPROFEN 600 MG PO TABS: 600.0000 mg | ORAL_TABLET | Freq: Four times a day (QID) | ORAL | 1 refills | Status: AC | PRN

## 2021-09-01 MED ORDER — VENLAFAXINE HCL ER 75 MG PO CP24: 225.0000 mg | ORAL_CAPSULE | Freq: Every day | ORAL | 3 refills | Status: DC

## 2021-09-01 MED ORDER — TRAZODONE HCL 50 MG PO TABS: ORAL_TABLET | ORAL | 3 refills | Status: DC

## 2021-09-01 NOTE — Assessment & Plan Note (Signed)
Regina [name] does need to lose a lot of weight, she is agreeable to proceed with bariatric surgery, referral placed. ?

## 2021-09-01 NOTE — Progress Notes (Addendum)
Subjective:    CC: Annual Physical Exam  HPI:  This patient is here for their annual physical  I reviewed the past medical history, family history, social history, surgical history, and allergies today and no changes were needed.  Please see the problem list section below in epic for further details.  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   DILATATION & CURETTAGE/HYSTEROSCOPY WITH MYOSURE N/A 02/16/2020   Procedure: DILATATION & CURETTAGE/HYSTEROSCOPY WITH MYOSURE;  Surgeon: Sloan Leiter, MD;  Location: [place];  Service: Gynecology;  Laterality: N/A;   DILATION AND CURETTAGE, DIAGNOSTIC / THERAPEUTIC     INTRAUTERINE DEVICE (IUD) INSERTION N/A 02/16/2020   Procedure: INTRAUTERINE DEVICE (IUD) INSERTION;  Surgeon: Sloan Leiter, MD;  Location: [place];  Service: Gynecology;  Laterality: N/A;  IUD coming from inpatient pharmacy   SOFT TISSUE TUMOR RESECTION  06/17/07   chest/ wound reclosure 4/09'   Social History: Social History   Socioeconomic History   Marital status: Married    Spouse name: Not on file   Number of children: Not on file   Years of education: Not on file   Highest education level: Not on file  Occupational History   Not on file  Tobacco Use   Smoking status: Never   Smokeless tobacco: Never  Vaping Use   Vaping Use: Never used  Substance and Sexual Activity   Alcohol use: No   Drug use: No   Sexual activity: Yes    Birth control/protection: Condom  Other Topics Concern   Not on file  Social History Narrative   Not on file   Social Determinants of Health   Financial Resource Strain: Not on file  Food Insecurity: Not on  file  Transportation Needs: Not on file  Physical Activity: Not on file  Stress: Not on file  Social Connections: Not on file   Family History: Family History  Problem Relation Age of Onset   Hypertension Mother    Migraines Mother    Depression Father    Suicidality Father    Allergies: Allergies  Allergen Reactions   Codeine Itching   Sumatriptan Other (See Comments)    Palpitations   Medications: See med rec.  Review of Systems: No headache, visual changes, nausea, vomiting, diarrhea, constipation, dizziness, abdominal pain, skin rash, fevers, chills, night sweats, swollen lymph nodes, weight loss, chest pain, body aches, joint swelling, muscle aches, shortness of breath, mood changes, visual or auditory hallucinations.  Objective:    General: Well Developed, well nourished, and in no acute distress.  Neuro: Alert and oriented x3, extra-ocular muscles intact, sensation grossly intact. Cranial nerves II through XII are intact, motor, sensory, and coordinative functions are all intact. HEENT: Normocephalic, atraumatic, pupils equal round reactive to light, neck supple, no masses, no lymphadenopathy, thyroid nonpalpable. Oropharynx, nasopharynx, external ear canals are unremarkable. Skin: Warm and dry, no rashes noted.  Cardiac: Regular rate and rhythm, no murmurs rubs or gallops.  Respiratory: Clear to auscultation bilaterally. Not using accessory muscles, speaking in full sentences.  Abdominal: Soft, nontender, nondistended, positive bowel sounds, no masses,  no organomegaly.  Musculoskeletal: Shoulder, elbow, wrist, hip, knee, ankle stable, and with full range of motion.  Impression and Recommendations:    The patient was counselled, risk factors were discussed, anticipatory guidance given.  Annual physical exam Regina [name] returns, she is a pleasant 46 year old female here for her physical, we will go and start colon cancer screening, and get routine labs. Tdap  today.  Obesity Regina [name] does need to lose a lot of weight, she is agreeable to proceed with bariatric surgery, referral placed.  Elevated TSH Suspect subclinical hypothyroidism, adding T3 and T4 levels, we will recheck in 2 to 3 months.  Hyperlipidemia Lipids still up on Crestor 10, increasing to 20 mg, recheck fasting lipids 2 to 3 months.   ___________________________________________ Gwen Her. Dianah Field, M.D., ABFM., CAQSM. Primary Care and Sports Medicine [facility]  Adjunct Professor of [employer]

## 2021-09-01 NOTE — Addendum Note (Signed)
Addended by: Dema Severin on: 09/01/2021 10:55 AM ? ? Modules accepted: Orders ? ?

## 2021-09-01 NOTE — Assessment & Plan Note (Addendum)
Regina [name] returns, she is a pleasant 46 year old female here for her physical, we will go and start colon cancer screening, and get routine labs. ?Tdap today. ?

## 2021-09-06 ENCOUNTER — Encounter (INDEPENDENT_AMBULATORY_CARE_PROVIDER_SITE_OTHER): Payer: No Typology Code available for payment source | Admitting: Sports Medicine

## 2021-09-06 DIAGNOSIS — L304 Erythema intertrigo: Secondary | ICD-10-CM

## 2021-09-07 DIAGNOSIS — L304 Erythema intertrigo: Secondary | ICD-10-CM | POA: Insufficient documentation

## 2021-09-07 MED ORDER — CLOTRIMAZOLE-BETAMETHASONE 1-0.05 % EX CREA: 1.0000 "application " | TOPICAL_CREAM | Freq: Two times a day (BID) | CUTANEOUS | 3 refills | Status: DC

## 2021-09-07 NOTE — Telephone Encounter (Signed)
I spent 5 total minutes of online digital evaluation and management services in this patient-initiated request for online care. 

## 2021-09-07 NOTE — Assessment & Plan Note (Signed)
Adding Lotrisone, recheck in 2 weeks ?

## 2021-10-11 ENCOUNTER — Encounter: Payer: Self-pay | Admitting: Sports Medicine

## 2021-10-11 NOTE — Telephone Encounter (Signed)
Whole heck of a lotta questions here, lets set her up with a phone call.

## 2021-10-12 DIAGNOSIS — R7989 Other specified abnormal findings of blood chemistry: Secondary | ICD-10-CM | POA: Insufficient documentation

## 2021-10-12 LAB — LIPID PANEL
Cholesterol: 209 mg/dL — ABNORMAL HIGH (ref <200)
HDL: 41 mg/dL — ABNORMAL LOW (ref >=50)
LDL Cholesterol (Calc): 146 mg/dL (calc) — ABNORMAL HIGH
Non-HDL Cholesterol (Calc): 168 mg/dL (calc) — ABNORMAL HIGH (ref <130)
Total CHOL/HDL Ratio: 5.1 (calc) — ABNORMAL HIGH (ref <5.0)
Triglycerides: 108 mg/dL (ref <150)

## 2021-10-12 LAB — CBC
HCT: 39 % (ref 35.0–45.0)
Hemoglobin: 12 g/dL (ref 11.7–15.5)
MCH: 22.3 pg — ABNORMAL LOW (ref 27.0–33.0)
MCHC: 30.8 g/dL — ABNORMAL LOW (ref 32.0–36.0)
MCV: 72.4 fL — ABNORMAL LOW (ref 80.0–100.0)
MPV: 8.6 fL (ref 7.5–12.5)
Platelets: 373 10*3/uL (ref 140–400)
RBC: 5.39 10*6/uL — ABNORMAL HIGH (ref 3.80–5.10)
RDW: 14.6 % (ref 11.0–15.0)
WBC: 8.2 10*3/uL (ref 3.8–10.8)

## 2021-10-12 LAB — COMPREHENSIVE METABOLIC PANEL
AG Ratio: 1.4 (calc) (ref 1.0–2.5)
ALT: 21 U/L (ref 6–29)
AST: 19 U/L (ref 10–35)
Albumin: 4.1 g/dL (ref 3.6–5.1)
Alkaline phosphatase (APISO): 134 U/L — ABNORMAL HIGH (ref 31–125)
BUN: 11 mg/dL (ref 7–25)
CO2: 30 mmol/L (ref 20–32)
Calcium: 9.2 mg/dL (ref 8.6–10.2)
Chloride: 101 mmol/L (ref 98–110)
Creat: 0.66 mg/dL (ref 0.50–0.99)
Globulin: 3 g/dL (calc) (ref 1.9–3.7)
Glucose, Bld: 92 mg/dL (ref 65–99)
Potassium: 4.3 mmol/L (ref 3.5–5.3)
Sodium: 139 mmol/L (ref 135–146)
Total Bilirubin: 0.3 mg/dL (ref 0.2–1.2)
Total Protein: 7.1 g/dL (ref 6.1–8.1)

## 2021-10-12 LAB — HEMOGLOBIN A1C
Hgb A1c MFr Bld: 5.8 % of total Hgb — ABNORMAL HIGH (ref <5.7)
Mean Plasma Glucose: 120 mg/dL
eAG (mmol/L): 6.6 mmol/L

## 2021-10-12 LAB — TSH: TSH: 5.12 mIU/L — ABNORMAL HIGH

## 2021-10-12 MED ORDER — ROSUVASTATIN CALCIUM 20 MG PO TABS: 20.0000 mg | ORAL_TABLET | Freq: Every day | ORAL | 3 refills | Status: DC

## 2021-10-12 NOTE — Telephone Encounter (Signed)
LVM for patient to call back to get virtual/phone visit scheduled. AMUCK

## 2021-10-12 NOTE — Assessment & Plan Note (Signed)
Lipids still up on Crestor 10, increasing to 20 mg, recheck fasting lipids 2 to 3 months.

## 2021-10-12 NOTE — Addendum Note (Signed)
Addended by: Silverio Decamp on: 10/12/2021 08:10 PM   Modules accepted: Orders

## 2021-10-12 NOTE — Assessment & Plan Note (Signed)
Suspect subclinical hypothyroidism, adding T3 and T4 levels, we will recheck in 2 to 3 months.

## 2021-10-17 ENCOUNTER — Encounter (INDEPENDENT_AMBULATORY_CARE_PROVIDER_SITE_OTHER): Payer: No Typology Code available for payment source | Admitting: Sports Medicine

## 2021-10-17 ENCOUNTER — Telehealth (INDEPENDENT_AMBULATORY_CARE_PROVIDER_SITE_OTHER): Payer: No Typology Code available for payment source | Admitting: Sports Medicine

## 2021-10-17 ENCOUNTER — Encounter: Payer: No Typology Code available for payment source | Admitting: Sports Medicine

## 2021-10-17 DIAGNOSIS — R202 Paresthesia of skin: Secondary | ICD-10-CM

## 2021-10-17 DIAGNOSIS — R200 Anesthesia of skin: Secondary | ICD-10-CM

## 2021-10-17 DIAGNOSIS — G4719 Other hypersomnia: Secondary | ICD-10-CM | POA: Diagnosis not present

## 2021-10-17 DIAGNOSIS — G894 Chronic pain syndrome: Secondary | ICD-10-CM

## 2021-10-17 MED ORDER — PREGABALIN 25 MG PO CAPS: 25.0000 mg | ORAL_CAPSULE | Freq: Three times a day (TID) | ORAL | 3 refills | Status: DC

## 2021-10-17 NOTE — Progress Notes (Signed)
   Virtual Visit via WebEx/MyChart   I connected with  Regina [name]  on 10/17/21 via WebEx/MyChart/Doximity Video and verified that I am speaking with the correct person using two identifiers.   I discussed the limitations, risks, security and privacy concerns of performing an evaluation and management service by WebEx/MyChart/Doximity Video, including the higher likelihood of inaccurate diagnosis and treatment, and the availability of in person appointments.  We also discussed the likely need of an additional face to face encounter for complete and high quality delivery of care.  I also discussed with the patient that there may be a patient responsible charge related to this service. The patient expressed understanding and wishes to proceed.  Provider location is in medical facility. Patient location is at their home, different from provider location. People involved in care of the patient during this telehealth encounter were myself, my nurse/medical assistant, and my front office/scheduling team member.  Review of Systems: No fevers, chills, night sweats, weight loss, chest pain, or shortness of breath.   Objective Findings:    General: Speaking full sentences, no audible heavy breathing.  Sounds alert and appropriately interactive.  Appears well.  Face symmetric.  Extraocular movements intact.  Pupils equal and round.  No nasal flaring or accessory muscle use visualized.  Independent interpretation of tests performed by another provider:   None.  Brief History, Exam, Impression, and Recommendations:    Numbness and tingling of both feet This is a pleasant 46 year old female, she has had numbness and tingling both feet on the outer toes, nothing really proximal to the ankle. Not having much back pain. She did have a lumbar spine MRI about 5 years ago that showed no areas of neuroforaminal stenosis. She does get pain when she is on her feet a lot. We will treat this conservatively,  avoidance of barefoot walking, home conditioning, Lyrica 25 mg 3 times daily and as she did not respond well to gabapentin. I like to see her back in about 3 weeks in person so we can do an exam, and we would potentially need imaging of the feet, lumbar spine and potentially nerve conduction EMG.  Excessive daytime sleepiness Due for another sleep study, ordering now. Excessive daytime sleepiness, snoring.   I discussed the above assessment and treatment plan with the patient. The patient was provided an opportunity to ask questions and all were answered. The patient agreed with the plan and demonstrated an understanding of the instructions.   The patient was advised to call back or seek an in-person evaluation if the symptoms worsen or if the condition fails to improve as anticipated.   I provided 30 minutes of face to face and non-face-to-face time during this encounter date, time was needed to gather information, review chart, records, communicate/coordinate with staff remotely, as well as complete documentation.   ___________________________________________ Gwen Her. Dianah Field, M.D., ABFM., CAQSM. Primary Care and [place] Instructor of [employer] [facility] of Medicine

## 2021-10-17 NOTE — Assessment & Plan Note (Signed)
This is a pleasant 46 year old female, she has had numbness and tingling both feet on the outer toes, nothing really proximal to the ankle. Not having much back pain. She did have a lumbar spine MRI about 5 years ago that showed no areas of neuroforaminal stenosis. She does get pain when she is on her feet a lot. We will treat this conservatively, avoidance of barefoot walking, home conditioning, Lyrica 25 mg 3 times daily and as she did not respond well to gabapentin. I like to see her back in about 3 weeks in person so we can do an exam, and we would potentially need imaging of the feet, lumbar spine and potentially nerve conduction EMG.

## 2021-10-17 NOTE — Assessment & Plan Note (Signed)
Due for another sleep study, ordering now. Excessive daytime sleepiness, snoring.

## 2021-11-10 MED ORDER — PREGABALIN 50 MG PO CAPS: 50.0000 mg | ORAL_CAPSULE | Freq: Three times a day (TID) | ORAL | 3 refills | Status: DC

## 2021-11-10 NOTE — Telephone Encounter (Signed)
I spent 5 total minutes of online digital evaluation and management services in this patient-initiated request for online care. 

## 2021-11-30 ENCOUNTER — Telehealth: Payer: Self-pay

## 2021-11-30 DIAGNOSIS — G4719 Other hypersomnia: Secondary | ICD-10-CM

## 2021-11-30 NOTE — Telephone Encounter (Signed)
Home sleep study ordered

## 2021-11-30 NOTE — Telephone Encounter (Signed)
Per Karna Christmas, the pt's insurance has denied and in-lab sleep study but will cover an at home study. Please change the order to an at home sleep study.

## 2021-12-09 ENCOUNTER — Encounter: Payer: Self-pay | Admitting: Sports Medicine

## 2021-12-17 ENCOUNTER — Encounter (INDEPENDENT_AMBULATORY_CARE_PROVIDER_SITE_OTHER): Payer: No Typology Code available for payment source | Admitting: Sports Medicine

## 2021-12-17 DIAGNOSIS — G8929 Other chronic pain: Secondary | ICD-10-CM

## 2021-12-17 DIAGNOSIS — M545 Low back pain, unspecified: Secondary | ICD-10-CM | POA: Diagnosis not present

## 2021-12-18 NOTE — Assessment & Plan Note (Signed)
Increasing low back pain with urinary incontinence, proceeding with lumbar spine MRI, patient does need nerve conduction and EMG as well.

## 2021-12-18 NOTE — Telephone Encounter (Signed)
I spent 5 total minutes of online digital evaluation and management services in this patient-initiated request for online care. 

## 2021-12-24 ENCOUNTER — Ambulatory Visit (INDEPENDENT_AMBULATORY_CARE_PROVIDER_SITE_OTHER): Payer: No Typology Code available for payment source

## 2021-12-24 DIAGNOSIS — M545 Low back pain, unspecified: Secondary | ICD-10-CM

## 2021-12-24 DIAGNOSIS — G8929 Other chronic pain: Secondary | ICD-10-CM | POA: Diagnosis not present

## 2021-12-25 ENCOUNTER — Encounter: Payer: Self-pay | Admitting: Sports Medicine

## 2021-12-25 ENCOUNTER — Other Ambulatory Visit: Payer: Self-pay | Admitting: Sports Medicine

## 2021-12-25 DIAGNOSIS — G894 Chronic pain syndrome: Secondary | ICD-10-CM

## 2022-07-01 ENCOUNTER — Encounter (INDEPENDENT_AMBULATORY_CARE_PROVIDER_SITE_OTHER): Payer: No Typology Code available for payment source | Admitting: Sports Medicine

## 2022-07-01 DIAGNOSIS — R051 Acute cough: Secondary | ICD-10-CM | POA: Diagnosis not present

## 2022-07-04 MED ORDER — BENZONATATE 200 MG PO CAPS: 200.0000 mg | ORAL_CAPSULE | Freq: Three times a day (TID) | ORAL | 0 refills | Status: DC | PRN

## 2022-07-04 NOTE — Telephone Encounter (Signed)
I spent 5 total minutes of online digital evaluation and management services in this patient-initiated request for online care. 

## 2022-07-09 ENCOUNTER — Telehealth: Payer: No Typology Code available for payment source | Admitting: Sports Medicine

## 2022-07-10 NOTE — Progress Notes (Signed)
This encounter was created in error - please disregard.

## 2022-09-12 ENCOUNTER — Ambulatory Visit: Payer: Self-pay

## 2022-09-27 ENCOUNTER — Other Ambulatory Visit: Payer: Self-pay | Admitting: Sports Medicine

## 2022-11-12 ENCOUNTER — Telehealth: Payer: Self-pay | Admitting: Sports Medicine

## 2022-11-12 DIAGNOSIS — E559 Vitamin D deficiency, unspecified: Secondary | ICD-10-CM

## 2022-11-12 DIAGNOSIS — E785 Hyperlipidemia, unspecified: Secondary | ICD-10-CM

## 2022-11-12 DIAGNOSIS — E538 Deficiency of other specified B group vitamins: Secondary | ICD-10-CM

## 2022-11-12 NOTE — Telephone Encounter (Signed)
 Labs ordered.

## 2022-11-12 NOTE — Telephone Encounter (Signed)
Patient made aware they may come in for labs the morning of their appointment as requested and have labs drawn in-office. Regina [name]

## 2022-11-12 NOTE — Telephone Encounter (Signed)
Patient requesting annual physical lab orders be placed ahead of appointment scheduled for 11/23/2022 at [facility]. Patient states she would like to come in the morning of her appointment to have those labs drawn if possible. Please advise. Katha Hamming

## 2022-11-23 ENCOUNTER — Telehealth: Payer: Self-pay

## 2022-11-23 ENCOUNTER — Other Ambulatory Visit: Payer: Self-pay

## 2022-11-23 ENCOUNTER — Encounter: Payer: No Typology Code available for payment source | Admitting: Sports Medicine

## 2022-11-23 DIAGNOSIS — Z0000 Encounter for general adult medical examination without abnormal findings: Secondary | ICD-10-CM

## 2022-11-23 DIAGNOSIS — E559 Vitamin D deficiency, unspecified: Secondary | ICD-10-CM

## 2022-11-23 DIAGNOSIS — E538 Deficiency of other specified B group vitamins: Secondary | ICD-10-CM

## 2022-11-23 DIAGNOSIS — E785 Hyperlipidemia, unspecified: Secondary | ICD-10-CM

## 2022-11-23 NOTE — Telephone Encounter (Signed)
error 

## 2022-11-24 LAB — CBC WITH DIFFERENTIAL/PLATELET
Basophils Absolute: 0 x10E3/uL (ref 0.0–0.2)
Basos: 0 %
EOS (ABSOLUTE): 0.2 x10E3/uL (ref 0.0–0.4)
Eos: 2 %
Hematocrit: 43.6 % (ref 34.0–46.6)
Hemoglobin: 13.9 g/dL (ref 11.1–15.9)
Immature Grans (Abs): 0 x10E3/uL (ref 0.0–0.1)
Immature Granulocytes: 0 %
Lymphocytes Absolute: 1.8 x10E3/uL (ref 0.7–3.1)
Lymphs: 21 %
MCH: 24.6 pg — ABNORMAL LOW (ref 26.6–33.0)
MCHC: 31.9 g/dL (ref 31.5–35.7)
MCV: 77 fL — ABNORMAL LOW (ref 79–97)
Monocytes Absolute: 0.5 x10E3/uL (ref 0.1–0.9)
Monocytes: 5 %
Neutrophils Absolute: 6.1 x10E3/uL (ref 1.4–7.0)
Neutrophils: 72 %
Platelets: 324 x10E3/uL (ref 150–450)
RBC: 5.64 x10E6/uL — ABNORMAL HIGH (ref 3.77–5.28)
RDW: 13.4 % (ref 11.7–15.4)
WBC: 8.6 x10E3/uL (ref 3.4–10.8)

## 2022-11-24 LAB — CMP14+EGFR
ALT: 21 IU/L (ref 0–32)
AST: 23 IU/L (ref 0–40)
Albumin: 4.2 g/dL (ref 3.9–4.9)
Alkaline Phosphatase: 172 IU/L — ABNORMAL HIGH (ref 44–121)
BUN/Creatinine Ratio: 11 (ref 9–23)
BUN: 7 mg/dL (ref 6–24)
Bilirubin Total: 0.3 mg/dL (ref 0.0–1.2)
CO2: 26 mmol/L (ref 20–29)
Calcium: 9.1 mg/dL (ref 8.7–10.2)
Chloride: 99 mmol/L (ref 96–106)
Creatinine, Ser: 0.64 mg/dL (ref 0.57–1.00)
Globulin, Total: 2.9 g/dL (ref 1.5–4.5)
Glucose: 83 mg/dL (ref 70–99)
Potassium: 4.3 mmol/L (ref 3.5–5.2)
Sodium: 139 mmol/L (ref 134–144)
Total Protein: 7.1 g/dL (ref 6.0–8.5)
eGFR: 110 mL/min/1.73 (ref >59)

## 2022-11-24 LAB — LIPID PANEL
Chol/HDL Ratio: 3.8 ratio (ref 0.0–4.4)
Cholesterol, Total: 149 mg/dL (ref 100–199)
HDL: 39 mg/dL — ABNORMAL LOW (ref >39)
LDL Chol Calc (NIH): 86 mg/dL (ref 0–99)
Triglycerides: 138 mg/dL (ref 0–149)
VLDL Cholesterol Cal: 24 mg/dL (ref 5–40)

## 2022-11-24 LAB — VITAMIN B12: Vitamin B-12: 652 pg/mL (ref 232–1245)

## 2022-11-24 LAB — HEMOGLOBIN A1C
Est. average glucose Bld gHb Est-mCnc: 117 mg/dL
Hgb A1c MFr Bld: 5.7 % — ABNORMAL HIGH (ref 4.8–5.6)

## 2022-11-24 LAB — VITAMIN D 25 HYDROXY (VIT D DEFICIENCY, FRACTURES): Vit D, 25-Hydroxy: 10.4 ng/mL — ABNORMAL LOW (ref 30.0–100.0)

## 2022-11-24 LAB — TSH: TSH: 4.3 u[IU]/mL (ref 0.450–4.500)

## 2022-11-26 ENCOUNTER — Other Ambulatory Visit: Payer: Self-pay | Admitting: Sports Medicine

## 2022-11-26 MED ORDER — VITAMIN D (ERGOCALCIFEROL) 1.25 MG (50000 UNIT) PO CAPS: 50000.0000 [IU] | ORAL_CAPSULE | ORAL | 0 refills | Status: DC

## 2022-11-29 NOTE — Telephone Encounter (Signed)
Late entry from WQ list:  Per patient note she sated she did not wish to complete.  Left a message to schedule the appt. 12/04/21 TP.;patient stated she didn't wish to schedule at this time 12/13/21 TP.

## 2022-11-30 ENCOUNTER — Other Ambulatory Visit: Payer: Self-pay | Admitting: Sports Medicine

## 2022-11-30 DIAGNOSIS — F332 Major depressive disorder, recurrent severe without psychotic features: Secondary | ICD-10-CM

## 2022-12-01 ENCOUNTER — Other Ambulatory Visit: Payer: Self-pay | Admitting: Sports Medicine

## 2022-12-01 DIAGNOSIS — E785 Hyperlipidemia, unspecified: Secondary | ICD-10-CM

## 2022-12-04 ENCOUNTER — Encounter: Payer: Self-pay | Admitting: Sports Medicine

## 2022-12-04 ENCOUNTER — Ambulatory Visit (INDEPENDENT_AMBULATORY_CARE_PROVIDER_SITE_OTHER): Payer: Commercial Managed Care - PPO | Admitting: Sports Medicine

## 2022-12-04 VITALS — BP 124/84 | HR 98 | Ht 62.0 in | Wt 300.0 lb

## 2022-12-04 DIAGNOSIS — K219 Gastro-esophageal reflux disease without esophagitis: Secondary | ICD-10-CM | POA: Diagnosis not present

## 2022-12-04 DIAGNOSIS — R200 Anesthesia of skin: Secondary | ICD-10-CM | POA: Diagnosis not present

## 2022-12-04 DIAGNOSIS — Z1211 Encounter for screening for malignant neoplasm of colon: Secondary | ICD-10-CM | POA: Diagnosis not present

## 2022-12-04 DIAGNOSIS — F332 Major depressive disorder, recurrent severe without psychotic features: Secondary | ICD-10-CM | POA: Diagnosis not present

## 2022-12-04 DIAGNOSIS — G894 Chronic pain syndrome: Secondary | ICD-10-CM | POA: Diagnosis not present

## 2022-12-04 DIAGNOSIS — Z6841 Body Mass Index (BMI) 40.0 and over, adult: Secondary | ICD-10-CM

## 2022-12-04 DIAGNOSIS — E785 Hyperlipidemia, unspecified: Secondary | ICD-10-CM | POA: Diagnosis not present

## 2022-12-04 DIAGNOSIS — Z0000 Encounter for general adult medical examination without abnormal findings: Secondary | ICD-10-CM | POA: Diagnosis not present

## 2022-12-04 DIAGNOSIS — R202 Paresthesia of skin: Secondary | ICD-10-CM | POA: Diagnosis not present

## 2022-12-04 MED ORDER — TRAZODONE HCL 50 MG PO TABS
50.0000 mg | ORAL_TABLET | Freq: Every day | ORAL | 3 refills | Status: DC
Start: 2022-12-04 — End: 2023-03-13

## 2022-12-04 MED ORDER — VENLAFAXINE HCL ER 75 MG PO CP24
225.0000 mg | ORAL_CAPSULE | Freq: Every day | ORAL | 3 refills | Status: DC
Start: 2022-12-04 — End: 2023-12-27
  Filled 2023-02-25: qty 270, 90d supply, fill #0
  Filled 2023-06-10: qty 270, 90d supply, fill #1
  Filled 2023-10-14 (×2): qty 270, 90d supply, fill #2

## 2022-12-04 MED ORDER — RIZATRIPTAN BENZOATE 5 MG PO TBDP: 5.0000 mg | ORAL_TABLET | ORAL | 2 refills | Status: DC | PRN

## 2022-12-04 MED ORDER — ROSUVASTATIN CALCIUM 20 MG PO TABS
20.0000 mg | ORAL_TABLET | Freq: Every day | ORAL | 3 refills | Status: AC
  Filled 2023-04-15: qty 90, 90d supply, fill #0

## 2022-12-04 MED ORDER — HYDROXYZINE HCL 50 MG PO TABS
50.0000 mg | ORAL_TABLET | Freq: Every day | ORAL | 3 refills | Status: DC
  Filled 2023-04-15: qty 90, 90d supply, fill #0

## 2022-12-04 MED ORDER — PREGABALIN 50 MG PO CAPS
50.0000 mg | ORAL_CAPSULE | Freq: Three times a day (TID) | ORAL | 3 refills | Status: DC
Start: 2022-12-04 — End: 2023-12-05

## 2022-12-04 MED ORDER — PANTOPRAZOLE SODIUM 40 MG PO TBEC
40.0000 mg | DELAYED_RELEASE_TABLET | Freq: Every day | ORAL | 3 refills | Status: DC
Start: 2022-12-04 — End: 2023-12-27
  Filled 2023-04-15: qty 90, 90d supply, fill #0

## 2022-12-04 NOTE — Progress Notes (Signed)
Subjective:    CC: Annual Physical Exam  HPI:  This patient is here for their annual physical  I reviewed the past medical history, family history, social history, surgical history, and allergies today and no changes were needed.  Please see the problem list section below in epic for further details.  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   DILATATION & CURETTAGE/HYSTEROSCOPY WITH MYOSURE N/A 02/16/2020   Procedure: DILATATION & CURETTAGE/HYSTEROSCOPY WITH MYOSURE;  Surgeon: Conan Bowens, MD;  Location: [facility] OR;  Service: Gynecology;  Laterality: N/A;   DILATION AND CURETTAGE, DIAGNOSTIC / THERAPEUTIC     INTRAUTERINE DEVICE (IUD) INSERTION N/A 02/16/2020   Procedure: INTRAUTERINE DEVICE (IUD) INSERTION;  Surgeon: Conan Bowens, MD;  Location: [facility] OR;  Service: Gynecology;  Laterality: N/A;  IUD coming from inpatient pharmacy   SOFT TISSUE TUMOR RESECTION  06/17/07   chest/ wound reclosure 4/09'   Social History: Social History   Socioeconomic History   Marital status: Married    Spouse name: Not on file   Number of children: Not on file   Years of education: Not on file   Highest education level: Not on file  Occupational History   Not on file  Tobacco Use   Smoking status: Never   Smokeless tobacco: Never  Vaping Use   Vaping status: Never Used  Substance and Sexual Activity   Alcohol use: No   Drug use: No   Sexual activity: Yes    Birth control/protection: Condom  Other Topics Concern   Not on file  Social History Narrative   Not on file   Social Determinants of Health   Financial Resource Strain: Not on file  Food Insecurity: Not  on file  Transportation Needs: Not on file  Physical Activity: Not on file  Stress: Not on file  Social Connections: Not on file   Family History: Family History  Problem Relation Age of Onset   Hypertension Mother    Migraines Mother    Depression Father    Suicidality Father    Allergies: Allergies  Allergen Reactions   Codeine Itching   Sumatriptan Other (See Comments)    Palpitations   Medications: See med rec.  Review of Systems: No headache, visual changes, nausea, vomiting, diarrhea, constipation, dizziness, abdominal pain, skin rash, fevers, chills, night sweats, swollen lymph nodes, weight loss, chest pain, body aches, joint swelling, muscle aches, shortness of breath, mood changes, visual or auditory hallucinations.  Objective:    General: Well Developed, well nourished, and in no acute distress.  Neuro: Alert and oriented x3, extra-ocular muscles intact, sensation grossly intact. Cranial nerves II through XII are intact, motor, sensory, and coordinative functions are all intact. HEENT: Normocephalic, atraumatic, pupils equal round reactive to light, neck supple, no masses, no lymphadenopathy, thyroid nonpalpable. Oropharynx, nasopharynx, external ear canals are unremarkable. Skin: Warm and dry, no rashes noted.  Cardiac: Regular rate and rhythm, no murmurs rubs or gallops.  Respiratory: Clear to auscultation bilaterally. Not using accessory muscles, speaking in full sentences.  Abdominal: Soft, nontender, nondistended, positive bowel sounds, no masses,  no organomegaly.  Musculoskeletal: Shoulder, elbow, wrist, hip, knee, ankle stable, and with full range of motion.  Impression and Recommendations:    The patient was counselled, risk factors were discussed, anticipatory guidance given.  Annual physical exam Annual physical as above, updated on screenings except colon cancer, adding Cologuard. Return to see me in a year.  Obesity We can work really hard to help  her lose some weight, she does have a binge eating disorder, we will get her set up with a behavioral therapist that has some experience with eating disorders, she is interested in compounded semaglutide, she will talk to her husband and let me know if she would like to proceed.   ____________________________________________ Ihor Austin. Benjamin Stain, M.D., ABFM., CAQSM., AME. Primary Care and Sports Medicine [facility]  Adjunct Professor of Family Medicine  [place] [employer] [facility] [employer] [facility] [employer] Medicine  Restaurant manager, fast food

## 2022-12-04 NOTE — Assessment & Plan Note (Signed)
We can work really hard to help her lose some weight, she does have a binge eating disorder, we will get her set up with a behavioral therapist that has some experience with eating disorders, she is interested in compounded semaglutide, she will talk to her husband and let me know if she would like to proceed.

## 2022-12-04 NOTE — Assessment & Plan Note (Signed)
Annual physical as above, updated on screenings except colon cancer, adding Cologuard. Return to see me in a year.

## 2022-12-09 DIAGNOSIS — Z1211 Encounter for screening for malignant neoplasm of colon: Secondary | ICD-10-CM | POA: Diagnosis not present

## 2022-12-14 LAB — COLOGUARD: COLOGUARD: NEGATIVE

## 2023-02-20 ENCOUNTER — Other Ambulatory Visit (HOSPITAL_BASED_OUTPATIENT_CLINIC_OR_DEPARTMENT_OTHER): Payer: Self-pay

## 2023-02-22 ENCOUNTER — Other Ambulatory Visit (HOSPITAL_BASED_OUTPATIENT_CLINIC_OR_DEPARTMENT_OTHER): Payer: Self-pay

## 2023-02-25 ENCOUNTER — Other Ambulatory Visit (HOSPITAL_BASED_OUTPATIENT_CLINIC_OR_DEPARTMENT_OTHER): Payer: Self-pay

## 2023-02-26 ENCOUNTER — Other Ambulatory Visit (HOSPITAL_BASED_OUTPATIENT_CLINIC_OR_DEPARTMENT_OTHER): Payer: Self-pay

## 2023-02-26 MED ORDER — ERGOCALCIFEROL 1.25 MG (50000 UT) PO CAPS: 50000.0000 [IU] | ORAL_CAPSULE | ORAL | 0 refills | Status: AC

## 2023-03-13 ENCOUNTER — Other Ambulatory Visit: Payer: Self-pay | Admitting: Sports Medicine

## 2023-03-13 DIAGNOSIS — F332 Major depressive disorder, recurrent severe without psychotic features: Secondary | ICD-10-CM

## 2023-03-26 ENCOUNTER — Ambulatory Visit: Payer: Commercial Managed Care - PPO

## 2023-04-15 ENCOUNTER — Other Ambulatory Visit (HOSPITAL_BASED_OUTPATIENT_CLINIC_OR_DEPARTMENT_OTHER): Payer: Self-pay

## 2023-04-16 ENCOUNTER — Other Ambulatory Visit (HOSPITAL_BASED_OUTPATIENT_CLINIC_OR_DEPARTMENT_OTHER): Payer: Self-pay

## 2023-04-23 ENCOUNTER — Other Ambulatory Visit (HOSPITAL_BASED_OUTPATIENT_CLINIC_OR_DEPARTMENT_OTHER): Payer: Self-pay

## 2023-04-23 MED FILL — Trazodone HCl Tab 50 MG: ORAL | 90 days supply | Qty: 90 | Fill #0 | Status: AC

## 2023-07-05 DIAGNOSIS — H5213 Myopia, bilateral: Secondary | ICD-10-CM | POA: Diagnosis not present

## 2023-10-14 ENCOUNTER — Other Ambulatory Visit (HOSPITAL_BASED_OUTPATIENT_CLINIC_OR_DEPARTMENT_OTHER): Payer: Self-pay

## 2023-10-14 ENCOUNTER — Other Ambulatory Visit (HOSPITAL_COMMUNITY): Payer: Self-pay

## 2023-10-14 ENCOUNTER — Other Ambulatory Visit: Payer: Self-pay

## 2023-10-14 ENCOUNTER — Encounter: Payer: Self-pay | Admitting: Pharmacist

## 2023-10-15 ENCOUNTER — Other Ambulatory Visit (HOSPITAL_BASED_OUTPATIENT_CLINIC_OR_DEPARTMENT_OTHER): Payer: Self-pay

## 2023-11-27 ENCOUNTER — Encounter: Payer: Self-pay | Admitting: Sports Medicine

## 2023-11-27 DIAGNOSIS — R739 Hyperglycemia, unspecified: Secondary | ICD-10-CM

## 2023-12-05 ENCOUNTER — Other Ambulatory Visit (HOSPITAL_COMMUNITY): Payer: Self-pay

## 2023-12-05 ENCOUNTER — Ambulatory Visit (INDEPENDENT_AMBULATORY_CARE_PROVIDER_SITE_OTHER): Admitting: Sports Medicine

## 2023-12-05 ENCOUNTER — Other Ambulatory Visit (HOSPITAL_BASED_OUTPATIENT_CLINIC_OR_DEPARTMENT_OTHER): Payer: Self-pay

## 2023-12-05 ENCOUNTER — Telehealth: Payer: Self-pay

## 2023-12-05 ENCOUNTER — Encounter: Payer: Self-pay | Admitting: Sports Medicine

## 2023-12-05 VITALS — BP 132/88 | HR 70 | Temp 97.7°F | Resp 20 | Ht 62.0 in | Wt 291.0 lb

## 2023-12-05 DIAGNOSIS — D229 Melanocytic nevi, unspecified: Secondary | ICD-10-CM | POA: Diagnosis not present

## 2023-12-05 DIAGNOSIS — R739 Hyperglycemia, unspecified: Secondary | ICD-10-CM | POA: Diagnosis not present

## 2023-12-05 DIAGNOSIS — Z6841 Body Mass Index (BMI) 40.0 and over, adult: Secondary | ICD-10-CM | POA: Diagnosis not present

## 2023-12-05 DIAGNOSIS — Z0000 Encounter for general adult medical examination without abnormal findings: Secondary | ICD-10-CM

## 2023-12-05 DIAGNOSIS — G43001 Migraine without aura, not intractable, with status migrainosus: Secondary | ICD-10-CM | POA: Diagnosis not present

## 2023-12-05 DIAGNOSIS — E66813 Obesity, class 3: Secondary | ICD-10-CM | POA: Diagnosis not present

## 2023-12-05 MED ORDER — NURTEC 75 MG PO TBDP
1.0000 | ORAL_TABLET | ORAL | 11 refills | Status: AC
  Filled 2023-12-05: qty 15, 30d supply, fill #0

## 2023-12-05 NOTE — Assessment & Plan Note (Signed)
 Suspicious nevus under the bra strap mid back we will bring her back for a 6 mm incisional biopsy.

## 2023-12-05 NOTE — Telephone Encounter (Signed)
 Pharmacy Patient Advocate Encounter   Received notification from CoverMyMeds that prior authorization for Nurtec 75mg  tabs is required/requested.   Insurance verification completed.   The patient is insured through [facility] .   Per test claim: PA required; PA submitted to above mentioned insurance via CoverMyMeds Key/confirmation #/[facility] Status is pending

## 2023-12-05 NOTE — Progress Notes (Signed)
 Subjective:    CC: Annual Physical Exam  HPI:  This patient is here for their annual physical  I reviewed the past medical history, family history, social history, surgical history, and allergies today and no changes were needed.  Please see the problem list section below in epic for further details.  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   DILATATION & CURETTAGE/HYSTEROSCOPY WITH MYOSURE N/A 02/16/2020   Procedure: DILATATION & CURETTAGE/HYSTEROSCOPY WITH MYOSURE;  Surgeon: Nicholaus Burnard HERO, MD;  Location: [facility] OR;  Service: Gynecology;  Laterality: N/A;   DILATION AND CURETTAGE, DIAGNOSTIC / THERAPEUTIC     INTRAUTERINE DEVICE (IUD) INSERTION N/A 02/16/2020   Procedure: INTRAUTERINE DEVICE (IUD) INSERTION;  Surgeon: Nicholaus Burnard HERO, MD;  Location: [facility] OR;  Service: Gynecology;  Laterality: N/A;  IUD coming from inpatient pharmacy   SOFT TISSUE TUMOR RESECTION  06/17/07   chest/ wound reclosure 4/09'   Social History: Social History   Socioeconomic History   Marital status: Married    Spouse name: Not on file   Number of children: Not on file   Years of education: Not on file   Highest education level: Not on file  Occupational History   Not on file  Tobacco Use   Smoking status: Never   Smokeless tobacco: Never  Vaping Use   Vaping status: Never Used  Substance and Sexual Activity   Alcohol use: No   Drug use: No   Sexual activity: Yes    Birth control/protection: Condom  Other Topics Concern   Not on file  Social History Narrative   Not on file   Social Drivers of Health   Financial Resource Strain: Not on file  Food Insecurity: Not on  file  Transportation Needs: Not on file  Physical Activity: Not on file  Stress: Not on file  Social Connections: Not on file   Family History: Family History  Problem Relation Age of Onset   Hypertension Mother    Migraines Mother    Depression Father    Suicidality Father    Allergies: Allergies  Allergen Reactions   Codeine  Itching   Sumatriptan  Other (See Comments)    Palpitations   Medications: See med rec.  Review of Systems: No headache, visual changes, nausea, vomiting, diarrhea, constipation, dizziness, abdominal pain, skin rash, fevers, chills, night sweats, swollen lymph nodes, weight loss, chest pain, body aches, joint swelling, muscle aches, shortness of breath, mood changes, visual or auditory hallucinations.  Objective:    General: Well Developed, well nourished, and in no acute distress.  Neuro: Alert and oriented x3, extra-ocular muscles intact, sensation grossly intact. Cranial nerves II through XII are intact, motor, sensory, and coordinative functions are all intact. HEENT: Normocephalic, atraumatic, pupils equal round reactive to light, neck supple, no masses, no lymphadenopathy, thyroid  nonpalpable. Oropharynx, nasopharynx, external ear canals are unremarkable. Skin: Warm and dry, no rashes noted.  There is a 1 to 2 cm variegated irregular nevus under the bra strap in the midline. Cardiac: Regular rate and rhythm, no murmurs rubs or gallops.  Respiratory: Clear to auscultation bilaterally. Not  using accessory muscles, speaking in full sentences.  Abdominal: Soft, nontender, nondistended, positive bowel sounds, no masses, no organomegaly.  Musculoskeletal: Shoulder, elbow, wrist, hip, knee, ankle stable, and with full range of motion.  Impression and Recommendations:    The patient was counselled, risk factors were discussed, anticipatory guidance given.  Annual physical exam Annual physical as above, labs were done today, up-to-date on  screenings. Return to see me in 1 year for this.  Suspicious nevus Suspicious nevus under the bra strap mid back we will bring her back for a 6 mm incisional biopsy.  Obesity We revisited weight, we have set her up with a behavioral therapist to work on her eating disorder, we did discuss semaglutide in the past, we discussed her's appetite compounded today as, health does not cover weight loss GLP-1's. Her husband has done really well with his bariatric surgery and has lost 200 pounds. We are planning to see today, she can message me for compounded tirzepatide if she decides to proceed.  Migraine without aura Did really well with Trokendi , she was having approximately 1 headache day per month, Trokendi  no longer covered, did not tolerate immediate release topiramate . Due to over 6 migraine/headache days per month we will add Nurtec, she can continue rizatriptan  for migraine abortion, return to see me a month after starting Nurtec to reevaluate.  ____________________________________________ Debby PARAS. Curtis, M.D., ABFM., CAQSM., AME. Primary Care and Sports Medicine [facility]  Adjunct Professor of [facility] Medicine  [employer]  Restaurant manager, fast food

## 2023-12-05 NOTE — Telephone Encounter (Signed)
 Pharmacy Patient Advocate Encounter  Received notification from [facility] that Prior Authorization for Nurtec 75mg  tabs has been APPROVED from 12/05/23 to 06/02/24   PA #/Case ID/Reference #: [id number]  Approval letter indexed to media tab.

## 2023-12-05 NOTE — Assessment & Plan Note (Signed)
 We revisited weight, we have set her up with a behavioral therapist to work on her eating disorder, we did discuss semaglutide in the past, we discussed her's appetite compounded today as, health does not cover weight loss GLP-1's. Her husband has done really well with his bariatric surgery and has lost 200 pounds. We are planning to see today, she can message me for compounded tirzepatide if she decides to proceed.

## 2023-12-05 NOTE — Assessment & Plan Note (Signed)
 Annual physical as above, labs were done today, up-to-date on screenings. Return to see me in 1 year for this.

## 2023-12-05 NOTE — Assessment & Plan Note (Signed)
 Did really well with Trokendi , she was having approximately 1 headache day per month, Trokendi  no longer covered, did not tolerate immediate release topiramate . Due to over 6 migraine/headache days per month we will add Nurtec, she can continue rizatriptan  for migraine abortion, return to see me a month after starting Nurtec to reevaluate.

## 2023-12-06 ENCOUNTER — Other Ambulatory Visit: Payer: Self-pay | Admitting: Sports Medicine

## 2023-12-06 ENCOUNTER — Other Ambulatory Visit (HOSPITAL_BASED_OUTPATIENT_CLINIC_OR_DEPARTMENT_OTHER): Payer: Self-pay

## 2023-12-06 LAB — COMPREHENSIVE METABOLIC PANEL WITH GFR
ALT: 27 IU/L (ref 0–32)
AST: 26 IU/L (ref 0–40)
Albumin: 4.3 g/dL (ref 3.9–4.9)
Alkaline Phosphatase: 145 IU/L — ABNORMAL HIGH (ref 44–121)
BUN/Creatinine Ratio: 15 (ref 9–23)
BUN: 11 mg/dL (ref 6–24)
Bilirubin Total: 0.7 mg/dL (ref 0.0–1.2)
CO2: 22 mmol/L (ref 20–29)
Calcium: 9.1 mg/dL (ref 8.7–10.2)
Chloride: 97 mmol/L (ref 96–106)
Creatinine, Ser: 0.75 mg/dL (ref 0.57–1.00)
Globulin, Total: 2.8 g/dL (ref 1.5–4.5)
Glucose: 95 mg/dL (ref 70–99)
Potassium: 3.8 mmol/L (ref 3.5–5.2)
Sodium: 136 mmol/L (ref 134–144)
Total Protein: 7.1 g/dL (ref 6.0–8.5)
eGFR: 99 mL/min/1.73 (ref >59)

## 2023-12-06 LAB — LIPID PANEL
Chol/HDL Ratio: 4.3 ratio (ref 0.0–4.4)
Cholesterol, Total: 169 mg/dL (ref 100–199)
HDL: 39 mg/dL — ABNORMAL LOW (ref >39)
LDL Chol Calc (NIH): 111 mg/dL — ABNORMAL HIGH (ref 0–99)
Triglycerides: 105 mg/dL (ref 0–149)
VLDL Cholesterol Cal: 19 mg/dL (ref 5–40)

## 2023-12-06 LAB — CBC
Hematocrit: 43.9 % (ref 34.0–46.6)
Hemoglobin: 14 g/dL (ref 11.1–15.9)
MCH: 26.2 pg — ABNORMAL LOW (ref 26.6–33.0)
MCHC: 31.9 g/dL (ref 31.5–35.7)
MCV: 82 fL (ref 79–97)
Platelets: 311 x10E3/uL (ref 150–450)
RBC: 5.35 x10E6/uL — ABNORMAL HIGH (ref 3.77–5.28)
RDW: 13.2 % (ref 11.7–15.4)
WBC: 7.2 x10E3/uL (ref 3.4–10.8)

## 2023-12-06 LAB — HEMOGLOBIN A1C
Est. average glucose Bld gHb Est-mCnc: 108 mg/dL
Hgb A1c MFr Bld: 5.4 % (ref 4.8–5.6)

## 2023-12-06 LAB — TSH: TSH: 4.53 u[IU]/mL — ABNORMAL HIGH (ref 0.450–4.500)

## 2023-12-06 MED ORDER — HYDROXYZINE HCL 50 MG PO TABS
50.0000 mg | ORAL_TABLET | Freq: Every day | ORAL | 3 refills | Status: AC
  Filled 2023-12-06: qty 90, 90d supply, fill #0
  Filled 2024-04-01: qty 90, 90d supply, fill #1

## 2023-12-10 ENCOUNTER — Other Ambulatory Visit (HOSPITAL_BASED_OUTPATIENT_CLINIC_OR_DEPARTMENT_OTHER): Payer: Self-pay

## 2023-12-10 ENCOUNTER — Telehealth: Payer: Self-pay

## 2023-12-10 NOTE — Telephone Encounter (Signed)
 Left message asking about a recent mammogram.

## 2023-12-13 ENCOUNTER — Other Ambulatory Visit (HOSPITAL_BASED_OUTPATIENT_CLINIC_OR_DEPARTMENT_OTHER): Payer: Self-pay

## 2023-12-13 ENCOUNTER — Encounter: Payer: Self-pay | Admitting: Sports Medicine

## 2023-12-13 ENCOUNTER — Ambulatory Visit: Admitting: Sports Medicine

## 2023-12-13 DIAGNOSIS — D225 Melanocytic nevi of trunk: Secondary | ICD-10-CM | POA: Diagnosis not present

## 2023-12-13 DIAGNOSIS — D229 Melanocytic nevi, unspecified: Secondary | ICD-10-CM

## 2023-12-13 MED ORDER — HYDROCODONE-ACETAMINOPHEN 5-325 MG PO TABS
1.0000 | ORAL_TABLET | Freq: Three times a day (TID) | ORAL | 0 refills | Status: AC | PRN
  Filled 2023-12-13: qty 15, 5d supply, fill #0

## 2023-12-13 NOTE — Patient Instructions (Signed)
Incision Care, Adult An incision is a surgical cut that is made through your skin. Most incisions are closed after a surgical procedure. Your incision may be closed with stitches (sutures), staples, skin glue, or adhesive strips. You may need to return to your health care provider to have sutures or staples removed. This may occur several days or several weeks after your surgery. Until then, the incision needs to be cared for properly to prevent infection. Follow instructions from your health care provider about how to care for your incision. Supplies needed: Soap, water, and a clean hand towel. Wound cleanser. A clean bandage (dressing), if needed. Cream or ointment, if told by your health care provider. Clean gauze. How to care for your incision Cleaning the incision Ask your health care provider how to clean the incision. This may include: Wearing medical gloves. Using mild soap and water, or wound cleanser. Using a clean gauze to pat the incision dry after cleaning it. Dressing changes Wash your hands with soap and water for at least 20 seconds before and after you change the dressing. If soap and water are not available, use hand sanitizer. Do not use disinfectants or antiseptics, such as rubbing alcohol, to clean open wounds unless told by your health care provider. Change your dressing as told by your health care provider. Leave sutures, staples, skin glue, or adhesive strips in place. These skin closures may need to stay in place for 2 weeks or longer. If adhesive strip edges start to loosen and curl up, you may trim the loose edges. Do not remove adhesive strips completely unless your health care provider tells you to do that. Apply cream or ointment. Do this only as told by your health care provider. Cover the incision with a clean dressing. Ask your health care provider when you can start to leave the incision uncovered. Checking for infection Check your incision area every day for  signs of infection. Check for: More redness, swelling, or pain. More fluid or blood. New warmth, hardness, or a rash that develops along the incision. Pus or a bad smell.  Follow these instructions at home Medicines Take over-the-counter and prescription medicines only as told by your health care provider. If you were prescribed an antibiotic medicine, cream, or ointment, take or apply it as told by your health care provider. Do not stop using the antibiotic even if your condition improves. Eating and drinking Eat a diet that includes protein, vitamin A, vitamin C, and other nutrient-rich foods to help the wound heal. Foods rich in protein include meat, fish, eggs, dairy, beans, nuts, and protein supplement drinks. Foods rich in vitamin A include carrots and dark green, leafy vegetables. Foods rich in vitamin C include citrus fruits, tomatoes, broccoli, and peppers. Drink enough fluid to keep your urine pale yellow. General instructions  Do not take baths, swim, use a hot tub, or do anything that would put the incision underwater until your health care provider approves. Ask your health care provider if you may take showers. You may only be allowed to take sponge baths. Limit movement around your incision to promote healing. Avoid straining, lifting, or exercising for the first 2 weeks after your procedure, or for as long as told by your health care provider. Return to your normal activities as told by your health care provider. Ask your health care provider what activities are safe for you. Do not scratch, pick, or scrub the incision. Keep it covered as told by your health care provider.   Protect your incision from the sun when you are outside for the first 6 months, or for as long as told by your health care provider. Cover up the scar area or apply sunscreen that has an SPF of at least 30. Do not use any products that contain nicotine or tobacco, such as cigarettes, e-cigarettes, and  chewing tobacco. These can delay incision healing. If you need help quitting, ask your health care provider. Keep all follow-up visits. This is important. Contact a health care provider if: You have any of these signs of infection: More redness, swelling, or pain around your incision. More fluid or blood coming from your incision. New warmth or hardness around your incision. Pus or a bad smell coming from your incision. A rash that develops along the incision. You feel nauseous or you vomit. You are dizzy. Your sutures, staples, skin glue, or adhesive strips come undone. Your wound gets bigger. You have a fever. Get help right away if: Your incision bleeds through the dressing and the bleeding does not stop with gentle pressure. The edges of your incision open up and separate. These symptoms may represent a serious problem that is an emergency. Do not wait to see if the symptoms will go away. Get medical help right away. Call your local emergency services (911 in the [place] Do not drive yourself to the hospital. Summary Follow instructions from your health care provider about how to care for your incision. Wash your hands with soap and water for at least 20 seconds before and after you change the dressing. If soap and water are not available, use hand sanitizer. Check your incision area every day for signs of infection. Keep all follow-up visits. This is important. This information is not intended to replace advice given to you by your health care provider. Make sure you discuss any questions you have with your health care provider. Document Revised: 07/18/2020 Document Reviewed: 07/18/2020 Elsevier Patient Education  2024 Elsevier Inc.  

## 2023-12-13 NOTE — Assessment & Plan Note (Signed)
 Full surgical excision of a 3 cm variegated suspicious nevus mid back. Suture removal will be in 10 days, her husband is a nurse and he can remove the stitches. Hydrocodone  for postoperative pain.

## 2023-12-13 NOTE — Progress Notes (Signed)
    Procedures performed today:    Procedure:  Excision of  mid back 3cm suspicious nevus. Risks, benefits, and alternatives explained and consent obtained. Time out conducted. Surface prepped with chlorhexidine  5cc lidocaine  with epinephine infiltrated in a field block. Adequate anesthesia ensured. Area prepped and draped in a sterile fashion. Excision performed with: Using a #10 blade I made an elliptical incision around the nevus leaving a 1 to 2 mm margin.  A full-thickness dissection was made, the skin specimen was sent to dermatopathology, after gently undermining the edges of the skin to reduce tension I was able to close the entire defect with five 3-0 simple interrupted Ethilon sutures. Hemostasis achieved. Pt stable.  Independent interpretation of notes and tests performed by another provider:   None.  Brief History, Exam, Impression, and Recommendations:    Suspicious nevus Full surgical excision of a 3 cm variegated suspicious nevus mid back. Suture removal will be in 10 days, her husband is a nurse and he can remove the stitches. Hydrocodone  for postoperative pain.    ____________________________________________ Debby PARAS. Curtis, M.D., ABFM., CAQSM., AME. Primary Care and Sports Medicine [facility]  Adjunct Professor of [facility]  [employer]  Restaurant manager, [employer]

## 2023-12-17 LAB — DERMATOLOGY PATHOLOGY

## 2023-12-25 ENCOUNTER — Other Ambulatory Visit: Payer: Self-pay

## 2023-12-25 MED FILL — Trazodone HCl Tab 50 MG: ORAL | 90 days supply | Qty: 90 | Fill #1 | Status: AC

## 2023-12-26 ENCOUNTER — Other Ambulatory Visit (HOSPITAL_BASED_OUTPATIENT_CLINIC_OR_DEPARTMENT_OTHER): Payer: Self-pay

## 2023-12-27 ENCOUNTER — Other Ambulatory Visit: Payer: Self-pay

## 2023-12-27 ENCOUNTER — Other Ambulatory Visit (HOSPITAL_BASED_OUTPATIENT_CLINIC_OR_DEPARTMENT_OTHER): Payer: Self-pay

## 2023-12-27 DIAGNOSIS — K219 Gastro-esophageal reflux disease without esophagitis: Secondary | ICD-10-CM

## 2023-12-27 DIAGNOSIS — F332 Major depressive disorder, recurrent severe without psychotic features: Secondary | ICD-10-CM

## 2023-12-27 MED ORDER — RIZATRIPTAN BENZOATE 5 MG PO TBDP
5.0000 mg | ORAL_TABLET | ORAL | 2 refills | Status: AC | PRN
Start: 2023-12-27 — End: ?
  Filled 2023-12-27: qty 10, 30d supply, fill #0

## 2023-12-27 MED ORDER — VENLAFAXINE HCL ER 75 MG PO CP24
225.0000 mg | ORAL_CAPSULE | Freq: Every day | ORAL | 3 refills | Status: AC
  Filled 2023-12-27: qty 270, 90d supply, fill #0
  Filled 2024-04-01: qty 270, 90d supply, fill #1

## 2023-12-27 MED ORDER — PANTOPRAZOLE SODIUM 40 MG PO TBEC
40.0000 mg | DELAYED_RELEASE_TABLET | Freq: Every day | ORAL | 3 refills | Status: AC
  Filled 2023-12-27: qty 90, 90d supply, fill #0

## 2023-12-31 ENCOUNTER — Encounter: Payer: Self-pay | Admitting: Sports Medicine

## 2024-01-02 ENCOUNTER — Telehealth: Admitting: Sports Medicine

## 2024-01-17 ENCOUNTER — Other Ambulatory Visit: Payer: Self-pay

## 2024-01-17 ENCOUNTER — Other Ambulatory Visit (HOSPITAL_BASED_OUTPATIENT_CLINIC_OR_DEPARTMENT_OTHER): Payer: Self-pay

## 2024-01-20 ENCOUNTER — Other Ambulatory Visit (HOSPITAL_BASED_OUTPATIENT_CLINIC_OR_DEPARTMENT_OTHER): Payer: Self-pay

## 2024-03-03 ENCOUNTER — Other Ambulatory Visit (HOSPITAL_BASED_OUTPATIENT_CLINIC_OR_DEPARTMENT_OTHER): Payer: Self-pay

## 2024-04-01 ENCOUNTER — Other Ambulatory Visit: Payer: Self-pay

## 2024-04-02 ENCOUNTER — Other Ambulatory Visit: Payer: Self-pay

## 2024-05-25 ENCOUNTER — Other Ambulatory Visit (HOSPITAL_COMMUNITY): Payer: Self-pay

## 2024-05-25 ENCOUNTER — Other Ambulatory Visit (HOSPITAL_BASED_OUTPATIENT_CLINIC_OR_DEPARTMENT_OTHER): Payer: Self-pay

## 2024-05-25 ENCOUNTER — Encounter (HOSPITAL_BASED_OUTPATIENT_CLINIC_OR_DEPARTMENT_OTHER): Payer: Self-pay

## 2024-05-25 ENCOUNTER — Telehealth: Payer: Self-pay

## 2024-05-25 NOTE — Telephone Encounter (Signed)
 Copied from CRM #[id number]. Topic: Appointments - Transfer of Care >> May 25, 2024  2:11 PM Donna BRAVO wrote: Pt is requesting to transfer FROM: Dr. Curtis MD Pt is requesting to transfer TO: [facility] Reason for requested transfer: provider no longer a office  It is the responsibility of the team the patient would like to transfer to [place] [facility] to reach out to the patient if for any reason this transfer is not acceptable.

## 2024-05-28 ENCOUNTER — Other Ambulatory Visit (HOSPITAL_COMMUNITY): Payer: Self-pay

## 2024-08-07 ENCOUNTER — Encounter: Admitting: Urgent Care
